# Patient Record
Sex: Female | Born: 1965 | Race: Black or African American | Hispanic: No | Marital: Single | Smoking: Never smoker
Health system: Southern US, Community
[De-identification: ages and names within clinical notes are randomized; demographics above are authoritative.]

## PROBLEM LIST (undated history)

## (undated) DIAGNOSIS — R200 Anesthesia of skin: Secondary | ICD-10-CM

## (undated) DIAGNOSIS — R51 Headache: Secondary | ICD-10-CM

## (undated) DIAGNOSIS — K222 Esophageal obstruction: Secondary | ICD-10-CM

## (undated) DIAGNOSIS — J40 Bronchitis, not specified as acute or chronic: Secondary | ICD-10-CM

## (undated) DIAGNOSIS — K449 Diaphragmatic hernia without obstruction or gangrene: Secondary | ICD-10-CM

## (undated) DIAGNOSIS — K644 Residual hemorrhoidal skin tags: Secondary | ICD-10-CM

## (undated) DIAGNOSIS — I10 Essential (primary) hypertension: Secondary | ICD-10-CM

## (undated) DIAGNOSIS — M1990 Unspecified osteoarthritis, unspecified site: Secondary | ICD-10-CM

## (undated) DIAGNOSIS — R202 Paresthesia of skin: Secondary | ICD-10-CM

## (undated) DIAGNOSIS — N189 Chronic kidney disease, unspecified: Secondary | ICD-10-CM

## (undated) DIAGNOSIS — E119 Type 2 diabetes mellitus without complications: Secondary | ICD-10-CM

## (undated) DIAGNOSIS — R519 Headache, unspecified: Secondary | ICD-10-CM

## (undated) DIAGNOSIS — K219 Gastro-esophageal reflux disease without esophagitis: Secondary | ICD-10-CM

## (undated) DIAGNOSIS — D509 Iron deficiency anemia, unspecified: Secondary | ICD-10-CM

## (undated) HISTORY — DX: Iron deficiency anemia, unspecified: D50.9

## (undated) HISTORY — DX: Esophageal obstruction: K22.2

## (undated) HISTORY — PX: CHOLECYSTECTOMY: SHX55

## (undated) HISTORY — DX: Headache, unspecified: R51.9

## (undated) HISTORY — DX: Chronic kidney disease, unspecified: N18.9

## (undated) HISTORY — DX: Diaphragmatic hernia without obstruction or gangrene: K44.9

## (undated) HISTORY — DX: Anesthesia of skin: R20.2

## (undated) HISTORY — PX: TUBAL LIGATION: SHX77

## (undated) HISTORY — DX: Headache: R51

## (undated) HISTORY — DX: Residual hemorrhoidal skin tags: K64.4

## (undated) HISTORY — DX: Anesthesia of skin: R20.0

## (undated) HISTORY — PX: ROTATOR CUFF REPAIR: SHX139

---
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
## 1998-04-28 ENCOUNTER — Emergency Department (HOSPITAL_COMMUNITY): Admission: EM | Admit: 1998-04-28 | Discharge: 1998-04-28 | Payer: Self-pay | Admitting: Emergency Medicine

## 1998-04-28 ENCOUNTER — Encounter: Payer: Self-pay | Admitting: Emergency Medicine

## 1999-07-20 ENCOUNTER — Encounter: Payer: Self-pay | Admitting: Emergency Medicine

## 1999-07-20 ENCOUNTER — Emergency Department (HOSPITAL_COMMUNITY): Admission: EM | Admit: 1999-07-20 | Discharge: 1999-07-20 | Payer: Self-pay | Admitting: Emergency Medicine

## 2000-07-25 ENCOUNTER — Encounter (INDEPENDENT_AMBULATORY_CARE_PROVIDER_SITE_OTHER): Payer: Self-pay | Admitting: Specialist

## 2000-07-25 ENCOUNTER — Encounter: Admission: RE | Admit: 2000-07-25 | Discharge: 2000-07-25 | Payer: Self-pay | Admitting: Obstetrics and Gynecology

## 2000-09-08 ENCOUNTER — Inpatient Hospital Stay (HOSPITAL_COMMUNITY): Admission: AD | Admit: 2000-09-08 | Discharge: 2000-09-08 | Payer: Self-pay | Admitting: Obstetrics and Gynecology

## 2000-09-17 ENCOUNTER — Inpatient Hospital Stay (HOSPITAL_COMMUNITY): Admission: AD | Admit: 2000-09-17 | Discharge: 2000-09-17 | Payer: Self-pay | Admitting: Obstetrics and Gynecology

## 2000-09-22 ENCOUNTER — Inpatient Hospital Stay (HOSPITAL_COMMUNITY): Admission: AD | Admit: 2000-09-22 | Discharge: 2000-09-26 | Payer: Self-pay | Admitting: Obstetrics and Gynecology

## 2000-11-12 ENCOUNTER — Other Ambulatory Visit: Admission: RE | Admit: 2000-11-12 | Discharge: 2000-11-12 | Payer: Self-pay | Admitting: Obstetrics and Gynecology

## 2002-11-07 ENCOUNTER — Emergency Department (HOSPITAL_COMMUNITY): Admission: EM | Admit: 2002-11-07 | Discharge: 2002-11-07 | Payer: Self-pay | Admitting: Emergency Medicine

## 2003-08-25 ENCOUNTER — Ambulatory Visit (HOSPITAL_COMMUNITY): Admission: RE | Admit: 2003-08-25 | Discharge: 2003-08-25 | Payer: Self-pay | Admitting: Family Medicine

## 2006-06-03 ENCOUNTER — Ambulatory Visit (HOSPITAL_COMMUNITY): Admission: RE | Admit: 2006-06-03 | Discharge: 2006-06-03 | Payer: Self-pay | Admitting: Family Medicine

## 2006-06-12 ENCOUNTER — Encounter: Admission: RE | Admit: 2006-06-12 | Discharge: 2006-06-12 | Payer: Self-pay | Admitting: Gastroenterology

## 2010-01-07 ENCOUNTER — Emergency Department (HOSPITAL_COMMUNITY): Admission: EM | Admit: 2010-01-07 | Discharge: 2010-01-08 | Payer: Self-pay | Admitting: Emergency Medicine

## 2010-05-01 LAB — COMPREHENSIVE METABOLIC PANEL
ALT: 18 U/L (ref 0–35)
AST: 28 U/L (ref 0–37)
Albumin: 3.8 g/dL (ref 3.5–5.2)
Alkaline Phosphatase: 69 U/L (ref 39–117)
BUN: 8 mg/dL (ref 6–23)
CO2: 26 mEq/L (ref 19–32)
Calcium: 9 mg/dL (ref 8.4–10.5)
Chloride: 106 mEq/L (ref 96–112)
Creatinine, Ser: 1.26 mg/dL — ABNORMAL HIGH (ref 0.4–1.2)
GFR calc Af Amer: 56 mL/min — ABNORMAL LOW (ref >60)
GFR calc non Af Amer: 46 mL/min — ABNORMAL LOW (ref >60)
Glucose, Bld: 117 mg/dL — ABNORMAL HIGH (ref 70–99)
Potassium: 4.1 mEq/L (ref 3.5–5.1)
Sodium: 139 mEq/L (ref 135–145)
Total Bilirubin: 0.5 mg/dL (ref 0.3–1.2)
Total Protein: 7.4 g/dL (ref 6.0–8.3)

## 2010-05-01 LAB — URINALYSIS, ROUTINE W REFLEX MICROSCOPIC
Glucose, UA: NEGATIVE mg/dL
Hgb urine dipstick: NEGATIVE
Ketones, ur: 15 mg/dL — AB
Nitrite: NEGATIVE
Protein, ur: NEGATIVE mg/dL
Specific Gravity, Urine: 1.027 (ref 1.005–1.030)
Urobilinogen, UA: 1 mg/dL (ref 0.0–1.0)
pH: 6 (ref 5.0–8.0)

## 2010-05-01 LAB — DIFFERENTIAL
Basophils Absolute: 0 10*3/uL (ref 0.0–0.1)
Basophils Relative: 0 % (ref 0–1)
Eosinophils Absolute: 0.1 10*3/uL (ref 0.0–0.7)
Eosinophils Relative: 1 % (ref 0–5)
Lymphocytes Relative: 25 % (ref 12–46)
Lymphs Abs: 2.2 10*3/uL (ref 0.7–4.0)
Monocytes Absolute: 0.6 10*3/uL (ref 0.1–1.0)
Monocytes Relative: 7 % (ref 3–12)
Neutro Abs: 5.9 10*3/uL (ref 1.7–7.7)
Neutrophils Relative %: 67 % (ref 43–77)

## 2010-05-01 LAB — CBC
HCT: 39.9 % (ref 36.0–46.0)
Hemoglobin: 12.7 g/dL (ref 12.0–15.0)
MCH: 25.3 pg — ABNORMAL LOW (ref 26.0–34.0)
MCHC: 31.8 g/dL (ref 30.0–36.0)
MCV: 79.6 fL (ref 78.0–100.0)
Platelets: 270 10*3/uL (ref 150–400)
RBC: 5.01 MIL/uL (ref 3.87–5.11)
RDW: 14.5 % (ref 11.5–15.5)
WBC: 8.9 10*3/uL (ref 4.0–10.5)

## 2010-05-01 LAB — POCT PREGNANCY, URINE: Preg Test, Ur: NEGATIVE

## 2010-05-01 LAB — LIPASE, BLOOD: Lipase: 20 U/L (ref 11–59)

## 2010-07-06 NOTE — Discharge Summary (Signed)
[facility]  Patient:    Gabriela Lowe, Gabriela Lowe                    MRN: [id number] Adm. Date:  [id number] Disc. Date: [id number] Attending:  Rhina Brackett Dictator:   Danie Chandler, R.N.                           Discharge Summary  ADMITTING DIAGNOSES:          1. A 37-5/[redacted] week gestation.                               2. Previous cesarean section.                               3. Declines vaginal birth after cesarean.                               4. Labor.                               5. Requests tubal ligation.  DISCHARGE DIAGNOSES:          1. A 37-5/[redacted] week gestation.                               2. Previous cesarean section.                               3. Declines vaginal birth after cesarean.                               4. Labor.                               5. Requests tubal ligation.  PROCEDURE ON AUGUST 5,2002:   1. Repeat low transverse cesarean section.                               2. Modified Pomeroy tubal ligation.  REASON FOR ADMISSION:         Please see history and physical.  HOSPITAL COURSE:              The patient was taken to the operating room and underwent the above-named procedure without complication. This was productive of a viable female infant with Apgars of 9 at one minute and 9 at five minutes.  Postoperatively on day #1 the patient had good control of pain. Her hemoglobin was 9.9, hematocrit 28.6, and white blood cell count 9.7, and platelets were 147,000.  On postoperative day #2 the patient had a good return of bowel function, was tolerating a regular diet. Her CBGs were basically within normal limits. The baby was in NICU on antibiotics and stable.  On postoperative day #3 the patient had better pain control. Her CBGs remained basically within normal limits and she was discharged home on postoperative day #4.  CONDITION ON DISCHARGE:  Good.  DISCHARGE INSTRUCTIONS:       Diet: Regular as  tolerated. Activity: No heavy lifting, no driving, no vaginal entry.  DISCHARGE FOLLOWUP:           She is to follow up in the office in one to two weeks for incision check and she is to call for temperature greater than 100 degrees, persistent nausea or vomiting, heavy vaginal bleeding, and/or redness or drainage from the incision site.  DISCHARGE MEDICATIONS:        1. Prenatal vitamin one p.o. q.d.                               2. Pain medications as directed by M.D. DD:  09/26/00 TD:  09/26/00 Job: [id number]

## 2010-07-06 NOTE — Op Note (Signed)
[facility]  Patient:    Gabriela Lowe, Gabriela Lowe                    MRN: [id number] Proc. Date: 09/22/00 Attending:  Frederich Balding                           Operative Report  PREOPERATIVE DIAGNOSES:       1. A 37-5/[redacted] weeks gestation.                               2. Previous cesarean section.                               3. Declines vaginal birth after cesarean.                               4. Labor.                               5. Requests tubal ligation.  POSTOPERATIVE DIAGNOSES:      1. A 37-5/[redacted] weeks gestation.                               2. Previous cesarean section.                               3. Declines vaginal birth after cesarean.                               4. Labor.                               5. Requests tubal ligation.  PROCEDURE:                    1. Repeat low transverse cesarean section.                               2. Modified Pomeroy tubal ligation.  SURGEON:                      Duke Salvia. Marcelle Overlie, M.D.  ANESTHESIA:                   Spinal.  COMPLICATIONS:                None.  DRAINS:                       Foley catheter.  ESTIMATED BLOOD LOSS:         800 cc.  PROCEDURE AND FINDINGS:       The patient was taken to the operating room. After an adequate level of spinal anesthetic was obtained, with the patient supine in left tilt position, the abdomen was prepped and draped in the usual manner for sterile abdominal procedures. A transverse incision was made through the old scar which was well healed. A Foley  catheter had been positioned previously. This was carried down to the fascia which was incised then extended transversely. Rectus muscles were divided in the midline, peritoneum entered superiorly without incident, and extended in a vertical manner. The vesicouterine serosa was then incised and the bladder was bluntly and sharply dissected off of the lower uterine segment. The bladder blade  was positioned. A transverse incision was made in the lower uterine segment, extended with blunt dissection, clear fluid noted, and the patient then delivered of a 9-pound 2-ounce female, Apgars 9 and 9. The infant was suctioned, the cord clamped, and the infant was passed to the pediatric team for further care. The placenta delivered manually, intact. The uterus was exteriorized and the cavity wiped clean with laparotomy pack. Closure obtained with a first layer of 0 chromic in a locked fashion followed by an imbricating layer of 0 chromic. This was hemostatic. Tubes and ovaries appeared to be normal. Starting on the right, the right tube was grasped in mid segment with a Babcock clamp and o plain sutures were then tied around each end of a mid segment knuckle of tube which was then excised. Cut ends were cauterized with a Bovie. Sutures of 2-0 silk were placed around the proximal segment. This was hemostatic. The exact same was repeated on the opposite side. Prior to closure, sponge, needle, and instrument counts were reported as correct x 2. The rectus muscles were reapproximated with 2-0 Dexon interrupted sutures. The fascia was closed from laterally to midline on either side with a 0 PDS suture. Subcutaneous fat was hemostatic. Clips and Steri-Strips were applied and pressure dressing applied. She did received Pitocin IV and Ancef 1 g IV after the cord was clamped. Clear urine was noted at the end of the case. DD:  09/22/00 TD:  09/23/00 Job: [id number]

## 2010-07-06 NOTE — H&P (Signed)
[facility]  Patient:    Gabriela Lowe, Gabriela Lowe                        MRN: [id number] Adm. Date:  09/22/00 Attending:  Duke Salvia. Marcelle Overlie, M.D.                         History and Physical  CHIEF COMPLAINT:              Labor.  HISTORY OF PRESENT ILLNESS:   A 45 year old, G3, P1-0-1-1, scheduled for repeat cesarean section presents in early labor today. She is extremely uncomfortable. She declines VBAC. She is admitted now for repeat cesarean section and tubal ligation. This procedure, including risks relative to bleeding, infection, adjacent organ injury, the possible need for transfusion, reviewed. The permanence of the tubal procedure and failure rate of 2 to 3 per 1000 discussed with her. This pregnancy has been complicated by increased Trisomy 21 risk based on an AFP. She declined amniocentesis. The level II ultrasound at [facility] did not show any increased Trisomy 21 markers. She does have a history of chronic hypertension and ______ blood pressure was 140/80. She has been on Aldomet 250 mg p.o. b.i.d. Her one-hour GTT was abnormal. Group B strep screen was negative. Three-hour GTT showed a normal fasting of 102, one hour was 250, two hours was 195, and three hour was 86 consistent with gestational diabetes. She has been a low carbohydrate diet and her blood sugar control has been reasonably normal without insulin.  PAST MEDICAL HISTORY:  ALLERGIES:                    None.  OPERATIONS:                   Cesarean section 1992, 9-pound 1-ounce female.  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  PHYSICAL EXAMINATION:  VITAL SIGNS:                  Temperature 98.2, blood pressure 140/72.  HEENT:                        Unremarkable.  NECK:                         Supple without masses.  LUNGS:                        Clear.  CARDIOVASCULAR:               Regular rate and  rhythm without murmurs, rubs, gallops.  BREASTS:                      Not examined.  ABDOMEN:                      Term fundal height. Fetal heart rate 140.  PELVIC:                       Cervix was less than 1 cm.  Membranes intact.  EXTREMITIES:                  Unremarkable.  NEUROLOGIC:  Unremarkable.  IMPRESSION:                   1. A 37-5/[redacted] week gestation.                               2. Early active labor.                               3. Previous cesarean section, declines vaginal                                  birth after cesarean section.  PLAN:                         Repeat cesarean section and tubal ligation. Procedure and risks discussed as above. DD:  09/22/00 TD:  09/22/00 Job: [id number]

## 2011-11-01 ENCOUNTER — Emergency Department (HOSPITAL_COMMUNITY)
Admission: EM | Admit: 2011-11-01 | Discharge: 2011-11-01 | Disposition: A | Discharge status: Home / Self Care | Payer: Medicaid Other | Attending: Emergency Medicine | Admitting: Emergency Medicine

## 2011-11-01 ENCOUNTER — Encounter (HOSPITAL_COMMUNITY): Payer: Self-pay | Admitting: Emergency Medicine

## 2011-11-01 DIAGNOSIS — I10 Essential (primary) hypertension: Secondary | ICD-10-CM | POA: Insufficient documentation

## 2011-11-01 DIAGNOSIS — M25579 Pain in unspecified ankle and joints of unspecified foot: Secondary | ICD-10-CM | POA: Insufficient documentation

## 2011-11-01 HISTORY — DX: Essential (primary) hypertension: I10

## 2011-11-01 MED ORDER — IBUPROFEN 600 MG PO TABS: 600.0000 mg | ORAL_TABLET | Freq: Four times a day (QID) | ORAL | Status: AC | PRN

## 2011-11-01 NOTE — ED Notes (Signed)
Pt reports 24 hr hx of ankle swelling and pain

## 2011-11-01 NOTE — ED Provider Notes (Signed)
History     CSN: [id number]  Arrival date & time 11/01/11  0932   First MD Initiated Contact with Patient 11/01/11 [phone]      Chief Complaint  Patient presents with  . Ankle Pain    pt c/o tenderness and swelling in l/ankle. denies trauma    (Consider location/radiation/quality/duration/timing/severity/associated sxs/prior treatment) HPI Comments: Patient presents with complaint of left heel and ankle pain for the past 2 days. 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 There is no injury at the onset. Pain is gradually becoming worse. Pain is made worse with walking and palpation. Pain does not radiate. Patient denies knee pain. She's been taking Tylenol at home and soaking her foot in warm water without relief. Onset was gradual. Course is constant.  Patient is a 46 y.o. female presenting with ankle pain. The history is provided by the patient.  Ankle Pain  The incident occurred 2 days ago. There was no injury mechanism. Pain location: left ankle. The pain is moderate. The pain has been constant since onset. Pertinent negatives include no numbness, no inability to bear weight, no loss of motion, no loss of sensation and no tingling. She reports no foreign bodies present. The symptoms are aggravated by bearing weight. She has tried acetaminophen for the symptoms. The treatment provided no relief.    Past Medical History  Diagnosis Date  . Hypertension     Past Surgical History  Procedure Date  . Cesarean section   . Cholecystectomy     No family history on file.  History  Substance Use Topics  . Smoking status: Not on file  . Smokeless tobacco: Not on file  . Alcohol Use: Not on file    OB History    No data available      Review of Systems  Constitutional: Negative for activity change.  HENT: Negative for neck pain.   Musculoskeletal: Positive for arthralgias and gait problem. Negative for back pain and joint  swelling.  Skin: Negative for wound.  Neurological: Negative for tingling, weakness and numbness.    Allergies  Review of patient's allergies indicates no known allergies.  Home Medications   Current Outpatient Rx  Name Route Sig Dispense Refill  . ACETAMINOPHEN 500 MG PO TABS Oral Take 1,000 mg by mouth every 6 (six) hours as needed. For pain.    Marland Kitchen LISINOPRIL-HYDROCHLOROTHIAZIDE 20-25 MG PO TABS Oral Take 1 tablet by mouth daily.    . ADULT MULTIVITAMIN W/MINERALS CH Oral Take 1 tablet by mouth every other day.      BP 158/82  Pulse 72  Temp 98.1 F (36.7 C) (Oral)  Resp 18  SpO2 100%  LMP 10/17/2011  Physical Exam  Nursing note and vitals reviewed. Constitutional: She appears well-developed and well-nourished.  HENT:  Head: Normocephalic and atraumatic.  Eyes: Pupils are equal, round, and reactive to light.  Neck: Normal range of motion. Neck supple.  Cardiovascular: Exam reveals no decreased pulses.   Pulses:      Dorsalis pedis pulses are 2+ on the right side, and 2+ on the left side.       Posterior tibial pulses are 2+ on the right side, and 2+ on the left side.  Musculoskeletal: She exhibits tenderness. She exhibits no edema.       Left ankle: She exhibits normal range of motion and no swelling. tenderness (heel, posterior distal ankle). Achilles tendon exhibits pain. Achilles  tendon exhibits no defect and normal Thompson's test results.       Patient with tenderness to palpation along the Achilles tendon as well as soft tissues to either side of the tendon.  Neurological: She is alert. No sensory deficit.       Motor, sensation, and vascular distal to the injury is fully intact.   Skin: Skin is warm and dry.  Psychiatric: She has a normal mood and affect.    ED Course  Procedures (including critical care time)  Labs Reviewed - No data to display No results found.   1. Ankle pain     10:19 AM Patient seen and examined.   Vital signs reviewed and are as  follows: Filed Vitals:   11/01/11 1050  BP: 158/82  Pulse:   Temp:   Resp:   BP 158/82  Pulse 72  Temp 98.1 F (36.7 C) (Oral)  Resp 18  SpO2 100%  LMP 10/17/2011   Patient was counseled on RICE protocol and told to rest injury, use ice for no longer than 15 minutes every hour, compress the area, and elevate above the level of their heart as much as possible to reduce swelling.  Questions answered.  Patient verbalized understanding.    Crutches and Ace wrap by ortho tech.  MDM  Patient with ankle pain, no injury. No x-rays ordered because of no significant mechanism of injury. Achilles tendon appears intact. Pain extends medially and laterally from achilles. Suspect inflammation secondary to the patient being up on her feet all day. She is also overweight. RICE protocol indicated for treatment. Will give crutches and ACE wrap per patient request.        Renne Crigler, PA 11/01/11 1343

## 2011-11-01 NOTE — ED Notes (Signed)
Crutch walking reviewed with pt Proper use of ace bandage reviewed

## 2011-11-02 NOTE — ED Provider Notes (Signed)
Medical screening examination/treatment/procedure(s) were performed by non-physician practitioner and as supervising physician I was immediately available for consultation/collaboration.   Kynsley Whitehouse Y. Charnita Trudel, MD 11/02/11 0004 

## 2012-01-24 ENCOUNTER — Other Ambulatory Visit (HOSPITAL_COMMUNITY): Payer: Self-pay | Admitting: Physician Assistant

## 2012-01-24 DIAGNOSIS — Z1231 Encounter for screening mammogram for malignant neoplasm of breast: Secondary | ICD-10-CM

## 2012-02-14 ENCOUNTER — Ambulatory Visit (HOSPITAL_COMMUNITY): Payer: Medicaid Other

## 2012-03-04 ENCOUNTER — Ambulatory Visit (HOSPITAL_COMMUNITY)
Admission: RE | Admit: 2012-03-04 | Discharge: 2012-03-04 | Disposition: A | Discharge status: Home / Self Care | Payer: Medicaid Other | Source: Ambulatory Visit | Attending: Physician Assistant | Admitting: Physician Assistant

## 2012-03-04 DIAGNOSIS — Z1231 Encounter for screening mammogram for malignant neoplasm of breast: Secondary | ICD-10-CM

## 2012-03-06 ENCOUNTER — Other Ambulatory Visit: Payer: Self-pay | Admitting: Physician Assistant

## 2012-03-06 DIAGNOSIS — R928 Other abnormal and inconclusive findings on diagnostic imaging of breast: Secondary | ICD-10-CM

## 2012-03-20 ENCOUNTER — Ambulatory Visit
Admission: RE | Admit: 2012-03-20 | Discharge: 2012-03-20 | Disposition: A | Discharge status: Home / Self Care | Payer: Medicaid Other | Source: Ambulatory Visit | Attending: Physician Assistant | Admitting: Physician Assistant

## 2012-03-20 DIAGNOSIS — R928 Other abnormal and inconclusive findings on diagnostic imaging of breast: Secondary | ICD-10-CM

## 2012-05-01 ENCOUNTER — Ambulatory Visit: Payer: Medicaid Other | Attending: Sports Medicine | Admitting: Physical Therapy

## 2012-05-01 DIAGNOSIS — M25579 Pain in unspecified ankle and joints of unspecified foot: Secondary | ICD-10-CM | POA: Insufficient documentation

## 2012-05-01 DIAGNOSIS — R262 Difficulty in walking, not elsewhere classified: Secondary | ICD-10-CM | POA: Insufficient documentation

## 2012-05-01 DIAGNOSIS — IMO0001 Reserved for inherently not codable concepts without codable children: Secondary | ICD-10-CM | POA: Insufficient documentation

## 2012-05-11 ENCOUNTER — Ambulatory Visit: Payer: Medicaid Other | Admitting: Rehabilitation

## 2012-05-14 ENCOUNTER — Ambulatory Visit: Payer: Medicaid Other | Admitting: Rehabilitation

## 2012-05-19 ENCOUNTER — Ambulatory Visit: Payer: Medicaid Other | Admitting: Rehabilitation

## 2012-08-20 ENCOUNTER — Emergency Department (HOSPITAL_COMMUNITY)
Admission: EM | Admit: 2012-08-20 | Discharge: 2012-08-20 | Disposition: A | Discharge status: Home / Self Care | Payer: Medicaid Other | Source: Home / Self Care | Attending: Emergency Medicine | Admitting: Emergency Medicine

## 2012-08-20 ENCOUNTER — Encounter (HOSPITAL_COMMUNITY): Payer: Self-pay | Admitting: Emergency Medicine

## 2012-08-20 ENCOUNTER — Emergency Department (INDEPENDENT_AMBULATORY_CARE_PROVIDER_SITE_OTHER): Payer: Medicaid Other

## 2012-08-20 DIAGNOSIS — J40 Bronchitis, not specified as acute or chronic: Secondary | ICD-10-CM

## 2012-08-20 DIAGNOSIS — J309 Allergic rhinitis, unspecified: Secondary | ICD-10-CM

## 2012-08-20 MED ORDER — CHLORPHENIRAMINE-PSE-IBUPROFEN 2-30-200 MG PO TABS: ORAL_TABLET | ORAL | Status: DC

## 2012-08-20 MED ORDER — HYDROCOD POLST-CHLORPHEN POLST 10-8 MG/5ML PO LQCR: 5.0000 mL | Freq: Two times a day (BID) | ORAL | Status: DC | PRN

## 2012-08-20 MED ORDER — ANTIPYRINE-BENZOCAINE 5.4-1.4 % OT SOLN: 3.0000 [drp] | OTIC | Status: DC | PRN

## 2012-08-20 MED ORDER — METHYLPREDNISOLONE 4 MG PO KIT: PACK | ORAL | Status: DC

## 2012-08-20 NOTE — ED Notes (Signed)
Waiting for work note

## 2012-08-20 NOTE — ED Provider Notes (Signed)
Medical screening examination/treatment/procedure(s) were performed by non-physician practitioner and as supervising physician I was immediately available for consultation/collaboration.  Leslee Home, M.D.  Reuben Likes, MD 08/20/12 [phone]

## 2012-08-20 NOTE — ED Notes (Signed)
Cough for 2 weeks, upper back pain, body aches, and left ear ache.  Symptoms for 2 weeks .

## 2012-08-20 NOTE — ED Provider Notes (Signed)
History    CSN: [id number] Arrival date & time 08/20/12  4132  First MD Initiated Contact with Patient 08/20/12 0920     Chief Complaint  Patient presents with  . Cough   (Consider location/radiation/quality/duration/timing/severity/associated sxs/prior Treatment) HPI Comments: 47 year old female presents to the urgent care complaining of cough for 2 weeks, body aches, and bilateral ear ache that is worse in the left ear. This began initially as a sore throat followed by nasal congestion and feeling very rundown. These symptoms have resolved, but about 10 days ago the cough started, followed soon by the body aches and the ear ache. The body aches are described as pain in her upper back and her legs, much worse whenever she coughs. The ear ache is described as a pressure that is worse when she lays down at night. She has not had any fever, chills, rash, or sick contacts. She denies recent travel.  Patient is a 47 y.o. female presenting with cough.  Cough Associated symptoms: ear pain, myalgias, rhinorrhea and sore throat   Associated symptoms: no chest pain, no chills, no diaphoresis, no eye discharge, no fever, no headaches, no rash and no shortness of breath    Past Medical History  Diagnosis Date  . Hypertension    Past Surgical History  Procedure Laterality Date  . Cesarean section    . Cholecystectomy     Family History  Problem Relation Age of Onset  . Hypertension Mother   . Diabetes Mother    History  Substance Use Topics  . Smoking status: Never Smoker   . Smokeless tobacco: Not on file  . Alcohol Use: No   OB History   Grav Para Term Preterm Abortions TAB SAB Ect Mult Living                 Review of Systems  Constitutional: Positive for fatigue. Negative for fever, chills and diaphoresis.  HENT: Positive for ear pain, congestion, sore throat, rhinorrhea, postnasal drip and sinus pressure. Negative for hearing loss, sneezing, trouble swallowing, neck pain, neck  stiffness, tinnitus and ear discharge.   Eyes: Negative for pain, discharge and visual disturbance.  Respiratory: Positive for cough. Negative for chest tightness and shortness of breath.   Cardiovascular: Negative for chest pain, palpitations and leg swelling.  Gastrointestinal: Negative for nausea, vomiting, abdominal pain, diarrhea, constipation and blood in stool.  Endocrine: Negative for polydipsia and polyuria.  Genitourinary: Negative for dysuria, urgency and frequency.  Musculoskeletal: Positive for myalgias. Negative for arthralgias.  Skin: Negative for rash.  Neurological: Negative for dizziness, weakness, light-headedness and headaches.    Allergies  Review of patient's allergies indicates no known allergies.  Home Medications   Current Outpatient Rx  Name  Route  Sig  Dispense  Refill  . acetaminophen (TYLENOL) 500 MG tablet   Oral   Take 1,000 mg by mouth every 6 (six) hours as needed. For pain.         Marland Kitchen antipyrine-benzocaine (AURALGAN) otic solution   Both Ears   Place 3 drops into both ears every 2 (two) hours as needed for pain.   10 mL   1   . chlorpheniramine-HYDROcodone (TUSSIONEX PENNKINETIC ER) 10-8 MG/5ML LQCR   Oral   Take 5 mLs by mouth every 12 (twelve) hours as needed.   115 mL   0   . Chlorpheniramine-PSE-Ibuprofen (ADVIL ALLERGY SINUS) 2-30-200 MG TABS      2 tabs every 6 hours when necessary   84 each  0   . lisinopril-hydrochlorothiazide (PRINZIDE,ZESTORETIC) 20-25 MG per tablet   Oral   Take 1 tablet by mouth daily.         . methylPREDNISolone (MEDROL DOSEPAK) 4 MG tablet      Use taper dose pack as directed   21 tablet   0     Dispense as written.   . Multiple Vitamin (MULTIVITAMIN WITH MINERALS) TABS   Oral   Take 1 tablet by mouth every other day.          BP 149/79  Pulse 62  Temp(Src) 98.3 F (36.8 C) (Oral)  Resp 16  SpO2 100%  LMP 08/10/2012 Physical Exam  Nursing note and vitals  reviewed. Constitutional: She is oriented to person, place, and time. Vital signs are normal. She appears well-developed and well-nourished. No distress.  HENT:  Head: Atraumatic.  Mouth/Throat: Uvula is midline and oropharynx is clear and moist. No oral lesions. No edematous. No oropharyngeal exudate, posterior oropharyngeal edema or posterior oropharyngeal erythema.  Cobblestoning in posterior oropharynx  Eyes: EOM are normal. Pupils are equal, round, and reactive to light.  Cardiovascular: Normal rate and regular rhythm.  Exam reveals no gallop and no friction rub.   Murmur heard.  Decrescendo systolic murmur is present with a grade of 1/6  Very slight 1/6 systolic ejection murmur that terminates well before S2  Pulmonary/Chest: Effort normal and breath sounds normal. No respiratory distress. She has no wheezes. She has no rales.  Abdominal: Soft. There is no tenderness.  Lymphadenopathy:       Head (right side): No submandibular and no tonsillar adenopathy present.       Head (left side): No submandibular and no tonsillar adenopathy present.    She has no cervical adenopathy.  Neurological: She is alert and oriented to person, place, and time. She has normal strength.  Skin: Skin is warm and dry. She is not diaphoretic.  Psychiatric: She has a normal mood and affect. Her behavior is normal. Judgment normal.    ED Course  Procedures (including critical care time) Labs Reviewed - No data to display Dg Chest 2 View  08/20/2012   *RADIOLOGY REPORT*  Clinical Data: Cough  CHEST - 2 VIEW  Comparison: None.  Findings: Lungs clear.  Heart is upper normal in size with normal pulmonary vascularity.  No adenopathy.  No pneumothorax.  No bone lesions.  IMPRESSION: No edema or consolidation.   Original Report Authenticated By: Bretta Bang, M.D.   1. Bronchitis   2. Allergic rhinitis     MDM  There is no radiographic evidence of pulmonary disease. We'll treat this as a simple bronchitis  following an upper respiratory infection. She will followup if she does not improve.   Meds ordered this encounter  Medications  . methylPREDNISolone (MEDROL DOSEPAK) 4 MG tablet    Sig: Use taper dose pack as directed    Dispense:  21 tablet    Refill:  0  . chlorpheniramine-HYDROcodone (TUSSIONEX PENNKINETIC ER) 10-8 MG/5ML LQCR    Sig: Take 5 mLs by mouth every 12 (twelve) hours as needed.    Dispense:  115 mL    Refill:  0  . antipyrine-benzocaine (AURALGAN) otic solution    Sig: Place 3 drops into both ears every 2 (two) hours as needed for pain.    Dispense:  10 mL    Refill:  1  . Chlorpheniramine-PSE-Ibuprofen (ADVIL ALLERGY SINUS) 2-30-200 MG TABS    Sig: 2 tabs every 6 hours when  necessary    Dispense:  84 each    Refill:  0     Graylon Good, PA-C 08/20/12 1037

## 2012-08-25 ENCOUNTER — Emergency Department (HOSPITAL_COMMUNITY)
Admission: EM | Admit: 2012-08-25 | Discharge: 2012-08-25 | Disposition: A | Discharge status: Home / Self Care | Payer: Medicaid Other | Source: Home / Self Care | Attending: Emergency Medicine | Admitting: Emergency Medicine

## 2012-08-25 ENCOUNTER — Encounter (HOSPITAL_COMMUNITY): Payer: Self-pay | Admitting: *Deleted

## 2012-08-25 DIAGNOSIS — R0982 Postnasal drip: Secondary | ICD-10-CM

## 2012-08-25 DIAGNOSIS — R05 Cough: Secondary | ICD-10-CM

## 2012-08-25 DIAGNOSIS — R059 Cough, unspecified: Secondary | ICD-10-CM

## 2012-08-25 DIAGNOSIS — H6120 Impacted cerumen, unspecified ear: Secondary | ICD-10-CM

## 2012-08-25 DIAGNOSIS — H6122 Impacted cerumen, left ear: Secondary | ICD-10-CM

## 2012-08-25 MED ORDER — CETIRIZINE-PSEUDOEPHEDRINE ER 5-120 MG PO TB12: 1.0000 | ORAL_TABLET | Freq: Every day | ORAL | Status: DC

## 2012-08-25 MED ORDER — BENZONATATE 100 MG PO CAPS: 100.0000 mg | ORAL_CAPSULE | Freq: Three times a day (TID) | ORAL | Status: DC | PRN

## 2012-08-25 MED ORDER — CETIRIZINE-PSEUDOEPHEDRINE ER 5-120 MG PO TB12: 1.0000 | ORAL_TABLET | Freq: Two times a day (BID) | ORAL | Status: AC

## 2012-08-25 MED ORDER — NEOMYCIN-POLYMYXIN-HC 3.5-10000-1 OT SOLN: 3.0000 [drp] | Freq: Four times a day (QID) | OTIC | Status: AC

## 2012-08-25 NOTE — ED Notes (Addendum)
C/o sore throat 3 weeks ago, then got pressure in her face and ears, bodyaches, cough and dizziness.  States her back and ribs hurt from coughing.  No fever.  Syncope 7/4 at work.  She got hot and flushed and sweaty.  She stopped taking her steroid and cough medicine.

## 2012-08-25 NOTE — ED Provider Notes (Addendum)
History    CSN: [id number] Arrival date & time 08/25/12  1722  First MD Initiated Contact with Patient 08/25/12 1828     Chief Complaint  Patient presents with  . Ear Fullness  . Dizziness  . Cough   (Consider location/radiation/quality/duration/timing/severity/associated sxs/prior Treatment) HPI Comments: Patient presents urgent care this evening describing that she has had a sore throat and has had some sinus pressure as well as discomfort from both of her ears predominantly the left 1 also some body aches at times have felt dizzy. Patient describes that her back and ribs have been hurting for some is coughing. She said that the day after she was seen here and was prescribed certain medicines for her cough "she  passed out", patient decided to stop both her steroid and her cough medicine that was previously prescribed.   She still has some cough and discomfort but is here tonight because her discomfort and pressure from her left ear has gotten worse. She denies any shortness of breath or chest pain at rest. Still has some cough but has not taking any more of the previously prescribed medicine denies any fevers  Patient is a 47 y.o. female presenting with plugged ear sensation and cough. The history is provided by the patient.  Ear Fullness This is a recurrent problem. The current episode started more than 1 week ago. The problem occurs constantly. The problem has not changed since onset.Pertinent negatives include no abdominal pain, no headaches and no shortness of breath. Nothing aggravates the symptoms. She has tried nothing for the symptoms. The treatment provided no relief.  Cough Associated symptoms: ear pain and rhinorrhea   Associated symptoms: no diaphoresis, no fever, no headaches, no shortness of breath and no sore throat    Past Medical History  Diagnosis Date  . Hypertension    Past Surgical History  Procedure Laterality Date  . Cesarean section    . Cholecystectomy       Family History  Problem Relation Age of Onset  . Hypertension Mother   . Diabetes Mother   . Diabetes Father   . Diabetes Brother    History  Substance Use Topics  . Smoking status: Never Smoker   . Smokeless tobacco: Not on file  . Alcohol Use: No   OB History   Grav Para Term Preterm Abortions TAB SAB Ect Mult Living                 Review of Systems  Constitutional: Negative for fever, diaphoresis, activity change, appetite change and fatigue.  HENT: Positive for ear pain, congestion, rhinorrhea, postnasal drip and sinus pressure. Negative for hearing loss, nosebleeds, sore throat, facial swelling, mouth sores, trouble swallowing, neck pain, neck stiffness, voice change and tinnitus.   Eyes: Negative for photophobia, pain and redness.  Respiratory: Positive for cough. Negative for shortness of breath.   Gastrointestinal: Negative for vomiting and abdominal pain.  Skin: Negative for color change and pallor.  Allergic/Immunologic: Negative for immunocompromised state.  Neurological: Negative for headaches.  Hematological: Negative for adenopathy.    Allergies  Review of patient's allergies indicates no known allergies.  Home Medications   Current Outpatient Rx  Name  Route  Sig  Dispense  Refill  . lisinopril-hydrochlorothiazide (PRINZIDE,ZESTORETIC) 20-25 MG per tablet   Oral   Take 1 tablet by mouth daily.         Marland Kitchen acetaminophen (TYLENOL) 500 MG tablet   Oral   Take 1,000 mg by mouth every  6 (six) hours as needed. For pain.         . benzonatate (TESSALON) 100 MG capsule   Oral   Take 1 capsule (100 mg total) by mouth 3 (three) times daily as needed for cough.   14 capsule   0   . cetirizine-pseudoephedrine (ZYRTEC-D) 5-120 MG per tablet   Oral   Take 1 tablet by mouth 2 (two) times daily.   14 tablet   0   . Multiple Vitamin (MULTIVITAMIN WITH MINERALS) TABS   Oral   Take 1 tablet by mouth every other day.         .  neomycin-polymyxin-hydrocortisone (CORTISPORIN) otic solution   Left Ear   Place 3 drops into the left ear 4 (four) times daily.   10 mL   0    BP 114/42  Pulse 65  Temp(Src) 98.3 F (36.8 C) (Oral)  SpO2 100%  LMP 08/10/2012 Physical Exam  Nursing note and vitals reviewed. Constitutional: She is oriented to person, place, and time. Vital signs are normal.  Non-toxic appearance. She does not have a sickly appearance. She does not appear ill. No distress.  HENT:  Head: Normocephalic.  Right Ear: Hearing, tympanic membrane, external ear and ear canal normal.  Left Ear: External ear and ear canal normal.  Ears:  Mouth/Throat: Uvula is midline, oropharynx is clear and moist and mucous membranes are normal. Mucous membranes are not cyanotic. No oropharyngeal exudate, posterior oropharyngeal edema, posterior oropharyngeal erythema or tonsillar abscesses.  Eyes: Conjunctivae are normal. Right eye exhibits no discharge. Left eye exhibits no discharge.  Neck: Neck supple. No JVD present.  Pulmonary/Chest: Effort normal and breath sounds normal.  Lymphadenopathy:    She has no cervical adenopathy.  Neurological: She is alert and oriented to person, place, and time.  Skin: No rash noted. She is not diaphoretic. No erythema.    ED Course  Procedures (including critical care time) Labs Reviewed - No data to display No results found. 1. Cerumen impaction, left   2. Cough   3. Postnasal drip     MDM    Problem #1 reactive cough possibly secondary to postnasal dripping  Patient instructed to use Zyrtec for the next 2 weeks as well as use Tessalon Perles for cough   Problem #2 left-sided otalgia Cerumen impaction resolved with a localized area of erythema and irritation in her left ear canal had been provided with a prescription of optic antibiotic drops  Patient has also been advised to followup with her primary care Dr. for cough postnasal dripping and upper congestion persists  despite treatment.   New Prescriptions   BENZONATATE (TESSALON) 100 MG CAPSULE    Take 1 capsule (100 mg total) by mouth 3 (three) times daily as needed for cough.   CETIRIZINE-PSEUDOEPHEDRINE (ZYRTEC-D) 5-120 MG PER TABLET    Take 1 tablet by mouth 2 (two) times daily.   NEOMYCIN-POLYMYXIN-HYDROCORTISONE (CORTISPORIN) OTIC SOLUTION    Place 3 drops into the left ear 4 (four) times daily.   Jimmie Molly, MD 08/25/12 2017  Jimmie Molly, MD 08/25/12 2018

## 2012-09-12 ENCOUNTER — Emergency Department (HOSPITAL_COMMUNITY)
Admission: EM | Admit: 2012-09-12 | Discharge: 2012-09-12 | Disposition: A | Discharge status: Home / Self Care | Payer: Medicaid Other | Source: Home / Self Care | Attending: Emergency Medicine | Admitting: Emergency Medicine

## 2012-09-12 ENCOUNTER — Encounter (HOSPITAL_COMMUNITY): Payer: Self-pay | Admitting: Emergency Medicine

## 2012-09-12 ENCOUNTER — Emergency Department (INDEPENDENT_AMBULATORY_CARE_PROVIDER_SITE_OTHER): Payer: Medicaid Other

## 2012-09-12 DIAGNOSIS — H60399 Other infective otitis externa, unspecified ear: Secondary | ICD-10-CM

## 2012-09-12 DIAGNOSIS — H6690 Otitis media, unspecified, unspecified ear: Secondary | ICD-10-CM

## 2012-09-12 DIAGNOSIS — S2329XA Dislocation of other parts of thorax, initial encounter: Secondary | ICD-10-CM

## 2012-09-12 DIAGNOSIS — S2341XA Sprain of ribs, initial encounter: Secondary | ICD-10-CM

## 2012-09-12 DIAGNOSIS — H6092 Unspecified otitis externa, left ear: Secondary | ICD-10-CM

## 2012-09-12 DIAGNOSIS — H6692 Otitis media, unspecified, left ear: Secondary | ICD-10-CM

## 2012-09-12 MED ORDER — OXYCODONE-ACETAMINOPHEN 5-325 MG PO TABS: ORAL_TABLET | ORAL | Status: DC

## 2012-09-12 MED ORDER — AMOXICILLIN-POT CLAVULANATE 875-125 MG PO TABS: 1.0000 | ORAL_TABLET | Freq: Two times a day (BID) | ORAL | Status: DC

## 2012-09-12 MED ORDER — CIPROFLOXACIN-DEXAMETHASONE 0.3-0.1 % OT SUSP: 4.0000 [drp] | Freq: Two times a day (BID) | OTIC | Status: DC

## 2012-09-12 NOTE — ED Notes (Signed)
Located oxycodone script, acquired signature

## 2012-09-12 NOTE — ED Notes (Signed)
Patient has multiple complaints: patient reports left ear pain continues after being evaluated twice prior to today.  Patient also reports left side pain, under ribs.  This has been going on for a month.  Patient denies injury.  Reports this feels better lying flat -stretched out-, pain is constant and worse with sitting and standing.

## 2012-09-12 NOTE — ED Provider Notes (Signed)
Chief Complaint:   Chief Complaint  Patient presents with  . Otalgia  . Chest Pain    History of Present Illness:   Gabriela Lowe is a 47 year old female who has had ear pain and upper respiratory symptoms for a month. She was seen here first on July 3 and was treated with symptomatic medication. She returned on July 5 with the same symptoms plus left ear pain. Her ear was irrigated out. She was given some Cortisporin ear drops which she has been using, but she still feels no better. The patient notes pain in her left ear and diminished hearing. There's been no drainage from the ear. She did have some nasal congestion, rhinorrhea, sore throat but this has now gone away. Also today another major complaint is left, anterolateral rib cage pain for the past 2 weeks. This was not going on when she was here last. The pain is worse if she sits up or she coughs and better if she lies down. She has had a cough productive of clear sputum. She denies any wheezing. She's felt somewhat dizzy. She denies any GI symptoms.  Review of Systems:  Other than noted above, the patient denies any of the following symptoms: Systemic:  No fevers, chills, sweats, weight loss or gain, fatigue, or tiredness. Eye:  No redness or discharge. ENT:  No ear pain, drainage, headache, nasal congestion, drainage, sinus pressure, difficulty swallowing, or sore throat. Neck:  No neck pain or swollen glands. Lungs:  No cough, sputum production, hemoptysis, wheezing, chest tightness, shortness of breath or chest pain. GI:  No abdominal pain, nausea, vomiting or diarrhea.  PMFSH:  Past medical history, family history, social history, meds, and allergies were reviewed. She takes lisinopril/HCTZ 20/25 for high blood pressure.  Physical Exam:   Vital signs:  BP 148/87  Pulse 58  Temp(Src) 98.5 F (36.9 C) (Oral)  Resp 17  SpO2 99%  LMP 09/07/2012 General:  Alert and oriented.  In no distress.  Skin warm and dry. Eye:  No  conjunctival injection or drainage. Lids were normal. ENT:  The left TM is dull, opaque, and retracted and. The malleus handle was injected, the canal is slightly erythematous and swollen. There was a small amount of wax present which was removed with a plastic ear curette. The right TM and canal normal.  Nasal mucosa was clear and uncongested, without drainage.  Mucous membranes were moist.  Pharynx was clear with no exudate or drainage.  There were no oral ulcerations or lesions. Neck:  Supple, no adenopathy, tenderness or mass. Lungs:  No respiratory distress.  Lungs were clear to auscultation, without wheezes, rales or rhonchi.  Breath sounds were clear and equal bilaterally.  Heart:  Regular rhythm, without gallops, murmers or rubs. Skin:  Clear, warm, and dry, without rash or lesions.  Radiology:  Dg Chest 2 View  08/20/2012   *RADIOLOGY REPORT*  Clinical Data: Cough  CHEST - 2 VIEW  Comparison: None.  Findings: Lungs clear.  Heart is upper normal in size with normal pulmonary vascularity.  No adenopathy.  No pneumothorax.  No bone lesions.  IMPRESSION: No edema or consolidation.   Original Report Authenticated By: Bretta Bang, M.D.   Dg Ribs Unilateral W/chest Left  09/12/2012   *RADIOLOGY REPORT*  Clinical Data: 56-month history of cough.  Left antral lateral rib pain.  LEFT RIBS AND CHEST - 3+ VIEW  Comparison: No prior rib imaging.  Two-view chest x-ray 08/20/2012.  Findings: Site of maximal pain  and tenderness was marked with a metallic BB.  No left rib fracture identified.  No intrinsic osseous abnormality involving the left ribs.  Cardiomediastinal silhouette unremarkable, unchanged.  Lungs clear. Bronchovascular markings normal.  Pulmonary vascularity normal.  No pneumothorax.  No pleural effusions.  IMPRESSION:  1.  No left rib fractures identified. 2.  No acute cardiopulmonary disease.   Original Report Authenticated By: Hulan Saas, M.D.   Assessment:  The primary encounter  diagnosis was Otitis media, left. Diagnoses of Otitis externa, left and Costochondral separation, initial encounter were also pertinent to this visit.  The chest pain is probably due to a costochondral separation or a hairline rib fracture. UA the treatment for the 2 is the same. She appears to have both an otitis media and otitis externa. She's never received a round of antibiotics, so we'll go ahead and do this and also given eardrops. I suggested she followup with ENT in 10 days.  Plan:   1.  The following meds were prescribed:   Discharge Medication List as of 09/12/2012 11:29 AM    START taking these medications   Details  amoxicillin-clavulanate (AUGMENTIN) 875-125 MG per tablet Take 1 tablet by mouth 2 (two) times daily., Starting 09/12/2012, Until Discontinued, Normal    ciprofloxacin-dexamethasone (CIPRODEX) otic suspension Place 4 drops into the left ear 2 (two) times daily., Starting 09/12/2012, Until Discontinued, Normal    oxyCODONE-acetaminophen (PERCOCET) 5-325 MG per tablet 1 to 2 tablets every 6 hours as needed for pain., Print       2.  The patient was instructed in symptomatic care and handouts were given. 3.  The patient was told to return if becoming worse in any way, if no better in 3 or 4 days, and given some red flag symptoms such as worsening pain or fever that would indicate earlier return. 4.  Follow up with Dr. Suszanne Conners in 10 days.      Reuben Likes, MD 09/12/12 [phone]

## 2012-10-20 ENCOUNTER — Other Ambulatory Visit: Payer: Self-pay | Admitting: Physician Assistant

## 2012-10-20 DIAGNOSIS — N63 Unspecified lump in unspecified breast: Secondary | ICD-10-CM

## 2012-11-06 ENCOUNTER — Other Ambulatory Visit: Payer: Medicaid Other

## 2012-11-13 ENCOUNTER — Ambulatory Visit
Admission: RE | Admit: 2012-11-13 | Discharge: 2012-11-13 | Disposition: A | Discharge status: Home / Self Care | Payer: Medicaid Other | Source: Ambulatory Visit | Attending: Physician Assistant | Admitting: Physician Assistant

## 2012-11-13 DIAGNOSIS — N63 Unspecified lump in unspecified breast: Secondary | ICD-10-CM

## 2012-11-20 ENCOUNTER — Encounter (HOSPITAL_COMMUNITY): Payer: Self-pay | Admitting: Emergency Medicine

## 2012-11-20 ENCOUNTER — Emergency Department (HOSPITAL_COMMUNITY): Payer: Medicaid Other

## 2012-11-20 ENCOUNTER — Emergency Department (HOSPITAL_COMMUNITY)
Admission: EM | Admit: 2012-11-20 | Discharge: 2012-11-20 | Disposition: A | Discharge status: Home / Self Care | Payer: Medicaid Other | Attending: Emergency Medicine | Admitting: Emergency Medicine

## 2012-11-20 DIAGNOSIS — J069 Acute upper respiratory infection, unspecified: Secondary | ICD-10-CM

## 2012-11-20 DIAGNOSIS — Z79899 Other long term (current) drug therapy: Secondary | ICD-10-CM | POA: Insufficient documentation

## 2012-11-20 DIAGNOSIS — I10 Essential (primary) hypertension: Secondary | ICD-10-CM | POA: Insufficient documentation

## 2012-11-20 DIAGNOSIS — J029 Acute pharyngitis, unspecified: Secondary | ICD-10-CM | POA: Insufficient documentation

## 2012-11-20 DIAGNOSIS — J039 Acute tonsillitis, unspecified: Secondary | ICD-10-CM

## 2012-11-20 DIAGNOSIS — R1310 Dysphagia, unspecified: Secondary | ICD-10-CM | POA: Insufficient documentation

## 2012-11-20 DIAGNOSIS — R062 Wheezing: Secondary | ICD-10-CM | POA: Insufficient documentation

## 2012-11-20 LAB — RAPID STREP SCREEN (MED CTR MEBANE ONLY): Streptococcus, Group A Screen (Direct): NEGATIVE

## 2012-11-20 MED ORDER — ALBUTEROL SULFATE HFA 108 (90 BASE) MCG/ACT IN AERS
2.0000 | INHALATION_SPRAY | Freq: Once | RESPIRATORY_TRACT | Status: AC
  Administered 2012-11-20: 2 via RESPIRATORY_TRACT
  Filled 2012-11-20: qty 6.7

## 2012-11-20 MED ORDER — PREDNISONE 20 MG PO TABS: ORAL_TABLET | ORAL | Status: DC

## 2012-11-20 MED ORDER — ALBUTEROL SULFATE (5 MG/ML) 0.5% IN NEBU
5.0000 mg | INHALATION_SOLUTION | Freq: Once | RESPIRATORY_TRACT | Status: AC
  Administered 2012-11-20: 5 mg via RESPIRATORY_TRACT
  Filled 2012-11-20: qty 1

## 2012-11-20 MED ORDER — ALBUTEROL SULFATE (5 MG/ML) 0.5% IN NEBU: 5.0000 mg | INHALATION_SOLUTION | Freq: Once | RESPIRATORY_TRACT | Status: DC

## 2012-11-20 MED ORDER — DEXAMETHASONE SODIUM PHOSPHATE 10 MG/ML IJ SOLN
10.0000 mg | Freq: Once | INTRAMUSCULAR | Status: AC
  Administered 2012-11-20: 10 mg via INTRAMUSCULAR
  Filled 2012-11-20: qty 1

## 2012-11-20 NOTE — ED Provider Notes (Signed)
CSN: [id number]     Arrival date & time 11/20/12  0108 History   First MD Initiated Contact with Patient 11/20/12 0124     Chief Complaint  Patient presents with  . Cough   (Consider location/radiation/quality/duration/timing/severity/associated sxs/prior Treatment) HPI Comments: 47 year old female with a past medical history of hypertension presents to the emergency department with her partner complaining of a cough x3 days. Cough is productive with clear sputum, she has tried taking Mucinex without relief. A few hours ago she was laying down and began having a sensation that her throat was closing off. States it is difficult to swallow. Denies sore throat. Denies fever, chills, nausea, vomiting, chest pain, shortness of breath or wheezing. No sick contacts. States she was treated for bronchitis back in July and this feels similar.  Patient is a 47 y.o. female presenting with cough. The history is provided by the patient and a significant other.  Cough Associated symptoms: no chest pain, no chills, no fever, no shortness of breath, no sore throat and no wheezing     Past Medical History  Diagnosis Date  . Hypertension    Past Surgical History  Procedure Laterality Date  . Cesarean section    . Cholecystectomy     Family History  Problem Relation Age of Onset  . Hypertension Mother   . Diabetes Mother   . Diabetes Father   . Diabetes Brother    History  Substance Use Topics  . Smoking status: Never Smoker   . Smokeless tobacco: Not on file  . Alcohol Use: No   OB History   Grav Para Term Preterm Abortions TAB SAB Ect Mult Living                 Review of Systems  Constitutional: Negative for fever and chills.  HENT: Positive for trouble swallowing. Negative for sore throat and voice change.   Respiratory: Positive for cough. Negative for shortness of breath and wheezing.   Cardiovascular: Negative for chest pain.  Gastrointestinal: Negative for nausea and vomiting.   Neurological: Negative for dizziness and light-headedness.  All other systems reviewed and are negative.    Allergies  Review of patient's allergies indicates no known allergies.  Home Medications   Current Outpatient Rx  Name  Route  Sig  Dispense  Refill  . acetaminophen (TYLENOL) 500 MG tablet   Oral   Take 1,000 mg by mouth every 6 (six) hours as needed. For pain.         Marland Kitchen dextromethorphan-guaiFENesin (MUCINEX DM) 30-600 MG per 12 hr tablet   Oral   Take 1 tablet by mouth every 12 (twelve) hours.         Marland Kitchen lisinopril-hydrochlorothiazide (PRINZIDE,ZESTORETIC) 20-25 MG per tablet   Oral   Take 1 tablet by mouth daily.          BP 148/78  Pulse 77  Temp(Src) 98.7 F (37.1 C) (Oral)  Resp 22  Ht 5\' 5"  (1.651 m)  Wt 217 lb (98.431 kg)  BMI 36.11 kg/m2  SpO2 100%  LMP 11/15/2012 Physical Exam  Nursing note and vitals reviewed. Constitutional: She is oriented to person, place, and time. She appears well-developed and well-nourished. No distress.  HENT:  Head: Normocephalic and atraumatic.  Mouth/Throat: Uvula is midline and mucous membranes are normal. Posterior oropharyngeal edema and posterior oropharyngeal erythema present. No oropharyngeal exudate or tonsillar abscesses.  Tonsils enlarged and inflamed bilateral +3 without exudate. No tonsillar abscess. Swallowing secretions well. Post nasal drip  present.  Eyes: Conjunctivae are normal.  Neck: Normal range of motion. Neck supple. No tracheal deviation present.  Cardiovascular: Normal rate, regular rhythm and normal heart sounds.   Pulmonary/Chest: Effort normal. No respiratory distress. She has no decreased breath sounds. She has wheezes (minimal scattered expiratory). She has no rhonchi. She has no rales.  Musculoskeletal: Normal range of motion. She exhibits no edema.  Neurological: She is alert and oriented to person, place, and time.  Skin: Skin is warm and dry. She is not diaphoretic.  Psychiatric: She  has a normal mood and affect. Her behavior is normal.    ED Course  Procedures (including critical care time) Labs Review Labs Reviewed  RAPID STREP SCREEN   Imaging Review Dg Chest 2 View  11/20/2012   CLINICAL DATA:  Cough, hypertension.  EXAM: CHEST  2 VIEW  COMPARISON:  None  FINDINGS: The heart size and mediastinal contours are within normal limits. Both lungs are clear. The visualized skeletal structures are unremarkable.  IMPRESSION: No active cardiopulmonary disease.   Electronically Signed   By: Charlett Nose M.D.   On: 11/20/2012 02:08    MDM   1. Tonsillitis   2. URI (upper respiratory infection)     Patient with cough and sensation of her throat closing off. Tonsils enlarged and inflamed bilateral without exudate. She is swallowing secretions well. Rapid strep pending. 10 mg IM Decadron given. Will obtain chest x-ray and give breathing treatment. 2:49 AM Patient reports her symptoms are beginning to improve with above treatment. Rapid strep negative. Lungs with clinical improvement after nebulizer treatment. She is well appearing and in no apparent distress. Will discharge with a short course of prednisone, albuterol inhaler. Close return precautions discussed. Patient states understanding of plan and is agreeable.  Trevor Mace, PA-C 11/20/12 [phone]

## 2012-11-20 NOTE — ED Notes (Signed)
Pt states she has had a cough for the past 3 to 4 days and she has been using mucinex for the cough  Pt states tonight when she laid down she feels as if her throat is closing off on her  Pt states it is difficult for her to swallow

## 2012-11-20 NOTE — ED Provider Notes (Signed)
Medical screening examination/treatment/procedure(s) were performed by non-physician practitioner and as supervising physician I was immediately available for consultation/collaboration.   Gwyneth Sprout, MD 11/20/12 [phone]

## 2012-11-22 LAB — CULTURE, GROUP A STREP

## 2013-01-10 ENCOUNTER — Emergency Department (HOSPITAL_COMMUNITY)
Admission: EM | Admit: 2013-01-10 | Discharge: 2013-01-10 | Disposition: A | Discharge status: Home / Self Care | Payer: Medicaid Other | Source: Home / Self Care | Attending: Emergency Medicine | Admitting: Emergency Medicine

## 2013-01-10 ENCOUNTER — Encounter (HOSPITAL_COMMUNITY): Payer: Self-pay | Admitting: Emergency Medicine

## 2013-01-10 DIAGNOSIS — J069 Acute upper respiratory infection, unspecified: Secondary | ICD-10-CM

## 2013-01-10 DIAGNOSIS — J019 Acute sinusitis, unspecified: Secondary | ICD-10-CM

## 2013-01-10 DIAGNOSIS — J209 Acute bronchitis, unspecified: Secondary | ICD-10-CM

## 2013-01-10 DIAGNOSIS — J039 Acute tonsillitis, unspecified: Secondary | ICD-10-CM

## 2013-01-10 HISTORY — DX: Bronchitis, not specified as acute or chronic: J40

## 2013-01-10 LAB — POCT RAPID STREP A: Streptococcus, Group A Screen (Direct): NEGATIVE

## 2013-01-10 MED ORDER — FLUTICASONE PROPIONATE 50 MCG/ACT NA SUSP: 2.0000 | Freq: Every day | NASAL | Status: DC

## 2013-01-10 MED ORDER — TRAMADOL HCL 50 MG PO TABS: 100.0000 mg | ORAL_TABLET | Freq: Three times a day (TID) | ORAL | Status: DC | PRN

## 2013-01-10 MED ORDER — AMOXICILLIN-POT CLAVULANATE 875-125 MG PO TABS: 1.0000 | ORAL_TABLET | Freq: Two times a day (BID) | ORAL | Status: DC

## 2013-01-10 NOTE — ED Notes (Signed)
Started with nasal congestion approx 1 wk ago, then couple days later bilat ears started to feel plugged.  Now has productive cough - family member states pt has never completely gotten over cough since bronchitis dx in July.  Denies fevers.  Has been taking OTC cough meds.

## 2013-01-10 NOTE — ED Provider Notes (Signed)
Chief Complaint:   Chief Complaint  Patient presents with  . Cough  . Sore Throat  . Nasal Congestion    History of Present Illness:   Gabriela Lowe is a 47 year old female who's had a one-week history of cough productive yellow sputum, aching or ears, nasal congestion, sore throat, facial pain, nasal congestion with green drainage, and sweats. She denies any fever, chills, myalgias, wheezing, chest tightness, chest pain, or GI symptoms. She has no known sick exposures.  Review of Systems:  Other than noted above, the patient denies any of the following symptoms: Systemic:  No fevers, chills, sweats, weight loss or gain, fatigue, or tiredness. Eye:  No redness or discharge. ENT:  No ear pain, drainage, headache, nasal congestion, drainage, sinus pressure, difficulty swallowing, or sore throat. Neck:  No neck pain or swollen glands. Lungs:  No cough, sputum production, hemoptysis, wheezing, chest tightness, shortness of breath or chest pain. GI:  No abdominal pain, nausea, vomiting or diarrhea.  PMFSH:  Past medical history, family history, social history, meds, and allergies were reviewed. She takes lisinopril for high blood pressure.  Physical Exam:   Vital signs:  BP 179/94  Pulse 61  Temp(Src) 98.8 F (37.1 C) (Oral)  Resp 18  SpO2 100%  LMP 01/04/2013 General:  Alert and oriented.  In no distress.  Skin warm and dry. Eye:  No conjunctival injection or drainage. Lids were normal. ENT:  TMs and canals were normal, without erythema or inflammation.  Nasal mucosa was clear and uncongested, without drainage.  Mucous membranes were moist.  Tonsils were enlarged and red without any exudate or drainage.  There were no oral ulcerations or lesions. Neck:  Supple, no adenopathy, tenderness or mass. Lungs:  No respiratory distress.  Lungs were clear to auscultation, without wheezes, rales or rhonchi.  Breath sounds were clear and equal bilaterally.  Heart:  Regular rhythm, without  gallops, murmers or rubs. Skin:  Clear, warm, and dry, without rash or lesions.  Labs:   Results for orders placed during the hospital encounter of 01/10/13  POCT RAPID STREP A ([facility] ONLY)      Result Value Range   Streptococcus, Group A Screen (Direct) NEGATIVE  NEGATIVE    Assessment:  The primary encounter diagnosis was Viral upper respiratory infection. Diagnoses of Acute sinusitis, Acute bronchitis, and Tonsillitis were also pertinent to this visit.  Plan:   1.  Meds:  The following meds were prescribed:   Discharge Medication List as of 01/10/2013 12:54 PM    START taking these medications   Details  amoxicillin-clavulanate (AUGMENTIN) 875-125 MG per tablet Take 1 tablet by mouth 2 (two) times daily., Starting 01/10/2013, Until Discontinued, Normal    fluticasone (FLONASE) 50 MCG/ACT nasal spray Place 2 sprays into both nostrils daily., Starting 01/10/2013, Until Discontinued, Normal    traMADol (ULTRAM) 50 MG tablet Take 2 tablets (100 mg total) by mouth every 8 (eight) hours as needed., Starting 01/10/2013, Until Discontinued, Normal        2.  Patient Education/Counseling:  The patient was given appropriate handouts, self care instructions, and instructed in symptomatic relief.   3.  Follow up:  The patient was told to follow up if no better in 3 to 4 days, if becoming worse in any way, and given some red flag symptoms such as fever or difficulty breathing which would prompt immediate return.  Follow up here as necessary.      Reuben Likes, MD 01/10/13 [phone]

## 2013-01-12 LAB — CULTURE, GROUP A STREP

## 2013-02-08 ENCOUNTER — Encounter (HOSPITAL_COMMUNITY): Payer: Self-pay | Admitting: Emergency Medicine

## 2013-02-08 ENCOUNTER — Emergency Department (HOSPITAL_COMMUNITY)
Admission: EM | Admit: 2013-02-08 | Discharge: 2013-02-08 | Disposition: A | Discharge status: Home / Self Care | Payer: Medicaid Other | Attending: Emergency Medicine | Admitting: Emergency Medicine

## 2013-02-08 ENCOUNTER — Emergency Department (HOSPITAL_COMMUNITY): Payer: Medicaid Other

## 2013-02-08 DIAGNOSIS — R1013 Epigastric pain: Secondary | ICD-10-CM | POA: Insufficient documentation

## 2013-02-08 DIAGNOSIS — Z79899 Other long term (current) drug therapy: Secondary | ICD-10-CM | POA: Insufficient documentation

## 2013-02-08 DIAGNOSIS — R109 Unspecified abdominal pain: Secondary | ICD-10-CM

## 2013-02-08 DIAGNOSIS — R110 Nausea: Secondary | ICD-10-CM | POA: Insufficient documentation

## 2013-02-08 DIAGNOSIS — Z8709 Personal history of other diseases of the respiratory system: Secondary | ICD-10-CM | POA: Insufficient documentation

## 2013-02-08 DIAGNOSIS — IMO0002 Reserved for concepts with insufficient information to code with codable children: Secondary | ICD-10-CM | POA: Insufficient documentation

## 2013-02-08 DIAGNOSIS — I10 Essential (primary) hypertension: Secondary | ICD-10-CM | POA: Insufficient documentation

## 2013-02-08 LAB — CBC WITH DIFFERENTIAL/PLATELET
Basophils Absolute: 0 10*3/uL (ref 0.0–0.1)
Basophils Relative: 0 % (ref 0–1)
Eosinophils Absolute: 0.1 10*3/uL (ref 0.0–0.7)
Eosinophils Relative: 1 % (ref 0–5)
HCT: 39.3 % (ref 36.0–46.0)
Hemoglobin: 11.9 g/dL — ABNORMAL LOW (ref 12.0–15.0)
Lymphocytes Relative: 23 % (ref 12–46)
Lymphs Abs: 1.8 10*3/uL (ref 0.7–4.0)
MCH: 21.7 pg — ABNORMAL LOW (ref 26.0–34.0)
MCHC: 30.3 g/dL (ref 30.0–36.0)
MCV: 71.7 fL — ABNORMAL LOW (ref 78.0–100.0)
Monocytes Absolute: 0.4 10*3/uL (ref 0.1–1.0)
Monocytes Relative: 5 % (ref 3–12)
Neutro Abs: 5.5 10*3/uL (ref 1.7–7.7)
Neutrophils Relative %: 70 % (ref 43–77)
Platelets: 292 10*3/uL (ref 150–400)
RBC: 5.48 MIL/uL — ABNORMAL HIGH (ref 3.87–5.11)
RDW: 16.9 % — ABNORMAL HIGH (ref 11.5–15.5)
WBC: 7.9 10*3/uL (ref 4.0–10.5)

## 2013-02-08 LAB — COMPREHENSIVE METABOLIC PANEL
ALT: 15 U/L (ref 0–35)
AST: 25 U/L (ref 0–37)
Albumin: 4 g/dL (ref 3.5–5.2)
Alkaline Phosphatase: 82 U/L (ref 39–117)
BUN: 14 mg/dL (ref 6–23)
CO2: 27 mEq/L (ref 19–32)
Calcium: 9.6 mg/dL (ref 8.4–10.5)
Chloride: 101 mEq/L (ref 96–112)
Creatinine, Ser: 1.17 mg/dL — ABNORMAL HIGH (ref 0.50–1.10)
GFR calc Af Amer: 63 mL/min — ABNORMAL LOW (ref >90)
GFR calc non Af Amer: 55 mL/min — ABNORMAL LOW (ref >90)
Glucose, Bld: 108 mg/dL — ABNORMAL HIGH (ref 70–99)
Potassium: 3.8 mEq/L (ref 3.5–5.1)
Sodium: 139 mEq/L (ref 135–145)
Total Bilirubin: 0.4 mg/dL (ref 0.3–1.2)
Total Protein: 8.3 g/dL (ref 6.0–8.3)

## 2013-02-08 LAB — LIPASE, BLOOD: Lipase: 18 U/L (ref 11–59)

## 2013-02-08 MED ORDER — OXYCODONE-ACETAMINOPHEN 5-325 MG PO TABS: 1.0000 | ORAL_TABLET | ORAL | Status: DC | PRN

## 2013-02-08 MED ORDER — ONDANSETRON HCL 4 MG/2ML IJ SOLN
4.0000 mg | Freq: Once | INTRAMUSCULAR | Status: AC
  Administered 2013-02-08: 4 mg via INTRAVENOUS
  Filled 2013-02-08: qty 2

## 2013-02-08 MED ORDER — SODIUM CHLORIDE 0.9 % IV SOLN: 1000.0000 mL | INTRAVENOUS | Status: DC

## 2013-02-08 MED ORDER — METOCLOPRAMIDE HCL 5 MG/ML IJ SOLN
10.0000 mg | Freq: Once | INTRAMUSCULAR | Status: AC
  Administered 2013-02-08: 10 mg via INTRAVENOUS
  Filled 2013-02-08: qty 2

## 2013-02-08 MED ORDER — MORPHINE SULFATE 4 MG/ML IJ SOLN
6.0000 mg | Freq: Once | INTRAMUSCULAR | Status: AC
  Administered 2013-02-08: 6 mg via INTRAVENOUS
  Filled 2013-02-08: qty 2

## 2013-02-08 MED ORDER — IOHEXOL 300 MG/ML  SOLN
100.0000 mL | Freq: Once | INTRAMUSCULAR | Status: AC | PRN
  Administered 2013-02-08: 100 mL via INTRAVENOUS

## 2013-02-08 MED ORDER — IOHEXOL 300 MG/ML  SOLN
50.0000 mL | Freq: Once | INTRAMUSCULAR | Status: AC | PRN
  Administered 2013-02-08: 50 mL via ORAL

## 2013-02-08 MED ORDER — SODIUM CHLORIDE 0.9 % IV SOLN
1000.0000 mL | Freq: Once | INTRAVENOUS | Status: DC
  Administered 2013-02-08: 1000 mL via INTRAVENOUS

## 2013-02-08 MED ORDER — PROMETHAZINE HCL 25 MG PO TABS: 25.0000 mg | ORAL_TABLET | Freq: Four times a day (QID) | ORAL | Status: DC | PRN

## 2013-02-08 NOTE — ED Notes (Signed)
MD at bedside. EDP Campos updating pt and family

## 2013-02-08 NOTE — ED Provider Notes (Signed)
CSN: [id number]     Arrival date & time 02/08/13  1916 History   First MD Initiated Contact with Patient 02/08/13 1934     Chief Complaint  Patient presents with  . Abdominal Pain   HPI Patient reports epigastric pain intermittently over the past 2 weeks it seems to be worsening over the past 24 hours.  She's had some nausea without vomiting.  She denies diarrhea.  No fevers or chills.  No chest pain shortness of breath.  No cough or congestion.  She's had one episode of pain like this before several years ago that seemed to resolve after several days.  She's never followed up with her gastroenterologist.  She denies diarrhea or constipation.  No melena or hematochezia.  No recent sick contacts.  Her discomfort is mild in severity at this time.  Nothing worsens or improves her pain   Past Medical History  Diagnosis Date  . Hypertension   . Bronchitis    Past Surgical History  Procedure Laterality Date  . Cesarean section    . Cholecystectomy     Family History  Problem Relation Age of Onset  . Hypertension Mother   . Diabetes Mother   . Diabetes Father   . Diabetes Brother    History  Substance Use Topics  . Smoking status: Never Smoker   . Smokeless tobacco: Not on file  . Alcohol Use: No   OB History   Grav Para Term Preterm Abortions TAB SAB Ect Mult Living                 Review of Systems  All other systems reviewed and are negative.    Allergies  Review of patient's allergies indicates no known allergies.  Home Medications   Current Outpatient Rx  Name  Route  Sig  Dispense  Refill  . acetaminophen (TYLENOL) 500 MG tablet   Oral   Take 1,000 mg by mouth every 6 (six) hours as needed. For pain.         . fluticasone (FLONASE) 50 MCG/ACT nasal spray   Each Nare   Place 2 sprays into both nostrils daily.   16 g   0   . lisinopril-hydrochlorothiazide (PRINZIDE,ZESTORETIC) 20-25 MG per tablet   Oral   Take 1 tablet by mouth daily.         .  Multiple Vitamin (MULTIVITAMIN WITH MINERALS) TABS tablet   Oral   Take 1 tablet by mouth daily.         Marland Kitchen oxyCODONE-acetaminophen (PERCOCET/ROXICET) 5-325 MG per tablet   Oral   Take 1 tablet by mouth every 4 (four) hours as needed for severe pain.   20 tablet   0   . promethazine (PHENERGAN) 25 MG tablet   Oral   Take 1 tablet (25 mg total) by mouth every 6 (six) hours as needed for nausea or vomiting.   15 tablet   0    BP 134/74  Pulse 85  Temp(Src) 98.2 F (36.8 C) (Oral)  Resp 18  SpO2 99%  LMP 02/03/2013 Physical Exam  Nursing note and vitals reviewed. Constitutional: She is oriented to person, place, and time. She appears well-developed and well-nourished. No distress.  HENT:  Head: Normocephalic and atraumatic.  Eyes: EOM are normal.  Neck: Normal range of motion.  Cardiovascular: Normal rate, regular rhythm and normal heart sounds.   Pulmonary/Chest: Effort normal and breath sounds normal.  Abdominal: Soft. She exhibits no distension.  Mild epigastric discomfort without  guarding or rebound  Musculoskeletal: Normal range of motion.  Neurological: She is alert and oriented to person, place, and time.  Skin: Skin is warm and dry.  Psychiatric: She has a normal mood and affect. Judgment normal.    ED Course  Procedures (including critical care time) Labs Review Labs Reviewed  CBC WITH DIFFERENTIAL - Abnormal; Notable for the following:    RBC 5.48 (*)    Hemoglobin 11.9 (*)    MCV 71.7 (*)    MCH 21.7 (*)    RDW 16.9 (*)    All other components within normal limits  COMPREHENSIVE METABOLIC PANEL - Abnormal; Notable for the following:    Glucose, Bld 108 (*)    Creatinine, Ser 1.17 (*)    GFR calc non Af Amer 55 (*)    GFR calc Af Amer 63 (*)    All other components within normal limits  LIPASE, BLOOD   Imaging Review Ct Abdomen Pelvis W Contrast  02/08/2013   CLINICAL DATA:  Abdominal pain.  Epigastric pain for 2 weeks.  EXAM: CT ABDOMEN AND  PELVIS WITH CONTRAST  TECHNIQUE: Multidetector CT imaging of the abdomen and pelvis was performed using the standard protocol following bolus administration of intravenous contrast.  CONTRAST:  50mL OMNIPAQUE IOHEXOL 300 MG/ML SOLN, OMNIPAQUE IOHEXOL 300 MG/ML SOLN  COMPARISON:  01/07/2010.  FINDINGS: Lung Bases: Clear.  Liver: Minimal postcholecystectomy intrahepatic biliary ductal dilation. Small amount of perihepatic ascites.  Spleen:  Normal.  Gallbladder:  Surgically absent.  Common bile duct:  Normal.  Pancreas: Mild prominence of pancreatic duct. No obstructing mass lesion identified. This may relate to sphincter dysfunction.  Adrenal glands:  Normal.  Kidneys: Normal enhancement. Normal delayed excretion of contrast bilaterally. The ureters appear within normal limits.  Stomach:  Normal.  Small bowel: Duodenum appears normal. Laboratory changes of small bowel are present in the central abdomen and extending into the anatomic pelvis. There is mural thickening with mesenteric fat stranding and ascites. The visceral vasculature appears adequately patent.  Colon: Rectum appears normal. The colon is predominantly decompressed. Fluid levels are present within the proximal colon. Normal appendix.  Pelvic Genitourinary: Uterus and adnexa appear within normal limits.  Bones: No aggressive osseous lesions. Calcific tendinitis of the common hamstring origins. Bilateral SI joint degenerative disease. Lumbar spondylosis is mild.  Vasculature: Normal.  Body Wall: Tiny fat containing periumbilical hernia.  IMPRESSION: 1. Segmental small bowel all inflammatory changes. This is favored to represent inflammatory bowel disease. Ischemia or infection are considered less likely. 2. Small amount of reactive ascites. 3. Cholecystectomy.   Electronically Signed   By: Andreas Newport M.D.   On: 02/08/2013 23:14  I personally reviewed the imaging tests through PACS system I reviewed available ER/hospitalization records  through the EMR   EKG Interpretation   None       MDM   1. Abdominal pain    Concerning for possible inflammatory bowel disease.  Patient feels much better at time of discharge.  Outpatient GI followup.  Home with symptomatic control.  She understands to return to the ER for new or worsening symptoms    Lyanne Co, MD 02/08/13 2348

## 2013-02-08 NOTE — ED Notes (Signed)
Pt complains of epigastric pain for about 2 weeks intermittently. No vomiting or diarrhea

## 2013-02-16 ENCOUNTER — Encounter: Payer: Self-pay | Admitting: Internal Medicine

## 2013-02-23 ENCOUNTER — Other Ambulatory Visit: Payer: Self-pay | Admitting: Physician Assistant

## 2013-02-23 DIAGNOSIS — N63 Unspecified lump in unspecified breast: Secondary | ICD-10-CM

## 2013-03-02 ENCOUNTER — Other Ambulatory Visit: Payer: Medicaid Other

## 2013-03-11 ENCOUNTER — Encounter: Payer: Self-pay | Admitting: Internal Medicine

## 2013-03-15 ENCOUNTER — Ambulatory Visit (INDEPENDENT_AMBULATORY_CARE_PROVIDER_SITE_OTHER): Payer: Medicaid Other | Admitting: Internal Medicine

## 2013-03-15 ENCOUNTER — Other Ambulatory Visit (INDEPENDENT_AMBULATORY_CARE_PROVIDER_SITE_OTHER): Payer: Medicaid Other

## 2013-03-15 ENCOUNTER — Encounter: Payer: Self-pay | Admitting: Internal Medicine

## 2013-03-15 VITALS — BP 130/78 | HR 64 | Ht 65.0 in | Wt 214.0 lb

## 2013-03-15 DIAGNOSIS — R112 Nausea with vomiting, unspecified: Secondary | ICD-10-CM

## 2013-03-15 DIAGNOSIS — R933 Abnormal findings on diagnostic imaging of other parts of digestive tract: Secondary | ICD-10-CM

## 2013-03-15 DIAGNOSIS — R110 Nausea: Secondary | ICD-10-CM

## 2013-03-15 DIAGNOSIS — R109 Unspecified abdominal pain: Secondary | ICD-10-CM

## 2013-03-15 DIAGNOSIS — D509 Iron deficiency anemia, unspecified: Secondary | ICD-10-CM | POA: Insufficient documentation

## 2013-03-15 DIAGNOSIS — I10 Essential (primary) hypertension: Secondary | ICD-10-CM | POA: Insufficient documentation

## 2013-03-15 LAB — IBC PANEL
Iron: 17 ug/dL — ABNORMAL LOW (ref 42–145)
Saturation Ratios: 3.6 % — ABNORMAL LOW (ref 20.0–50.0)
Transferrin: 340 mg/dL (ref 212.0–360.0)

## 2013-03-15 LAB — CBC
HCT: 30.6 % — ABNORMAL LOW (ref 36.0–46.0)
Hemoglobin: 9.3 g/dL — ABNORMAL LOW (ref 12.0–15.0)
MCHC: 30.3 g/dL (ref 30.0–36.0)
MCV: 71 fl — ABNORMAL LOW (ref 78.0–100.0)
Platelets: 281 10*3/uL (ref 150.0–400.0)
RBC: 4.32 Mil/uL (ref 3.87–5.11)
RDW: 18.7 % — ABNORMAL HIGH (ref 11.5–14.6)
WBC: 5.4 10*3/uL (ref 4.5–10.5)

## 2013-03-15 LAB — FERRITIN: Ferritin: 4 ng/mL — ABNORMAL LOW (ref 10.0–291.0)

## 2013-03-15 LAB — HIGH SENSITIVITY CRP: CRP, High Sensitivity: 5.48 mg/L — ABNORMAL HIGH (ref 0.000–5.000)

## 2013-03-15 LAB — SEDIMENTATION RATE: Sed Rate: 26 mm/hr — ABNORMAL HIGH (ref 0–22)

## 2013-03-15 MED ORDER — HYOSCYAMINE SULFATE 0.125 MG SL SUBL: 0.1250 mg | SUBLINGUAL_TABLET | SUBLINGUAL | Status: DC | PRN

## 2013-03-15 MED ORDER — MOVIPREP 100 G PO SOLR: ORAL | Status: DC

## 2013-03-15 NOTE — Patient Instructions (Signed)
You have been scheduled for a colonoscopy/Endoscopy with propofol. Please follow written instructions given to you at your visit today.  Please pick up your prep kit at the pharmacy within the next 1-3 days. If you use inhalers (even only as needed), please bring them with you on the day of your procedure. Your physician has requested that you go to www.startemmi.com and enter the access code given to you at your visit today. This web site gives a general overview about your procedure. However, you should still follow specific instructions given to you by our office regarding your preparation for the procedure.  We have sent the following prescriptions to your mail in pharmacy: moviprep, Levsin  You may use Zantac, phenergen as needed   Your physician has requested that you go to the basement for  lab work before leaving today  Low-Fiber Diet Fiber is found in fruits, vegetables, and grains. A low-fiber diet restricts fibrous foods that are not digested in the small intestine. A diet containing about 10 grams of fiber is considered low fiber.  PURPOSE  To prevent blockage of a partially obstructed or narrowed gastrointestinal tract.  To reduce fecal weight and volume.  To slow the movement of feces. WHEN IS THIS DIET USED?  It may be used during the acute phase of Crohn disease, ulcerative colitis, regional enteritis, or diverticulitis.  It may be used if your intestinal or esophageal tubes are narrowing (stenosis).  It may be used as a transitional diet following surgery, injury (trauma), or illness. CHOOSING FOODS Check labels, especially on foods from the starch list. Often times, dietary fiber content is listed on the nutrition facts panel. Please ask your Registered Dietitian if you have questions about specific foods that are related to your condition, especially if the food is not listed on this handout. Breads and Starches  Allowed: White, [place], and pita breads, plain rolls,  buns, or sweet rolls, doughnuts, waffles, pancakes, bagels. Plain muffins, biscuits, matzoth. Soda, saltine, graham crackers. Pretzels, rusks, melba toast, zwieback. Cooked cereals: cornmeal, farina, or cream cereals. Dry cereals: refined corn, wheat, rice, and oat cereals (check label). Potatoes prepared any way without skins, refined macaroni, spaghetti, noodles, refined rice.  Avoid: Whole-wheat bread, rolls, and crackers. Multigrains, rye, bran seeds, nuts, or coconut. Cereals containing whole grains, multigrains, bran, coconut, nuts, raisins. Cooked or dry oatmeal. Coarse wheat cereals, granola. Cereals advertised as "high fiber." Potato skins. Whole-grain pasta, wild or brown rice. Popcorn. Vegetables  Allowed: Strained tomato and vegetable juices. Fresh lettuce, cucumber, spinach. Well-cooked or canned: asparagus, bean sprouts, broccoli, cut green beans, cauliflower, pumpkin, beets, mushrooms, yellow squash, tomato, tomato sauce, zucchini, turnips.Keep servings limited to  cup.  Avoid: Fresh, cooked, or canned: artichokes, baked beans, beet greens, Brussels sprouts, corn, kale, legumes, peas, sweet potatoes. Avoid large servings of any vegetables. Fruit  Allowed: All fruit juices except prune juice. Cooked or canned fruits without skin and seeds: apricots, applesauce, cantaloupe, cherries, grapefruit, grapes, kiwi, mandarin oranges, peaches, pears, fruit cocktail, pineapple, plums, watermelon. Fresh without skin: banana, grapes, cantaloupe, avocado, cherries, pineapple, kiwi, nectarines, peaches, blueberries. Keep servings limited to  cup or 1 piece.  Avoid: Fresh: apples with or without skin, apricots, mangoes, pears, raspberries, strawberries. Prune juice and juices with pulp, stewed or dried prunes. Dried fruits, raisins, dates. Avoid large servings of all fresh fruits. Meat and Protein Substitutes  Allowed: Ground or well-cooked tender beef, ham, veal, lamb, pork, poultry. Eggs, plain  cheese. Fish, oysters, shrimp, lobster, other seafood.  Liver, organ meats. Smooth nut butters.  Avoid: Tough, fibrous meats with gristle. Chunky nut butter.Cheese with seeds, nuts, or other foods not allowed. Nuts, seeds, legumes, dried peas, beans, lentils. Dairy  Allowed: All milk products except those not allowed.  Avoid: Yogurt or cheese that contains nuts, seeds, or added fruit. Soups and Combination Foods  Allowed: Bouillon, broth, or cream soups made from allowed foods. Any strained soup. Casseroles or mixed dishes made with allowed foods.  Avoid: Soups made from vegetables that are not allowed or that contain other foods not allowed. Desserts and Sweets  Allowed:Plain cakes and cookies, pie made with allowed fruit, pudding, custard, cream pie. Gelatin, fruit, ice, sherbet, frozen ice pops. Ice cream, ice milk without nuts. Plain hard candy, honey, jelly, molasses, syrup, sugar, chocolate syrup, gumdrops, marshmallows.  Avoid: Desserts, cookies, or candies that contain nuts, peanut butter, dried fruits. Jams, preserves with seeds, marmalade. Fats and Oils  Allowed:Margarine, butter, cream, mayonnaise, salad oils, plain salad dressings made from allowed foods.  Avoid: Seeds, nuts, olives. Beverages  Allowed: All, except those listed to avoid.  Avoid: Fruit juices with high pulp, prune juice. Condiments  Allowed:Ketchup, mustard, horseradish, vinegar, cream sauce, cheese sauce, cocoa powder. Spices in moderation: allspice, basil, bay leaves, celery powder or leaves, cinnamon, cumin powder, curry powder, ginger, mace, marjoram, onion or garlic powder, oregano, paprika, parsley flakes, ground pepper, rosemary, sage, savory, tarragon, thyme, turmeric.  Avoid: Coconut, pickles. SAMPLE MENU Breakfast   cup orange juice.  1 boiled egg.  1 slice white toast.  Margarine.   cup cornflakes.  1 cup milk.  Beverage. Lunch   cup chicken noodle soup.  2 to 3 oz  sliced roast beef.  2 slices white bread.  Mayonnaise.   cup tomato juice.  1 small banana.  Beverage. Dinner  3 oz baked chicken.   cup scalloped potatoes.   cup cooked beets.  White dinner roll.  Margarine.   cup canned peaches.  Beverage. Document Released: 07/27/2001 Document Revised: 10/07/2012 Document Reviewed: 02/21/2011 [facility] Patient Information 2014 [place].

## 2013-03-15 NOTE — Progress Notes (Signed)
Patient ID: Gabriela Lowe, female   DOB: Aug 03, 1965, 48 y.o.   MRN: [id number] HPI: Gabriela Lowe is a 48 yo female with PMH of hypertension and also an abdominal pain for the last several years who is seen in consultation at the request of Dr. Kennon Rounds to evaluate abdominal pain. She is here today with a friend. She reports on and off mid and left lower quadrant abdominal pain over the last 3-4 years. She remembers this started around 2011. It does seen to be episodic but when it is present can be so for days to weeks. She thinks it is definitely worsened by eating. Foods such as peanuts, corns and raisins makes the pain worse. The pain can be sharp and cramping in nature. On her most severe day she develops diarrhea which can be 4-6 times daily. She reports there is mucus in her stools but no blood or melena. She has nausea which is not necessarily related to her pain and occasional vomiting. She does report heartburn which is treated with over-the-counter ranitidine. Ranitidine works very well for her. No dysphagia or odynophagia. She has lost about 10-15 pounds in the last 6-12 months. 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 Last menstrual period 03/08/2013 which was normal. No fevers or chills. No rashes, eye complaints.  She does report joint pains in her shoulders and hands, which she thought was "aging".  No previous history of EGD or colonoscopy.  No family history of IBD or colon cancer  Past Medical History  Diagnosis Date  . Hypertension   . Bronchitis     Past Surgical History  Procedure Laterality Date  . Cesarean section      x 2  . Cholecystectomy      Current Outpatient Prescriptions  Medication Sig Dispense Refill  . acetaminophen (TYLENOL) 500 MG tablet Take 1,000 mg by mouth every 6 (six) hours as needed. For pain.      . fluticasone (FLONASE) 50 MCG/ACT nasal spray Place 2 sprays into both nostrils daily.  16 g  0  .  lisinopril-hydrochlorothiazide (PRINZIDE,ZESTORETIC) 20-25 MG per tablet Take 1 tablet by mouth daily.      . Multiple Vitamin (MULTIVITAMIN WITH MINERALS) TABS tablet Take 1 tablet by mouth daily.      Marland Kitchen oxyCODONE-acetaminophen (PERCOCET/ROXICET) 5-325 MG per tablet Take 1 tablet by mouth every 4 (four) hours as needed for severe pain.  20 tablet  0  . promethazine (PHENERGAN) 25 MG tablet Take 1 tablet (25 mg total) by mouth every 6 (six) hours as needed for nausea or vomiting.  15 tablet  0  . hyoscyamine (LEVSIN SL) 0.125 MG SL tablet Place 1 tablet (0.125 mg total) under the tongue every 4 (four) hours as needed.  30 tablet  0  . MOVIPREP 100 G SOLR Use per prep instruction  1 kit  0   No current facility-administered medications for this visit.    No Known Allergies  Family History  Problem Relation Age of Onset  . Hypertension Mother   . Diabetes Mother   . Diabetes Father   . Diabetes Brother     History  Substance Use Topics  . Smoking status: Never Smoker   . Smokeless tobacco: Never Used  . Alcohol Use: No    ROS: As per history of present illness, otherwise negative  BP 130/78  Pulse 64  Ht _0  (1.651 m)  Wt 214 lb (97.07 kg)  BMI 35.61 kg/m2  LMP 03/08/2013 Constitutional: Well-developed and well-nourished. No distress. HEENT: Normocephalic and atraumatic. Oropharynx is clear and moist. No oropharyngeal exudate. Conjunctivae are normal.  No scleral icterus. Neck: Neck supple. Trachea midline. Cardiovascular: Normal rate, regular rhythm and intact distal pulses. No M/R/G Pulmonary/chest: Effort normal and breath sounds normal. No wheezing, rales or rhonchi. Abdominal: Soft, diffuse abdominal tenderness with deep palpation without rebound or guarding, nondistended. Bowel sounds active throughout. There are no masses palpable. No hepatosplenomegaly. Extremities: no clubbing, cyanosis, or edema Lymphadenopathy: No cervical adenopathy noted. Neurological: Alert  and oriented to person place and time. Skin: Skin is warm and dry. No rashes noted. Psychiatric: Normal mood and affect. Behavior is normal.  RELEVANT LABS AND IMAGING: CBC    Component Value Date/Time   WBC 7.9 02/08/2013 2100   RBC 5.48* 02/08/2013 2100   HGB 11.9* 02/08/2013 2100   HCT 39.3 02/08/2013 2100   PLT 292 02/08/2013 2100   MCV 71.7* 02/08/2013 2100   MCH 21.7* 02/08/2013 2100   MCHC 30.3 02/08/2013 2100   RDW 16.9* 02/08/2013 2100   LYMPHSABS 1.8 02/08/2013 2100   MONOABS 0.4 02/08/2013 2100   EOSABS 0.1 02/08/2013 2100   BASOSABS 0.0 02/08/2013 2100    CMP     Component Value Date/Time   NA 139 02/08/2013 2100   K 3.8 02/08/2013 2100   CL 101 02/08/2013 2100   CO2 27 02/08/2013 2100   GLUCOSE 108* 02/08/2013 2100   BUN 14 02/08/2013 2100   CREATININE 1.17* 02/08/2013 2100   CALCIUM 9.6 02/08/2013 2100   PROT 8.3 02/08/2013 2100   ALBUMIN 4.0 02/08/2013 2100   AST 25 02/08/2013 2100   ALT 15 02/08/2013 2100   ALKPHOS 82 02/08/2013 2100   BILITOT 0.4 02/08/2013 2100   GFRNONAA 55* 02/08/2013 2100   GFRAA 63* 02/08/2013 2100   CT ABDOMEN AND PELVIS WITH CONTRAST -- 2011    Technique:  Multidetector CT imaging of the abdomen and pelvis was performed following the standard protocol during bolus administration of intravenous contrast.    Contrast: 100 mL of Omnipaque 300 IV contrast    Comparison: CT of the abdomen and pelvis performed 06/03/2006    Findings: The visualized lung bases are clear.    The liver and spleen are unremarkable in appearance.  The patient is status post cholecystectomy, with clips noted at the gallbladder fossa.  The pancreas and adrenal glands are unremarkable.  The kidneys are within normal limits bilaterally; no hydronephrosis or perinephric stranding is seen.  Minimal perinephric fluid is noted posteriorly on the right side.    There is segmental wall thickening involving a segment of mid ileum along the left  mid-abdomen and left lower quadrant, raising question for mild ileitis.  Fluid is noted tracking along the adjacent mesentery.  The stomach is within normal limits.  No acute vascular abnormalities are seen.    The appendix is normal in caliber, and contains air, without evidence for appendicitis.  The transverse colon is redundant.  The colon is otherwise unremarkable in appearance.  Trace fluid along the left paracolic gutter may reflect the adjacent inflammatory process involving the small bowel.    A small amount of free fluid is noted within the pelvis.  The fluid appears relatively symmetric, without definite evidence of a ruptured cyst.  The  bladder is mildly distended and is unremarkable in appearance.  The uterus is grossly unremarkable in appearance.  The ovaries are grossly unremarkable.  No inguinal lymphadenopathy is seen.  No acute osseous abnormalities are identified.    IMPRESSION: Segmental wall thickening involving a segment of the mid ileum, along the left mid abdomen and left lower quadrant, with fluid tracking along the adjacent mesentery and left paracolic gutter, and a small amount of free fluid in the pelvis.  Findings raise concern for mild ileitis, either infectious or inflammatory in [employer] [facility] [employer] [facility].  ____________________________________________________________________________ CT ABDOMEN AND PELVIS WITH CONTRAST -- DEC 2014   TECHNIQUE: Multidetector CT imaging of the abdomen and pelvis was performed using the standard protocol following bolus administration of intravenous contrast.   CONTRAST:  48m OMNIPAQUE IOHEXOL 300 MG/ML SOLN, 1058mOMNIPAQUE IOHEXOL 300 MG/ML SOLN   COMPARISON:  01/07/2010.   FINDINGS: Lung Bases: Clear.   Liver: Minimal postcholecystectomy intrahepatic biliary ductal dilation. Small amount of perihepatic ascites.   Spleen:  Normal.   Gallbladder:  Surgically absent.   Common bile duct:  Normal.   Pancreas: Mild  prominence of pancreatic duct. No obstructing mass lesion identified. This may relate to sphincter dysfunction.   Adrenal glands:  Normal.   Kidneys: Normal enhancement. Normal delayed excretion of contrast bilaterally. The ureters appear within normal limits.   Stomach:  Normal.   Small bowel: Duodenum appears normal. Laboratory changes of small bowel are present in the central abdomen and extending into the anatomic pelvis. There is mural thickening with mesenteric fat stranding and ascites. The visceral vasculature appears adequately patent.   Colon: Rectum appears normal. The colon is predominantly decompressed. Fluid levels are present within the proximal colon. Normal appendix.   Pelvic Genitourinary: Uterus and adnexa appear within normal limits.   Bones: No aggressive osseous lesions. Calcific tendinitis of the common hamstring origins. Bilateral SI joint degenerative disease. Lumbar spondylosis is mild.   Vasculature: Normal.   Body Wall: Tiny fat containing periumbilical hernia.   IMPRESSION: 1. Segmental small bowel all inflammatory changes. This is favored to represent inflammatory bowel disease. Ischemia or infection are considered less likely. 2. Small amount of reactive ascites. 3. Cholecystectomy.     ASSESSMENT/PLAN: [age] female with PMH of hypertension and also an abdominal pain for the last several years who is seen in consultation at the request of Dr. PrKennon Roundso evaluate abdominal pain.    1.  Abd pain/nausea/enteritis/abnormal GI imaging -- her symptoms have been present for years and she's had cross-sectional imaging performed in 2011 and recently which showed segmental small bowel inflammatory changes. This is highly suspicious for Crohn's disease as the cause of her symptoms. I have recommended upper endoscopy and colonoscopy to evaluate her symptoms and try to obtain tissue diagnosis. I recommended a low fiber/low residue diet. She can continue when  necessary Phenergan for nausea and Percocet for pain. She has previously been prescribed Percocet. She understands that this is simply covering the pain. I will prescribe Levsin to be used as needed and as directed for cramping which may help more specifically with bowel cramping. If endoscopy is normal, capsule endoscopy would be the next consideration. I am checking labs today to include CRP, ESR and extended IBD panel  2.  Microcytic anemia -- this is often seen in persistent inflammatory bowel disease. Recent labs revealed a microcytic anemia. I am checking iron studies today.  Further recommendations after labs and procedures

## 2013-03-17 ENCOUNTER — Telehealth: Payer: Self-pay | Admitting: *Deleted

## 2013-03-17 MED ORDER — INTEGRA F 125-1 MG PO CAPS: 1.0000 | ORAL_CAPSULE | Freq: Every day | ORAL | Status: DC

## 2013-03-17 NOTE — Telephone Encounter (Signed)
Message copied by Lance Morin on Wed Mar 17, 2013  4:49 PM ------      Message from: Jerene Bears      Created: Mon Mar 15, 2013  5:21 PM       Anemia with iron deficiency      She has upper endoscopy and colonoscopy pending      Inflammatory markers are elevated likely related to small bowel inflammation which we are working up      She needs to start iron replacement but can wait until after endoscopies      Integra 1 capsule daily after upper and lower endoscopy ------

## 2013-03-17 NOTE — Telephone Encounter (Signed)
Informed pt of results and reminded her of upcoming procedures. Ordered Integra and informed her she is not to start until after her procedures; she stated understanding.

## 2013-03-18 LAB — IBD EXPANDED PANEL
ACCA: 36 units (ref 0–90)
ALCA: 9 units (ref 0–60)
AMCA: 21 units (ref 0–100)
Atypical pANCA: NEGATIVE
gASCA: 31 units (ref 0–50)

## 2013-03-19 ENCOUNTER — Encounter: Payer: Self-pay | Admitting: Internal Medicine

## 2013-03-19 ENCOUNTER — Ambulatory Visit (AMBULATORY_SURGERY_CENTER): Payer: Medicaid Other | Admitting: Internal Medicine

## 2013-03-19 VITALS — BP 154/81 | HR 67 | Temp 99.0°F | Resp 16 | Ht 65.0 in | Wt 214.0 lb

## 2013-03-19 DIAGNOSIS — R110 Nausea: Secondary | ICD-10-CM

## 2013-03-19 DIAGNOSIS — R933 Abnormal findings on diagnostic imaging of other parts of digestive tract: Secondary | ICD-10-CM

## 2013-03-19 DIAGNOSIS — D509 Iron deficiency anemia, unspecified: Secondary | ICD-10-CM

## 2013-03-19 MED ORDER — SODIUM CHLORIDE 0.9 % IV SOLN: 500.0000 mL | INTRAVENOUS | Status: DC

## 2013-03-19 NOTE — Patient Instructions (Addendum)
YOU HAD AN ENDOSCOPIC PROCEDURE TODAY AT THE [facility]: Refer to the procedure report that was given to you for any specific questions about what was found during the examination.  If the procedure report does not answer your questions, please call your gastroenterologist to clarify.  If you requested that your care partner not be given the details of your procedure findings, then the procedure report has been included in a sealed envelope for you to review at your convenience later.  YOU SHOULD EXPECT: Some feelings of bloating in the abdomen. Passage of more gas than usual.  Walking can help get rid of the air that was put into your GI tract during the procedure and reduce the bloating. If you had a lower endoscopy (such as a colonoscopy or flexible sigmoidoscopy) you may notice spotting of blood in your stool or on the toilet paper. If you underwent a bowel prep for your procedure, then you may not have a normal bowel movement for a few days.  DIET: Your first meal following the procedure should be a light meal and then it is ok to progress to your normal diet.  A half-sandwich or bowl of soup is an example of a good first meal.  Heavy or fried foods are harder to digest and may make you feel nauseous or bloated.  Likewise meals heavy in dairy and vegetables can cause extra gas to form and this can also increase the bloating.  Drink plenty of fluids but you should avoid alcoholic beverages for 24 hours.  ACTIVITY: Your care partner should take you home directly after the procedure.  You should plan to take it easy, moving slowly for the rest of the day.  You can resume normal activity the day after the procedure however you should NOT DRIVE or use heavy machinery for 24 hours (because of the sedation medicines used during the test).    SYMPTOMS TO REPORT IMMEDIATELY: A gastroenterologist can be reached at any hour.  During normal business hours, 8:30 AM to 5:00 PM Monday through Friday,  call [phone].  After hours and on weekends, please call the GI answering service at [phone] who will take a message and have the physician on call contact you.   Following lower endoscopy (colonoscopy or flexible sigmoidoscopy):  Excessive amounts of blood in the stool  Significant tenderness or worsening of abdominal pains  Swelling of the abdomen that is new, acute  Fever of 100F or higher  Following upper endoscopy (EGD)  Vomiting of blood or coffee ground material  New chest pain or pain under the shoulder blades  Painful or persistently difficult swallowing  New shortness of breath  Fever of 100F or higher  Black, tarry-looking stools  FOLLOW UP: If any biopsies were taken you will be contacted by phone or by letter within the next 1-3 weeks.  Call your gastroenterologist if you have not heard about the biopsies in 3 weeks.  Our staff will call the home number listed on your records the next business day following your procedure to check on you and address any questions or concerns that you may have at that time regarding the information given to you following your procedure. This is a courtesy call and so if there is no answer at the home number and we have not heard from you through the emergency physician on call, we will assume that you have returned to your regular daily activities without incident.  SIGNATURES/CONFIDENTIALITY: You and/or your care   partner have signed paperwork which will be entered into your electronic medical record.  These signatures attest to the fact that that the information above on your After Visit Summary has been reviewed and is understood.  Full responsibility of the confidentiality of this discharge information lies with you and/or your care-partner.  

## 2013-03-19 NOTE — Op Note (Signed)
[facility]  [address]. [place], [address]   ENDOSCOPY PROCEDURE REPORT  PATIENT: Gabriela Lowe, Gabriela Lowe  MR#: [id number] BIRTHDATE: 1965-08-08 , 89  yrs. old GENDER: Female ENDOSCOPIST: Jerene Bears, MD REFERRED BY:  Jeannetta Nap, M.D. PROCEDURE DATE:  03/19/2013 PROCEDURE:  EGD, diagnostic ASA CLASS:     Class II INDICATIONS:  abdominal pain.   Iron deficiency anemia.   abnormal CT of the GI tract.   Nausea. MEDICATIONS: MAC sedation, administered by CRNA and propofol (Diprivan) 200mg  IV TOPICAL ANESTHETIC: Cetacaine Spray  DESCRIPTION OF PROCEDURE: After the risks benefits and alternatives of the procedure were thoroughly explained, informed consent was obtained.  The LB [id number] endoscope was introduced through the mouth and advanced to the second portion of the duodenum. Without limitations.  The instrument was slowly withdrawn as the mucosa was fully examined.    ESOPHAGUS: A partial Schatzki ring was found at the gastroesophageal junction and was widely open.   A 2 cm hiatal hernia was noted. The esophagus was otherwise normal.  STOMACH: The mucosa of the stomach appeared normal.  DUODENUM: The duodenal mucosa showed no abnormalities in the bulb and second portion of the duodenum and 3rd part of the duodenum. Retroflexed views revealed a small hiatal hernia.     The scope was then withdrawn from the patient and the procedure completed.  COMPLICATIONS: There were no complications.  ENDOSCOPIC IMPRESSION: 1.   Partial and non-obstructing Schatzki's ring was found at the gastroesophageal junction 2.   2 cm hiatal hernia 3.   The esophagus was otherwise normal. 4.   The mucosa of the stomach appeared normal 5.   The duodenal mucosa showed no abnormalities in the bulb and second portion of the duodenum and 3rd part of the duodenum  RECOMMENDATIONS: Colonoscopy today  eSigned:  Jerene Bears, MD 03/19/2013 4:14 PMn CC:The Patient and Jeannetta Nap, MD

## 2013-03-19 NOTE — Op Note (Signed)
[place]  [address]. [place], [address]   COLONOSCOPY PROCEDURE REPORT  PATIENT: Gabriela Lowe, Gabriela Lowe  MR#: [id number] BIRTHDATE: 10-Sep-1965 , 58  yrs. old GENDER: Female ENDOSCOPIST: Jerene Bears, MD REFERRED MV:[facility] Pratt, M.D. PROCEDURE DATE:  03/19/2013 PROCEDURE:   Colonoscopy, diagnostic First Screening Colonoscopy - Avg.  risk and is 50 yrs.  old or older - No.  Prior Negative Screening - Now for repeat screening. N/A  History of Adenoma - Now for follow-up colonoscopy & has been > or = to 3 yrs.  N/A  Polyps Removed Today? No.  Recommend repeat exam, <10 yrs? No. ASA CLASS:   Class II INDICATIONS:an abnormal CT, Abdominal pain, and Iron Deficiency Anemia. MEDICATIONS: MAC sedation, administered by CRNA and propofol (Diprivan) 100mg  IV  DESCRIPTION OF PROCEDURE:   After the risks benefits and alternatives of the procedure were thoroughly explained, informed consent was obtained.  A digital rectal exam revealed external hemorrhoids.   The LB [id number]  endoscope was introduced through the anus and advanced to the terminal ileum which was intubated for a short distance. No adverse events experienced. The quality of the prep was excellent, using MoviPrep  The instrument was then slowly withdrawn as the colon was fully examined.   COLON FINDINGS: The mucosa appeared normal in the terminal ileum. A normal appearing cecum, ileocecal valve, and appendiceal orifice were identified.  The ascending, hepatic flexure, transverse, splenic flexure, descending, sigmoid colon and rectum appeared unremarkable.  No polyps or cancers were seen.  Retroflexed views revealed external hemorrhoids. The time to cecum=3 minutes 28 seconds.  Withdrawal time=10 minutes 34 seconds.  The scope was withdrawn and the procedure completed.  COMPLICATIONS: There were no complications.  ENDOSCOPIC IMPRESSION: 1.   Normal mucosa in the terminal ileum 2.   Normal  colon  RECOMMENDATIONS: 1.  You should continue to follow colorectal cancer screening guidelines for "routine risk" patients with a repeat colonoscopy in 10 years.  There is no need for FOBT (stool) testing for at least 5 years. 2.  Proceed to video capsule endoscopy to evaluate iron deficiency anemia and abnormal CT findings   eSigned:  Jerene Bears, MD 03/19/2013 4:17 PM   cc: The Patient and Jeannetta Nap, MD

## 2013-03-22 ENCOUNTER — Telehealth: Payer: Self-pay | Admitting: *Deleted

## 2013-03-22 NOTE — Telephone Encounter (Signed)
  Follow up Call-  Call back number 03/19/2013  Post procedure Call Back phone  # [phone]  Permission to leave phone message Yes     Patient questions:  Do you have a fever, pain , or abdominal swelling? no Pain Score  0 *  Have you tolerated food without any problems? yes  Have you been able to return to your normal activities? yes  Do you have any questions about your discharge instructions: Diet   no Medications  no Follow up visit  no  Do you have questions or concerns about your Care? no  Actions: * If pain score is 4 or above: No action needed, pain <4.

## 2013-03-31 ENCOUNTER — Telehealth: Payer: Self-pay | Admitting: *Deleted

## 2013-03-31 NOTE — Telephone Encounter (Signed)
lmom for pt to call back to schedule a capsule endo

## 2013-03-31 NOTE — Telephone Encounter (Signed)
Scheduled pt for her capsule teaching on 04/09/13.

## 2013-04-23 ENCOUNTER — Ambulatory Visit (INDEPENDENT_AMBULATORY_CARE_PROVIDER_SITE_OTHER): Payer: Medicaid Other | Admitting: Internal Medicine

## 2013-04-23 DIAGNOSIS — R112 Nausea with vomiting, unspecified: Secondary | ICD-10-CM

## 2013-04-23 DIAGNOSIS — R110 Nausea: Secondary | ICD-10-CM

## 2013-04-23 DIAGNOSIS — R933 Abnormal findings on diagnostic imaging of other parts of digestive tract: Secondary | ICD-10-CM

## 2013-04-23 DIAGNOSIS — J069 Acute upper respiratory infection, unspecified: Secondary | ICD-10-CM

## 2013-04-23 DIAGNOSIS — D509 Iron deficiency anemia, unspecified: Secondary | ICD-10-CM

## 2013-04-23 DIAGNOSIS — D500 Iron deficiency anemia secondary to blood loss (chronic): Secondary | ICD-10-CM

## 2013-04-23 DIAGNOSIS — K5000 Crohn's disease of small intestine without complications: Secondary | ICD-10-CM

## 2013-04-23 DIAGNOSIS — R109 Unspecified abdominal pain: Secondary | ICD-10-CM

## 2013-04-23 NOTE — Progress Notes (Signed)
Patient here for capsule endo.  She tolerated procedure well.  Returned this pm with no complaints Capsule ID [id number] number [id number] exp 04/2014

## 2013-05-05 ENCOUNTER — Other Ambulatory Visit: Payer: Self-pay

## 2013-05-05 MED ORDER — INTEGRA F 125-1 MG PO CAPS: 1.0000 | ORAL_CAPSULE | Freq: Every day | ORAL | Status: DC

## 2013-05-06 ENCOUNTER — Encounter: Payer: Self-pay | Admitting: Internal Medicine

## 2013-06-22 ENCOUNTER — Encounter: Payer: Self-pay | Admitting: Internal Medicine

## 2013-06-28 ENCOUNTER — Encounter: Payer: Self-pay | Admitting: Internal Medicine

## 2013-06-28 ENCOUNTER — Other Ambulatory Visit (INDEPENDENT_AMBULATORY_CARE_PROVIDER_SITE_OTHER): Payer: Medicaid Other

## 2013-06-28 ENCOUNTER — Ambulatory Visit (INDEPENDENT_AMBULATORY_CARE_PROVIDER_SITE_OTHER): Payer: Medicaid Other | Admitting: Internal Medicine

## 2013-06-28 VITALS — BP 130/84 | HR 70 | Ht 65.0 in | Wt 215.8 lb

## 2013-06-28 DIAGNOSIS — D649 Anemia, unspecified: Secondary | ICD-10-CM

## 2013-06-28 DIAGNOSIS — R933 Abnormal findings on diagnostic imaging of other parts of digestive tract: Secondary | ICD-10-CM

## 2013-06-28 DIAGNOSIS — D509 Iron deficiency anemia, unspecified: Secondary | ICD-10-CM

## 2013-06-28 LAB — IBC PANEL
Iron: 20 ug/dL — ABNORMAL LOW (ref 42–145)
Saturation Ratios: 3.9 % — ABNORMAL LOW (ref 20.0–50.0)
Transferrin: 366.9 mg/dL — ABNORMAL HIGH (ref 212.0–360.0)

## 2013-06-28 LAB — CBC
HCT: 32.8 % — ABNORMAL LOW (ref 36.0–46.0)
Hemoglobin: 10.1 g/dL — ABNORMAL LOW (ref 12.0–15.0)
MCHC: 30.7 g/dL (ref 30.0–36.0)
MCV: 72.7 fl — ABNORMAL LOW (ref 78.0–100.0)
Platelets: 278 10*3/uL (ref 150.0–400.0)
RBC: 4.52 Mil/uL (ref 3.87–5.11)
RDW: 18.2 % — ABNORMAL HIGH (ref 11.5–15.5)
WBC: 6 10*3/uL (ref 4.0–10.5)

## 2013-06-28 LAB — FERRITIN: Ferritin: 4.1 ng/mL — ABNORMAL LOW (ref 10.0–291.0)

## 2013-06-28 MED ORDER — HYOSCYAMINE SULFATE 0.125 MG SL SUBL: 0.1250 mg | SUBLINGUAL_TABLET | SUBLINGUAL | Status: DC | PRN

## 2013-06-28 NOTE — Progress Notes (Signed)
Subjective:    Patient ID: Gabriela Lowe, female    DOB: August 01, 1965, 48 y.o.   MRN: [id number]  HPI Gabriela Lowe is a 48 year old female with a past medical history of hypertension and several years of intermittent abdominal pain who is seen in followup. She was initially seen after CT scan revealed into right is concerning for possible Crohn's disease. She had an upper and lower endoscopy on 03/19/2013 which was largely unrevealing. She partial nonobstructing Schatzki's ring with hiatus hernia but no evidence for inflammation. Colonoscopy with examination of the terminal ileum was unremarkable except for external hemorrhoids. Laboratory testing was done including an IBD panel which was not suggestive of Crohn's she then had a video capsule endoscopy which was normal. She is here today for followup.  He reports he is feeling well. She has been using Integra for iron replacement but this upsets her stomach. She has tried skipping it and when she does her stomach does not bother her. She has been taking it despite upset stomach. She occasionally uses Levsin for cramping. She denies abdominal pain. No nausea or vomiting. No rectal bleeding. No melena. No trouble swallowing. No hepatobiliary complaints. She does monitor her diet and tries to avoid foods such as peanuts and corn because they can cause some GI distress. Energy levels been good. No joint pains, rashes,Oral ulcers or eye complaints. Periods are fairly heavy but occur regularly and last 3-5 days.   Review of Systems As per history of present illness, otherwise negative  Current Medications, Allergies, Past Medical History, Past Surgical History, Family History and Social History were reviewed in [employer] record.     Objective:   Physical Exam BP 130/84  Pulse 70  Ht 5\' 5"  (1.651 m)  Wt 215 lb 12.8 oz (97.886 kg)  BMI 35.91 kg/m2  LMP 06/19/2013 Constitutional: Well-developed and well-nourished. No  distress. HEENT: Normocephalic and atraumatic. Oropharynx is clear and moist. No oropharyngeal exudate. Conjunctivae are normal.  No scleral icterus. Cardiovascular: Normal rate, regular rhythm and intact distal pulses. No M/R/G Pulmonary/chest: Effort normal and breath sounds normal. No wheezing, rales or rhonchi. Abdominal: Soft, nontender, nondistended. Bowel sounds active throughout. There are no masses palpable. No hepatosplenomegaly. Extremities: no clubbing, cyanosis, or edema Neurological: Alert and oriented to person place and time. Psychiatric: Normal mood and affect. Behavior is normal.  EGD/colon/VCE reviewed  CBC    Component Value Date/Time   WBC 5.4 03/15/2013 0929   RBC 4.32 03/15/2013 0929   HGB 9.3* 03/15/2013 0929   HCT 30.6* 03/15/2013 0929   PLT 281.0 03/15/2013 0929   MCV 71.0* 03/15/2013 0929   MCH 21.7* 02/08/2013 2100   MCHC 30.3 03/15/2013 0929   RDW 18.7* 03/15/2013 0929   LYMPHSABS 1.8 02/08/2013 2100   MONOABS 0.4 02/08/2013 2100   EOSABS 0.1 02/08/2013 2100   BASOSABS 0.0 02/08/2013 2100    Iron/TIBC/Ferritin    Component Value Date/Time   IRON 17* 03/15/2013 0929   FERRITIN 4.0* 03/15/2013 0929       Assessment & Plan:  48 year old female with a past medical history of hypertension and several years of intermittent abdominal pain who is seen in followup.  1. IDA/enteritis by CT/normal EGD/colon and VCE -- I do not have any objective evidence of IBD which is good news. Her video capsule was normal and her IBD panel was also negative. She is feeling well with no evidence or symptoms of active inflammatory bowel disease. This is very good news.  No further workup planned given lack of GI symptoms. I would like to ensure her anemia has improved. Check CBC and iron studies today. Stopping Integra because of intolerance. If she remains iron deficient, I would recommend IV iron. She can return as needed and should followup with her primary care physician.

## 2013-06-28 NOTE — Patient Instructions (Signed)
Your physician has requested that you go to the basement for the following lab work before leaving today: iron Studies  We have sent the following medications to your pharmacy for you to pick up at your convenience: Levsin  Follow up to be determined upon lab results

## 2013-06-29 ENCOUNTER — Other Ambulatory Visit: Payer: Self-pay

## 2013-06-29 DIAGNOSIS — D509 Iron deficiency anemia, unspecified: Secondary | ICD-10-CM

## 2013-07-19 ENCOUNTER — Other Ambulatory Visit (HOSPITAL_COMMUNITY): Payer: Self-pay | Admitting: Internal Medicine

## 2013-07-19 ENCOUNTER — Encounter (HOSPITAL_COMMUNITY): Payer: Self-pay

## 2013-07-19 ENCOUNTER — Encounter (HOSPITAL_COMMUNITY)
Admission: RE | Admit: 2013-07-19 | Discharge: 2013-07-19 | Disposition: A | Discharge status: Home / Self Care | Payer: Self-pay | Source: Ambulatory Visit | Attending: Internal Medicine | Admitting: Internal Medicine

## 2013-07-19 VITALS — BP 144/79 | HR 54 | Temp 98.9°F | Resp 18 | Ht 65.0 in | Wt 215.0 lb

## 2013-07-19 DIAGNOSIS — D509 Iron deficiency anemia, unspecified: Secondary | ICD-10-CM | POA: Insufficient documentation

## 2013-07-19 MED ORDER — IRON DEXTRAN 50 MG/ML IJ SOLN: 25.0000 mg | Freq: Once | INTRAMUSCULAR | Status: DC

## 2013-07-19 MED ORDER — SODIUM CHLORIDE 0.9 % IV SOLN: 25.0000 mg | Freq: Once | INTRAVENOUS | Status: DC

## 2013-07-19 MED ORDER — SODIUM CHLORIDE 0.9 % IV SOLN
1000.0000 mg | Freq: Once | INTRAVENOUS | Status: AC
  Administered 2013-07-19: 1000 mg via INTRAVENOUS
  Filled 2013-07-19: qty 20

## 2013-07-19 MED ORDER — SODIUM CHLORIDE 0.9 % IV SOLN
Freq: Once | INTRAVENOUS | Status: AC
  Administered 2013-07-19: 08:00:00 via INTRAVENOUS

## 2013-07-19 MED ORDER — SODIUM CHLORIDE 0.9 % IV SOLN
25.0000 mg | Freq: Once | INTRAVENOUS | Status: AC
  Administered 2013-07-19: 25 mg via INTRAVENOUS
  Filled 2013-07-19: qty 0.5

## 2013-07-19 NOTE — Progress Notes (Signed)
Iron dextran infusion complete.  Pt tolerated well.  Vss, afebrile.  Pt d/c home ambulatory and pt states I feel fine.

## 2013-07-19 NOTE — Discharge Instructions (Signed)

## 2013-11-08 ENCOUNTER — Encounter (HOSPITAL_COMMUNITY): Payer: Self-pay | Admitting: Emergency Medicine

## 2013-11-08 ENCOUNTER — Emergency Department (INDEPENDENT_AMBULATORY_CARE_PROVIDER_SITE_OTHER)
Admission: EM | Admit: 2013-11-08 | Discharge: 2013-11-08 | Disposition: A | Discharge status: Home / Self Care | Payer: Self-pay | Source: Home / Self Care | Attending: Family Medicine | Admitting: Family Medicine

## 2013-11-08 DIAGNOSIS — M5431 Sciatica, right side: Secondary | ICD-10-CM

## 2013-11-08 DIAGNOSIS — M5430 Sciatica, unspecified side: Secondary | ICD-10-CM

## 2013-11-08 MED ORDER — METHYLPREDNISOLONE 4 MG PO KIT: PACK | ORAL | Status: DC

## 2013-11-08 MED ORDER — CYCLOBENZAPRINE HCL 5 MG PO TABS: 5.0000 mg | ORAL_TABLET | Freq: Three times a day (TID) | ORAL | Status: DC | PRN

## 2013-11-08 MED ORDER — KETOROLAC TROMETHAMINE 30 MG/ML IJ SOLN
30.0000 mg | Freq: Once | INTRAMUSCULAR | Status: AC
  Administered 2013-11-08: 30 mg via INTRAMUSCULAR

## 2013-11-08 MED ORDER — KETOROLAC TROMETHAMINE 30 MG/ML IJ SOLN
INTRAMUSCULAR | Status: AC
  Filled 2013-11-08: qty 1

## 2013-11-08 NOTE — ED Provider Notes (Signed)
CSN: [id number]     Arrival date & time 11/08/13  1723 History   First MD Initiated Contact with Patient 11/08/13 1818     No chief complaint on file.  (Consider location/radiation/quality/duration/timing/severity/associated sxs/prior Treatment) Patient is a 48 y.o. female presenting with back pain. The history is provided by the patient.  Back Pain Location:  Sacro-iliac joint Quality:  Stabbing Radiates to:  R foot, R knee and R thigh Pain severity:  Moderate Duration:  3 days Chronicity:  New Context: not lifting heavy objects   Relieved by:  None tried Worsened by:  Nothing tried Associated symptoms: numbness   Associated symptoms: no abdominal pain, no abdominal swelling, no bladder incontinence, no bowel incontinence, no paresthesias, no pelvic pain, no tingling and no weakness   Risk factors: obesity   Risk factors: no steroid use     Past Medical History  Diagnosis Date  . Hypertension   . Bronchitis    Past Surgical History  Procedure Laterality Date  . Cesarean section      x 2  . Cholecystectomy     Family History  Problem Relation Age of Onset  . Hypertension Mother   . Diabetes Mother   . Diabetes Father   . Diabetes Brother    History  Substance Use Topics  . Smoking status: Never Smoker   . Smokeless tobacco: Never Used  . Alcohol Use: No   OB History   Grav Para Term Preterm Abortions TAB SAB Ect Mult Living                 Review of Systems  Constitutional: Negative.   Gastrointestinal: Negative.  Negative for abdominal pain and bowel incontinence.  Genitourinary: Negative for bladder incontinence, flank pain and pelvic pain.  Musculoskeletal: Positive for back pain. Negative for gait problem.  Skin: Negative.   Neurological: Positive for numbness. Negative for tingling, weakness and paresthesias.    Allergies  Review of patient's allergies indicates no known allergies.  Home Medications   Prior to Admission medications   Medication  Sig Start Date End Date Taking? Authorizing Provider  cyclobenzaprine (FLEXERIL) 5 MG tablet Take 1 tablet (5 mg total) by mouth 3 (three) times daily as needed for muscle spasms. 11/08/13   Billy Fischer, MD  fluticasone (FLONASE) 50 MCG/ACT nasal spray Place 2 sprays into both nostrils daily. 01/10/13   Harden Mo, MD  hyoscyamine (LEVSIN SL) 0.125 MG SL tablet Place 1 tablet (0.125 mg total) under the tongue every 4 (four) hours as needed. 06/28/13   Jerene Bears, MD  lisinopril-hydrochlorothiazide (PRINZIDE,ZESTORETIC) 20-25 MG per tablet Take 1 tablet by mouth daily.    Historical Provider, MD  methylPREDNISolone (MEDROL DOSEPAK) 4 MG tablet follow package directions, start on tues, take until finished 11/08/13   Billy Fischer, MD  Multiple Vitamin (MULTIVITAMIN WITH MINERALS) TABS tablet Take 1 tablet by mouth daily.    Historical Provider, MD  promethazine (PHENERGAN) 25 MG tablet Take 1 tablet (25 mg total) by mouth every 6 (six) hours as needed for nausea or vomiting. 02/08/13   Hoy Morn, MD   BP 150/84  Pulse 66  Temp(Src) 98.6 F (37 C) (Oral)  Resp 16  SpO2 98% Physical Exam  Nursing note and vitals reviewed. Constitutional: She is oriented to person, place, and time. She appears well-developed and well-nourished.  Musculoskeletal: She exhibits tenderness.       Lumbar back: She exhibits bony tenderness. She exhibits normal range of  motion, no deformity, no pain, no spasm and normal pulse.       Back:  Neurological: She is alert and oriented to person, place, and time.  Skin: Skin is warm.    ED Course  Procedures (including critical care time) Labs Review Labs Reviewed - No data to display  Imaging Review No results found.   MDM   1. Sciatica neuralgia, right        Billy Fischer, MD 11/08/13 [phone]

## 2013-11-08 NOTE — Discharge Instructions (Signed)
Use medicine as prescribed, see orthopedist if further problems.

## 2013-11-08 NOTE — ED Notes (Signed)
Reports back and leg pain, right side.  Onset Friday (9/18)

## 2014-01-07 ENCOUNTER — Telehealth: Payer: Self-pay | Admitting: *Deleted

## 2014-01-07 ENCOUNTER — Other Ambulatory Visit: Payer: Self-pay | Admitting: Internal Medicine

## 2014-01-07 NOTE — Telephone Encounter (Signed)
I have gotten a prior authorization request from patient's pharmacy for hyoscyamine. I have contacted medicaid (whom pharmacy is trying to run claim through). Per medicaid, patient only has "family planning" with them at this time. Therefore, no medications from [place] would be covered through them. I have advised Hydrologist of this. He verbalizes understanding.

## 2014-03-18 ENCOUNTER — Emergency Department (HOSPITAL_COMMUNITY)
Admission: EM | Admit: 2014-03-18 | Discharge: 2014-03-18 | Disposition: A | Discharge status: Home / Self Care | Payer: Self-pay | Attending: Emergency Medicine | Admitting: Emergency Medicine

## 2014-03-18 ENCOUNTER — Encounter (HOSPITAL_COMMUNITY): Payer: Self-pay | Admitting: *Deleted

## 2014-03-18 DIAGNOSIS — Z791 Long term (current) use of non-steroidal anti-inflammatories (NSAID): Secondary | ICD-10-CM | POA: Insufficient documentation

## 2014-03-18 DIAGNOSIS — I10 Essential (primary) hypertension: Secondary | ICD-10-CM | POA: Insufficient documentation

## 2014-03-18 DIAGNOSIS — M79672 Pain in left foot: Secondary | ICD-10-CM

## 2014-03-18 DIAGNOSIS — Z7951 Long term (current) use of inhaled steroids: Secondary | ICD-10-CM | POA: Insufficient documentation

## 2014-03-18 DIAGNOSIS — Z8709 Personal history of other diseases of the respiratory system: Secondary | ICD-10-CM | POA: Insufficient documentation

## 2014-03-18 DIAGNOSIS — M79671 Pain in right foot: Secondary | ICD-10-CM | POA: Insufficient documentation

## 2014-03-18 DIAGNOSIS — Z79899 Other long term (current) drug therapy: Secondary | ICD-10-CM | POA: Insufficient documentation

## 2014-03-18 MED ORDER — TRAMADOL HCL 50 MG PO TABS: 50.0000 mg | ORAL_TABLET | Freq: Four times a day (QID) | ORAL | Status: DC | PRN

## 2014-03-18 NOTE — ED Notes (Signed)
Bed: [facility] Expected date:  Expected time:  Means of arrival:  Comments: 

## 2014-03-18 NOTE — ED Notes (Signed)
Pt reports "knots" have appeared on both feet. Hard nodule noted to lateral aspect of each foot. Pt sts R more painful than left. Has appeared and worsened over past month.

## 2014-03-18 NOTE — ED Provider Notes (Signed)
CSN: [id number]     Arrival date & time 03/18/14  0902 History   First MD Initiated Contact with Patient 03/18/14 1012     Chief Complaint  Patient presents with  . Foot Pain     (Consider location/radiation/quality/duration/timing/severity/associated sxs/prior Treatment) Patient is a 49 y.o. female presenting with lower extremity pain. The history is provided by the patient.  Foot Pain This is a new problem. The current episode started more than 1 week ago. The problem occurs constantly. The problem has not changed since onset.Pertinent negatives include no abdominal pain and no shortness of breath. Nothing aggravates the symptoms. Nothing relieves the symptoms. She has tried nothing for the symptoms.    Past Medical History  Diagnosis Date  . Hypertension   . Bronchitis    Past Surgical History  Procedure Laterality Date  . Cesarean section      x 2  . Cholecystectomy     Family History  Problem Relation Age of Onset  . Hypertension Mother   . Diabetes Mother   . Diabetes Father   . Diabetes Brother    History  Substance Use Topics  . Smoking status: Never Smoker   . Smokeless tobacco: Never Used  . Alcohol Use: No   OB History    No data available     Review of Systems  Constitutional: Negative for fever.  Respiratory: Negative for shortness of breath.   Gastrointestinal: Negative for vomiting and abdominal pain.  All other systems reviewed and are negative.     Allergies  Review of patient's allergies indicates no known allergies.  Home Medications   Prior to Admission medications   Medication Sig Start Date End Date Taking? Authorizing Provider  acetaminophen (TYLENOL) 500 MG tablet Take 1,000 mg by mouth every 6 (six) hours as needed (for pain.).   Yes Historical Provider, MD  diclofenac sodium (VOLTAREN) 1 % GEL Apply 2 g topically 4 (four) times daily as needed (For pain.).   Yes Historical Provider, MD  fluticasone (FLONASE) 50 MCG/ACT nasal spray  Place 2 sprays into both nostrils daily. 01/10/13  Yes Harden Mo, MD  hyoscyamine (LEVSIN SL) 0.125 MG SL tablet Place 0.125 mg under the tongue every 4 (four) hours as needed for cramping.   Yes Historical Provider, MD  lidocaine (LIDODERM) 5 % Place 1 patch onto the skin daily as needed (Applies to feet for pain.). Remove & Discard patch within 12 hours or as directed by MD   Yes Historical Provider, MD  lisinopril-hydrochlorothiazide (PRINZIDE,ZESTORETIC) 20-25 MG per tablet Take 1 tablet by mouth daily.   Yes Historical Provider, MD  Multiple Vitamin (MULTIVITAMIN WITH MINERALS) TABS tablet Take 1 tablet by mouth daily.   Yes Historical Provider, MD  cyclobenzaprine (FLEXERIL) 5 MG tablet Take 1 tablet (5 mg total) by mouth 3 (three) times daily as needed for muscle spasms. Patient not taking: Reported on 03/18/2014 11/08/13   Billy Fischer, MD  hyoscyamine (LEVSIN SL) 0.125 MG SL tablet PLACE 1 TABLET UNDER THE TONGUE EVERY 4 HOURS AS NEEDED 01/07/14   Jerene Bears, MD  methylPREDNISolone (MEDROL DOSEPAK) 4 MG tablet follow package directions, start on tues, take until finished Patient not taking: Reported on 03/18/2014 11/08/13   Billy Fischer, MD  promethazine (PHENERGAN) 25 MG tablet Take 1 tablet (25 mg total) by mouth every 6 (six) hours as needed for nausea or vomiting. Patient not taking: Reported on 03/18/2014 02/08/13   Hoy Morn, MD  traMADol (  ULTRAM) 50 MG tablet Take 1 tablet (50 mg total) by mouth every 6 (six) hours as needed. 03/18/14   Evelina Bucy, MD   BP 164/73 mmHg  Pulse 70  Temp(Src) 98.1 F (36.7 C) (Oral)  Resp 16  SpO2 100%  LMP 03/14/2014 Physical Exam  Constitutional: She is oriented to person, place, and time. She appears well-developed and well-nourished. No distress.  HENT:  Head: Normocephalic and atraumatic.  Mouth/Throat: Oropharynx is clear and moist.  Eyes: EOM are normal. Pupils are equal, round, and reactive to light.  Neck: Normal range of  motion. Neck supple.  Cardiovascular: Normal rate and regular rhythm.  Exam reveals no friction rub.   No murmur heard. Pulmonary/Chest: Effort normal and breath sounds normal. No respiratory distress. She has no wheezes. She has no rales.  Abdominal: Soft. She exhibits no distension. There is no tenderness. There is no rebound.  Musculoskeletal: Normal range of motion. She exhibits no edema.       Feet:  Neurological: She is alert and oriented to person, place, and time.  Skin: She is not diaphoretic.  Nursing note and vitals reviewed.   ED Course  Procedures (including critical care time) Labs Review Labs Reviewed - No data to display  Imaging Review No results found.   EKG Interpretation None      MDM   Final diagnoses:  Foot pain, bilateral    49 year old female here with bilateral foot pain. History of nodules on her foot that she has seen sports medicine for before. She received cortisone injections. Pain has been back for a few months. No trauma. On exam she has mild nodules on the lateral midfoot on both feet. No signs of infection, no cellulitis, no redness. No prone deformities. She is walking a do not think she is an x-ray. Patient agrees. Given tramadol. Stable for discharge.    Evelina Bucy, MD 03/18/14 1024

## 2014-03-18 NOTE — Discharge Instructions (Signed)

## 2014-08-27 ENCOUNTER — Emergency Department (HOSPITAL_COMMUNITY)
Admission: EM | Admit: 2014-08-27 | Discharge: 2014-08-27 | Disposition: A | Discharge status: Home / Self Care | Payer: No Typology Code available for payment source | Attending: Emergency Medicine | Admitting: Emergency Medicine

## 2014-08-27 DIAGNOSIS — Z9049 Acquired absence of other specified parts of digestive tract: Secondary | ICD-10-CM | POA: Insufficient documentation

## 2014-08-27 DIAGNOSIS — I10 Essential (primary) hypertension: Secondary | ICD-10-CM | POA: Insufficient documentation

## 2014-08-27 DIAGNOSIS — Z8709 Personal history of other diseases of the respiratory system: Secondary | ICD-10-CM | POA: Insufficient documentation

## 2014-08-27 DIAGNOSIS — R109 Unspecified abdominal pain: Secondary | ICD-10-CM | POA: Diagnosis not present

## 2014-08-27 DIAGNOSIS — Z7951 Long term (current) use of inhaled steroids: Secondary | ICD-10-CM | POA: Diagnosis not present

## 2014-08-27 DIAGNOSIS — Z79899 Other long term (current) drug therapy: Secondary | ICD-10-CM | POA: Insufficient documentation

## 2014-08-27 DIAGNOSIS — Z3202 Encounter for pregnancy test, result negative: Secondary | ICD-10-CM | POA: Insufficient documentation

## 2014-08-27 DIAGNOSIS — N898 Other specified noninflammatory disorders of vagina: Secondary | ICD-10-CM | POA: Insufficient documentation

## 2014-08-27 LAB — CBC WITH DIFFERENTIAL/PLATELET
Basophils Absolute: 0 10*3/uL (ref 0.0–0.1)
Basophils Relative: 1 % (ref 0–1)
Eosinophils Absolute: 0.2 10*3/uL (ref 0.0–0.7)
Eosinophils Relative: 3 % (ref 0–5)
HCT: 36.5 % (ref 36.0–46.0)
Hemoglobin: 10.8 g/dL — ABNORMAL LOW (ref 12.0–15.0)
Lymphocytes Relative: 32 % (ref 12–46)
Lymphs Abs: 2.1 10*3/uL (ref 0.7–4.0)
MCH: 23.2 pg — ABNORMAL LOW (ref 26.0–34.0)
MCHC: 29.6 g/dL — ABNORMAL LOW (ref 30.0–36.0)
MCV: 78.5 fL (ref 78.0–100.0)
Monocytes Absolute: 0.3 10*3/uL (ref 0.1–1.0)
Monocytes Relative: 5 % (ref 3–12)
Neutro Abs: 3.9 10*3/uL (ref 1.7–7.7)
Neutrophils Relative %: 59 % (ref 43–77)
Platelets: 277 10*3/uL (ref 150–400)
RBC: 4.65 MIL/uL (ref 3.87–5.11)
RDW: 15.9 % — ABNORMAL HIGH (ref 11.5–15.5)
WBC: 6.5 10*3/uL (ref 4.0–10.5)

## 2014-08-27 LAB — WET PREP, GENITAL
Clue Cells Wet Prep HPF POC: NONE SEEN
Trich, Wet Prep: NONE SEEN
WBC, Wet Prep HPF POC: NONE SEEN
Yeast Wet Prep HPF POC: NONE SEEN

## 2014-08-27 LAB — URINALYSIS, ROUTINE W REFLEX MICROSCOPIC
Bilirubin Urine: NEGATIVE
Glucose, UA: NEGATIVE mg/dL
Hgb urine dipstick: NEGATIVE
Ketones, ur: NEGATIVE mg/dL
Leukocytes, UA: NEGATIVE
Nitrite: NEGATIVE
Protein, ur: NEGATIVE mg/dL
Specific Gravity, Urine: 1.021 (ref 1.005–1.030)
Urobilinogen, UA: 0.2 mg/dL (ref 0.0–1.0)
pH: 5.5 (ref 5.0–8.0)

## 2014-08-27 LAB — COMPREHENSIVE METABOLIC PANEL
ALT: 12 U/L — ABNORMAL LOW (ref 14–54)
AST: 23 U/L (ref 15–41)
Albumin: 3.8 g/dL (ref 3.5–5.0)
Alkaline Phosphatase: 71 U/L (ref 38–126)
Anion gap: 8 (ref 5–15)
BUN: 11 mg/dL (ref 6–20)
CO2: 26 mmol/L (ref 22–32)
Calcium: 9 mg/dL (ref 8.9–10.3)
Chloride: 106 mmol/L (ref 101–111)
Creatinine, Ser: 1.2 mg/dL — ABNORMAL HIGH (ref 0.44–1.00)
GFR calc Af Amer: >60 mL/min (ref >60)
GFR calc non Af Amer: 53 mL/min — ABNORMAL LOW (ref >60)
Glucose, Bld: 136 mg/dL — ABNORMAL HIGH (ref 65–99)
Potassium: 3.8 mmol/L (ref 3.5–5.1)
Sodium: 140 mmol/L (ref 135–145)
Total Bilirubin: 0.9 mg/dL (ref 0.3–1.2)
Total Protein: 7.5 g/dL (ref 6.5–8.1)

## 2014-08-27 LAB — LIPASE, BLOOD: Lipase: <10 U/L — ABNORMAL LOW (ref 22–51)

## 2014-08-27 LAB — POC URINE PREG, ED: Preg Test, Ur: NEGATIVE

## 2014-08-27 MED ORDER — TRAMADOL HCL 50 MG PO TABS
50.0000 mg | ORAL_TABLET | Freq: Four times a day (QID) | ORAL | Status: DC | PRN
Start: 2014-08-27 — End: 2014-12-18

## 2014-08-27 MED ORDER — NAPROXEN 500 MG PO TABS: 500.0000 mg | ORAL_TABLET | Freq: Two times a day (BID) | ORAL | Status: DC

## 2014-08-27 NOTE — ED Notes (Signed)
Pt reports BIL flank pain that radiates to abdomen and into groin. Reports vaginal discharge, denies dysuria but reports pressure. Pain 8/10. Reports nausea, denies vomiting/diarrhea.

## 2014-08-27 NOTE — Discharge Instructions (Signed)
You were evaluated in the ED today for your abdominal discomfort and discharge. There does not appear to be an emergent cause your symptoms at this time. Ear exam, labs were all reassuring today. Please take her pain medicines as directed for your discomfort. You may also alternate between the naproxen and Advil and Tylenol for your discomfort. Please follow-up with her primary care for further management and management of your symptoms. Return to ED for worsening symptoms.  Abdominal Pain, Women Abdominal (stomach, pelvic, or belly) pain can be caused by many things. It is important to tell your doctor:  The location of the pain.  Does it come and go or is it present all the time?  Are there things that start the pain (eating certain foods, exercise)?  Are there other symptoms associated with the pain (fever, nausea, vomiting, diarrhea)? All of this is helpful to know when trying to find the cause of the pain. CAUSES   Stomach: virus or bacteria infection, or ulcer.  Intestine: appendicitis (inflamed appendix), regional ileitis (Crohn's disease), ulcerative colitis (inflamed colon), irritable bowel syndrome, diverticulitis (inflamed diverticulum of the colon), or cancer of the stomach or intestine.  Gallbladder disease or stones in the gallbladder.  Kidney disease, kidney stones, or infection.  Pancreas infection or cancer.  Fibromyalgia (pain disorder).  Diseases of the female organs:  Uterus: fibroid (non-cancerous) tumors or infection.  Fallopian tubes: infection or tubal pregnancy.  Ovary: cysts or tumors.  Pelvic adhesions (scar tissue).  Endometriosis (uterus lining tissue growing in the pelvis and on the pelvic organs).  Pelvic congestion syndrome (female organs filling up with blood just before the menstrual period).  Pain with the menstrual period.  Pain with ovulation (producing an egg).  Pain with an IUD (intrauterine device, birth control) in the  uterus.  Cancer of the female organs.  Functional pain (pain not caused by a disease, may improve without treatment).  Psychological pain.  Depression. DIAGNOSIS  Your doctor will decide the seriousness of your pain by doing an examination.  Blood tests.  X-rays.  Ultrasound.  CT scan (computed tomography, special type of X-ray).  MRI (magnetic resonance imaging).  Cultures, for infection.  Barium enema (dye inserted in the large intestine, to better view it with X-rays).  Colonoscopy (looking in intestine with a lighted tube).  Laparoscopy (minor surgery, looking in abdomen with a lighted tube).  Major abdominal exploratory surgery (looking in abdomen with a large incision). TREATMENT  The treatment will depend on the cause of the pain.   Many cases can be observed and treated at home.  Over-the-counter medicines recommended by your caregiver.  Prescription medicine.  Antibiotics, for infection.  Birth control pills, for painful periods or for ovulation pain.  Hormone treatment, for endometriosis.  Nerve blocking injections.  Physical therapy.  Antidepressants.  Counseling with a psychologist or psychiatrist.  Minor or major surgery. HOME CARE INSTRUCTIONS   Do not take laxatives, unless directed by your caregiver.  Take over-the-counter pain medicine only if ordered by your caregiver. Do not take aspirin because it can cause an upset stomach or bleeding.  Try a clear liquid diet (broth or water) as ordered by your caregiver. Slowly move to a bland diet, as tolerated, if the pain is related to the stomach or intestine.  Have a thermometer and take your temperature several times a day, and record it.  Bed rest and sleep, if it helps the pain.  Avoid sexual intercourse, if it causes pain.  Avoid stressful  situations.  Keep your follow-up appointments and tests, as your caregiver orders.  If the pain does not go away with medicine or surgery, you  may try:  Acupuncture.  Relaxation exercises (yoga, meditation).  Group therapy.  Counseling. SEEK MEDICAL CARE IF:   You notice certain foods cause stomach pain.  Your home care treatment is not helping your pain.  You need stronger pain medicine.  You want your IUD removed.  You feel faint or lightheaded.  You develop nausea and vomiting.  You develop a rash.  You are having side effects or an allergy to your medicine. SEEK IMMEDIATE MEDICAL CARE IF:   Your pain does not go away or gets worse.  You have a fever.  Your pain is felt only in portions of the abdomen. The right side could possibly be appendicitis. The left lower portion of the abdomen could be colitis or diverticulitis.  You are passing blood in your stools (bright red or black tarry stools, with or without vomiting).  You have blood in your urine.  You develop chills, with or without a fever.  You pass out. MAKE SURE YOU:   Understand these instructions.  Will watch your condition.  Will get help right away if you are not doing well or get worse. Document Released: 12/02/2006 Document Revised: 06/21/2013 Document Reviewed: 12/22/2008 [facility] Patient Information 2015 [place], [place]. This information is not intended to replace advice given to you by your health care provider. Make sure you discuss any questions you have with your health care provider.

## 2014-08-27 NOTE — ED Provider Notes (Signed)
CSN: [id number]     Arrival date & time 08/27/14  1314 History   First MD Initiated Contact with Patient 08/27/14 1347     Chief Complaint  Patient presents with  . Flank Pain  . Vaginal Discharge     (Consider location/radiation/quality/duration/timing/severity/associated sxs/prior Treatment) HPI Gabriela Lowe is a 49 y.o. female with history of hypertension comes in for bilateral flank pain and vaginal discharge. Patient states she has had flank pain since Tuesday, but it worsened on Thursday. She reports a sharp stabbing, waxing and waning discomfort that starts in her back and radiates to her groin. Rated as 8/10. She denies any urinary symptoms, fevers, vomiting, abdominal pain. She does endorse nausea. She also reports associated vaginal discharge that is unusual for her. She denies any vaginal bleeding. Last menstrual period ended on June 28 and was normal for her.  Past Medical History  Diagnosis Date  . Hypertension   . Bronchitis    Past Surgical History  Procedure Laterality Date  . Cesarean section      x 2  . Cholecystectomy     Family History  Problem Relation Age of Onset  . Hypertension Mother   . Diabetes Mother   . Diabetes Father   . Diabetes Brother    History  Substance Use Topics  . Smoking status: Never Smoker   . Smokeless tobacco: Never Used  . Alcohol Use: No   OB History    No data available     Review of Systems A 10 point review of systems was completed and was negative except for pertinent positives and negatives as mentioned in the history of present illness    Allergies  Review of patient's allergies indicates no known allergies.  Home Medications   Prior to Admission medications   Medication Sig Start Date End Date Taking? Authorizing Provider  acetaminophen (TYLENOL) 500 MG tablet Take 1,000 mg by mouth every 6 (six) hours as needed (for pain.).   Yes Historical Provider, MD  diclofenac sodium (VOLTAREN) 1 % GEL Apply 2 g  topically 4 (four) times daily as needed (For pain.).   Yes Historical Provider, MD  fluticasone (FLONASE) 50 MCG/ACT nasal spray Place 2 sprays into both nostrils daily. Patient taking differently: Place 2 sprays into both nostrils daily as needed for allergies.  01/10/13  Yes Harden Mo, MD  hyoscyamine (LEVSIN SL) 0.125 MG SL tablet PLACE 1 TABLET UNDER THE TONGUE EVERY 4 HOURS AS NEEDED Patient taking differently: PLACE 1 TABLET UNDER THE TONGUE EVERY 4 HOURS AS NEEDED FOR CRAMPING 01/07/14  Yes Jerene Bears, MD  lidocaine (LIDODERM) 5 % Place 1 patch onto the skin daily as needed (Applies to feet for pain.). Remove & Discard patch within 12 hours or as directed by MD   Yes Historical Provider, MD  lisinopril-hydrochlorothiazide (PRINZIDE,ZESTORETIC) 20-25 MG per tablet Take 1 tablet by mouth daily.   Yes Historical Provider, MD  Multiple Vitamin (MULTIVITAMIN WITH MINERALS) TABS tablet Take 1 tablet by mouth daily.   Yes Historical Provider, MD  cyclobenzaprine (FLEXERIL) 5 MG tablet Take 1 tablet (5 mg total) by mouth 3 (three) times daily as needed for muscle spasms. Patient not taking: Reported on 03/18/2014 11/08/13   Billy Fischer, MD  methylPREDNISolone (MEDROL DOSEPAK) 4 MG tablet follow package directions, start on tues, take until finished Patient not taking: Reported on 03/18/2014 11/08/13   Billy Fischer, MD  naproxen (NAPROSYN) 500 MG tablet Take 1 tablet (500 mg  total) by mouth 2 (two) times daily. 08/27/14   Comer Locket, PA-C  promethazine (PHENERGAN) 25 MG tablet Take 1 tablet (25 mg total) by mouth every 6 (six) hours as needed for nausea or vomiting. Patient not taking: Reported on 03/18/2014 02/08/13   Jola Schmidt, MD  traMADol (ULTRAM) 50 MG tablet Take 1 tablet (50 mg total) by mouth every 6 (six) hours as needed. 08/27/14   Nafis Farnan, PA-C   BP 194/85 mmHg  Pulse 70  Temp(Src) 98.7 F (37.1 C) (Oral)  Resp 18  Ht 5\' 6"  (1.676 m)  Wt 214 lb (97.07 kg)  BMI  34.56 kg/m2  SpO2 100%  LMP 08/16/2014 (Exact Date) Physical Exam  Constitutional: She is oriented to person, place, and time. She appears well-developed and well-nourished.  HENT:  Head: Normocephalic and atraumatic.  Mouth/Throat: Oropharynx is clear and moist.  Eyes: Conjunctivae are normal. Pupils are equal, round, and reactive to light. Right eye exhibits no discharge. Left eye exhibits no discharge. No scleral icterus.  Neck: Neck supple.  Cardiovascular: Normal rate, regular rhythm and normal heart sounds.   Pulmonary/Chest: Effort normal and breath sounds normal. No respiratory distress. She has no wheezes. She has no rales.  Abdominal: Soft.  Abdomen soft, nondistended. No tenderness. No CVA tenderness. No rebound or guarding. No lesions or deformities.  Genitourinary:  Chaperone was present for the entire genital exam. No lesions or rashes appreciated on vulva. Cervix visualized on speculum exam and appropriate cultures sampled. Scant blood in vaginal vault. Discharge: Copious white discharge. Upon bi manual exam- No TTP of the adnexa, no cervical motion tenderness. No fullness or masses appreciated. No abnormalities appreciated in structural anatomy.   Musculoskeletal: She exhibits no tenderness.  Neurological: She is alert and oriented to person, place, and time.  Cranial Nerves II-XII grossly intact  Skin: Skin is warm and dry. No rash noted.  Psychiatric: She has a normal mood and affect.  Nursing note and vitals reviewed.   ED Course  Procedures (including critical care time) Labs Review Labs Reviewed  CBC WITH DIFFERENTIAL/PLATELET - Abnormal; Notable for the following:    Hemoglobin 10.8 (*)    MCH 23.2 (*)    MCHC 29.6 (*)    RDW 15.9 (*)    All other components within normal limits  COMPREHENSIVE METABOLIC PANEL - Abnormal; Notable for the following:    Glucose, Bld 136 (*)    Creatinine, Ser 1.20 (*)    ALT 12 (*)    GFR calc non Af Amer 53 (*)    All  other components within normal limits  LIPASE, BLOOD - Abnormal; Notable for the following:    Lipase <10 (*)    All other components within normal limits  WET PREP, GENITAL  URINALYSIS, ROUTINE W REFLEX MICROSCOPIC (NOT AT [facility])  HIV ANTIBODY (ROUTINE TESTING)  POC URINE PREG, ED  GC/CHLAMYDIA PROBE AMP ([place]) NOT AT [facility]    Imaging Review No results found.   EKG Interpretation None     Meds given in ED:  Medications - No data to display  New Prescriptions   NAPROXEN (NAPROSYN) 500 MG TABLET    Take 1 tablet (500 mg total) by mouth 2 (two) times daily.   TRAMADOL (ULTRAM) 50 MG TABLET    Take 1 tablet (50 mg total) by mouth every 6 (six) hours as needed.   Filed Vitals:   08/27/14 1326  BP: 194/85  Pulse: 70  Temp: 98.7 F (37.1 C)  TempSrc: Oral  Resp: 18  Height: 5\' 6"  (1.676 m)  Weight: 214 lb (97.07 kg)  SpO2: 100%    MDM  Vitals stable - WNL -afebrile Pt resting comfortably in ED. states she feels fine. Denies any nausea in ED. PE--abdominal exam not concerning. Physical exam is otherwise grossly benign. Labwork--labs Baseline and noncontributory.  No evidence of acute intra-abdominal pathology or other emergency. No evidence of active stone, UTI, PID. Will DC with short course pain medicine, alternate anti-inflammatory's and Tylenol. I discussed all relevant lab findings and imaging results with pt and they verbalized understanding. Discussed f/u with PCP within 48 hrs and return precautions, pt very amenable to plan.  Final diagnoses:  Bilateral flank pain    Comer Locket, PA-C 08/27/14 [place], MD 08/29/14 [phone]

## 2014-08-28 LAB — HIV ANTIBODY (ROUTINE TESTING W REFLEX): HIV Screen 4th Generation wRfx: NONREACTIVE

## 2014-08-29 LAB — GC/CHLAMYDIA PROBE AMP (~~LOC~~) NOT AT ARMC
Chlamydia: NEGATIVE
Neisseria Gonorrhea: NEGATIVE

## 2014-12-18 ENCOUNTER — Emergency Department (HOSPITAL_COMMUNITY)
Admission: EM | Admit: 2014-12-18 | Discharge: 2014-12-18 | Disposition: A | Discharge status: Home / Self Care | Payer: BLUE CROSS/BLUE SHIELD | Attending: Emergency Medicine | Admitting: Emergency Medicine

## 2014-12-18 ENCOUNTER — Encounter (HOSPITAL_COMMUNITY): Payer: Self-pay

## 2014-12-18 DIAGNOSIS — Z7951 Long term (current) use of inhaled steroids: Secondary | ICD-10-CM | POA: Diagnosis not present

## 2014-12-18 DIAGNOSIS — Z79899 Other long term (current) drug therapy: Secondary | ICD-10-CM | POA: Diagnosis not present

## 2014-12-18 DIAGNOSIS — R51 Headache: Secondary | ICD-10-CM | POA: Insufficient documentation

## 2014-12-18 DIAGNOSIS — I10 Essential (primary) hypertension: Secondary | ICD-10-CM | POA: Insufficient documentation

## 2014-12-18 DIAGNOSIS — R519 Headache, unspecified: Secondary | ICD-10-CM

## 2014-12-18 DIAGNOSIS — Z8709 Personal history of other diseases of the respiratory system: Secondary | ICD-10-CM | POA: Insufficient documentation

## 2014-12-18 DIAGNOSIS — Z791 Long term (current) use of non-steroidal anti-inflammatories (NSAID): Secondary | ICD-10-CM | POA: Insufficient documentation

## 2014-12-18 DIAGNOSIS — R42 Dizziness and giddiness: Secondary | ICD-10-CM | POA: Diagnosis present

## 2014-12-18 LAB — I-STAT CHEM 8, ED
BUN: 16 mg/dL (ref 6–20)
Calcium, Ion: 1.17 mmol/L (ref 1.12–1.23)
Chloride: 108 mmol/L (ref 101–111)
Creatinine, Ser: 1.2 mg/dL — ABNORMAL HIGH (ref 0.44–1.00)
Glucose, Bld: 78 mg/dL (ref 65–99)
HCT: 39 % (ref 36.0–46.0)
Hemoglobin: 13.3 g/dL (ref 12.0–15.0)
Potassium: 4 mmol/L (ref 3.5–5.1)
Sodium: 143 mmol/L (ref 135–145)
TCO2: 25 mmol/L (ref 0–100)

## 2014-12-18 MED ORDER — LISINOPRIL-HYDROCHLOROTHIAZIDE 10-12.5 MG PO TABS: 1.0000 | ORAL_TABLET | Freq: Every day | ORAL | Status: DC

## 2014-12-18 MED ORDER — METOCLOPRAMIDE HCL 5 MG/ML IJ SOLN
10.0000 mg | Freq: Once | INTRAMUSCULAR | Status: AC
  Administered 2014-12-18: 10 mg via INTRAVENOUS
  Filled 2014-12-18: qty 2

## 2014-12-18 MED ORDER — DIPHENHYDRAMINE HCL 50 MG/ML IJ SOLN: 25.0000 mg | Freq: Once | INTRAMUSCULAR | Status: DC

## 2014-12-18 MED ORDER — SODIUM CHLORIDE 0.9 % IV BOLUS (SEPSIS)
1000.0000 mL | Freq: Once | INTRAVENOUS | Status: AC
  Administered 2014-12-18: 1000 mL via INTRAVENOUS

## 2014-12-18 NOTE — ED Notes (Signed)
Patient states she has been unable to get her BP meds filled x 1 week because person she was refrred to is no longer practicing. Patient is now exeriencing headache and dizziness.

## 2014-12-18 NOTE — ED Notes (Signed)
Pt reported elevated BP 183/74 at home and having dizziness/lightheadedness and headaches. Denies LOC or visual disturbances. NIH scale 0. Family at bedside.

## 2014-12-18 NOTE — ED Provider Notes (Signed)
CSN: [id number]     Arrival date & time 12/18/14  1454 History   First MD Initiated Contact with Patient 12/18/14 1729     Chief Complaint  Patient presents with  . Dizziness  . Headache      The history is provided by the patient. No language interpreter was used.   Ms. Yearwood is a 49 year old woman with history of hypertension here for violation of medication refill, headache. She ran out of her lisinopril/HCTZ combo one week ago. She notes 3 days of generalized headache, left ear pain. Headache is waxing and waning but gradually worsening for the last 2 days. She denies any numbness, weakness. She does have associated nausea, no vomiting. She denies any chest pain or shortness of breath. She checked her blood pressure at home and it was elevated with a systolic pressure of 073. She has follow-up scheduled for November 17. Symptoms are moderate, waxing and waning, worsening.  Past Medical History  Diagnosis Date  . Hypertension   . Bronchitis    Past Surgical History  Procedure Laterality Date  . Cesarean section      x 2  . Cholecystectomy     Family History  Problem Relation Age of Onset  . Hypertension Mother   . Diabetes Mother   . Diabetes Father   . Diabetes Brother    Social History  Substance Use Topics  . Smoking status: Never Smoker   . Smokeless tobacco: Never Used  . Alcohol Use: No   OB History    No data available     Review of Systems  All other systems reviewed and are negative.     Allergies  Review of patient's allergies indicates no known allergies.  Home Medications   Prior to Admission medications   Medication Sig Start Date End Date Taking? Authorizing Provider  acetaminophen (TYLENOL) 500 MG tablet Take 1,000 mg by mouth every 6 (six) hours as needed (for pain.).    Historical Provider, MD  cyclobenzaprine (FLEXERIL) 5 MG tablet Take 1 tablet (5 mg total) by mouth 3 (three) times daily as needed for muscle spasms. Patient not taking:  Reported on 03/18/2014 11/08/13   Billy Fischer, MD  diclofenac sodium (VOLTAREN) 1 % GEL Apply 2 g topically 4 (four) times daily as needed (For pain.).    Historical Provider, MD  fluticasone (FLONASE) 50 MCG/ACT nasal spray Place 2 sprays into both nostrils daily. Patient taking differently: Place 2 sprays into both nostrils daily as needed for allergies.  01/10/13   Harden Mo, MD  hyoscyamine (LEVSIN SL) 0.125 MG SL tablet PLACE 1 TABLET UNDER THE TONGUE EVERY 4 HOURS AS NEEDED Patient taking differently: PLACE 1 TABLET UNDER THE TONGUE EVERY 4 HOURS AS NEEDED FOR CRAMPING 01/07/14   Jerene Bears, MD  lidocaine (LIDODERM) 5 % Place 1 patch onto the skin daily as needed (Applies to feet for pain.). Remove & Discard patch within 12 hours or as directed by MD    Historical Provider, MD  lisinopril-hydrochlorothiazide (PRINZIDE,ZESTORETIC) 20-25 MG per tablet Take 1 tablet by mouth daily.    Historical Provider, MD  methylPREDNISolone (MEDROL DOSEPAK) 4 MG tablet follow package directions, start on tues, take until finished Patient not taking: Reported on 03/18/2014 11/08/13   Billy Fischer, MD  Multiple Vitamin (MULTIVITAMIN WITH MINERALS) TABS tablet Take 1 tablet by mouth daily.    Historical Provider, MD  naproxen (NAPROSYN) 500 MG tablet Take 1 tablet (500 mg total) by mouth  2 (two) times daily. 08/27/14   Comer Locket, PA-C  promethazine (PHENERGAN) 25 MG tablet Take 1 tablet (25 mg total) by mouth every 6 (six) hours as needed for nausea or vomiting. Patient not taking: Reported on 03/18/2014 02/08/13   Jola Schmidt, MD  traMADol (ULTRAM) 50 MG tablet Take 1 tablet (50 mg total) by mouth every 6 (six) hours as needed. 08/27/14   Benjamin Cartner, PA-C   BP 160/74 mmHg  Pulse 72  Temp(Src) 98.2 F (36.8 C) (Oral)  Resp 16  SpO2 100%  LMP 11/30/2014 Physical Exam  Constitutional: She is oriented to person, place, and time. She appears well-developed and well-nourished.  HENT:  Head:  Normocephalic and atraumatic.  Right Ear: External ear normal.  Left TM obscured by cerumen  Eyes: EOM are normal. Pupils are equal, round, and reactive to light.  Neck: Neck supple.  Cardiovascular: Normal rate and regular rhythm.   No murmur heard. Pulmonary/Chest: Effort normal and breath sounds normal. No respiratory distress.  Abdominal: Soft. There is no tenderness. There is no rebound and no guarding.  Musculoskeletal: She exhibits no edema or tenderness.  Neurological: She is alert and oriented to person, place, and time. No cranial nerve deficit.  5 out of 5 strength in all 4 extremities  Skin: Skin is warm and dry.  Psychiatric: She has a normal mood and affect. Her behavior is normal.  Nursing note and vitals reviewed.   ED Course  Procedures (including critical care time) Labs Review Labs Reviewed  I-STAT CHEM 8, ED - Abnormal; Notable for the following:    Creatinine, Ser 1.20 (*)    All other components within normal limits    Imaging Review No results found. I have personally reviewed and evaluated these images and lab results as part of my medical decision-making.   EKG Interpretation None      MDM   Final diagnoses:  Bad headache    Patient here for evaluation of headache, out of her blood pressure medications. She is nontoxic appearing on examination with no focal neurologic deficits. Presentation is not consistent with hypertensive urgency. Creatinine was stable renal insufficiency. Will restart her lisinopril/HCTZ at half her prior dose because her blood pressure is not that elevated in the emergency department. Discussed home care and return precautions.    Quintella Reichert, MD 12/19/14 [phone]

## 2014-12-18 NOTE — Discharge Instructions (Signed)
General Headache Without Cause °A headache is pain or discomfort felt around the head or neck area. The specific cause of a headache may not be found. There are many causes and types of headaches. A few common ones are: °· Tension headaches. °· Migraine headaches. °· Cluster headaches. °· Chronic daily headaches. °HOME CARE INSTRUCTIONS  °Watch your condition for any changes. Take these steps to help with your condition: °Managing Pain °· Take over-the-counter and prescription medicines only as told by your health care provider. °· Lie down in a dark, quiet room when you have a headache. °· If directed, apply ice to the head and neck area: °· Put ice in a plastic bag. °· Place a towel between your skin and the bag. °· Leave the ice on for 20 minutes, 2-3 times per day. °· Use a heating pad or hot shower to apply heat to the head and neck area as told by your health care provider. °· Keep lights dim if bright lights bother you or make your headaches worse. °Eating and Drinking °· Eat meals on a regular schedule. °· Limit alcohol use. °· Decrease the amount of caffeine you drink, or stop drinking caffeine. °General Instructions °· Keep all follow-up visits as told by your health care provider. This is important. °· Keep a headache journal to help find out what may trigger your headaches. For example, write down: °· What you eat and drink. °· How much sleep you get. °· Any change to your diet or medicines. °· Try massage or other relaxation techniques. °· Limit stress. °· Sit up straight, and do not tense your muscles. °· Do not use tobacco products, including cigarettes, chewing tobacco, or e-cigarettes. If you need help quitting, ask your health care provider. °· Exercise regularly as told by your health care provider. °· Sleep on a regular schedule. Get 7-9 hours of sleep, or the amount recommended by your health care provider. °SEEK MEDICAL CARE IF:  °· Your symptoms are not helped by medicine. °· You have a  headache that is different from the usual headache. °· You have nausea or you vomit. °· You have a fever. °SEEK IMMEDIATE MEDICAL CARE IF:  °· Your headache becomes severe. °· You have repeated vomiting. °· You have a stiff neck. °· You have a loss of vision. °· You have problems with speech. °· You have pain in the eye or ear. °· You have muscular weakness or loss of muscle control. °· You lose your balance or have trouble walking. °· You feel faint or pass out. °· You have confusion. °  °This information is not intended to replace advice given to you by your health care provider. Make sure you discuss any questions you have with your health care provider. °  °Document Released: 02/04/2005 Document Revised: 10/26/2014 Document Reviewed: 05/30/2014 °Elsevier Interactive Patient Education ©2016 [employer] ° °Hypertension °Hypertension, commonly called high blood pressure, is when the force of blood pumping through your arteries is too strong. Your arteries are the blood vessels that carry blood from your heart throughout your body. A blood pressure reading consists of a higher number over a lower number, such as 110/72. The higher number (systolic) is the pressure inside your arteries when your heart pumps. The lower number (diastolic) is the pressure inside your arteries when your heart relaxes. Ideally you want your blood pressure below 120/80. °Hypertension forces your heart to work harder to pump blood. Your arteries may become narrow or stiff. Having untreated or   uncontrolled hypertension can cause heart attack, stroke, kidney disease, and other problems. °RISK FACTORS °Some risk factors for high blood pressure are controllable. Others are not.  °Risk factors you cannot control include:  °· Race. You may be at higher risk if you are African American. °· Age. Risk increases with age. °· Gender. Men are at higher risk than women before age 45 years. After age 65, women are at higher risk than men. °Risk factors  you can control include: °· Not getting enough exercise or physical activity. °· Being overweight. °· Getting too much fat, sugar, calories, or salt in your diet. °· Drinking too much alcohol. °SIGNS AND SYMPTOMS °Hypertension does not usually cause signs or symptoms. Extremely high blood pressure (hypertensive crisis) may cause headache, anxiety, shortness of breath, and nosebleed. °DIAGNOSIS °To check if you have hypertension, your health care provider will measure your blood pressure while you are seated, with your arm held at the level of your heart. It should be measured at least twice using the same arm. Certain conditions can cause a difference in blood pressure between your right and left arms. A blood pressure reading that is higher than normal on one occasion does not mean that you need treatment. If it is not clear whether you have high blood pressure, you may be asked to return on a different day to have your blood pressure checked again. Or, you may be asked to monitor your blood pressure at home for 1 or more weeks. °TREATMENT °Treating high blood pressure includes making lifestyle changes and possibly taking medicine. Living a healthy lifestyle can help lower high blood pressure. You may need to change some of your habits. °Lifestyle changes may include: °· Following the DASH diet. This diet is high in fruits, vegetables, and whole grains. It is low in salt, red meat, and added sugars. °· Keep your sodium intake below 2,300 mg per day. °· Getting at least 30-45 minutes of aerobic exercise at least 4 times per week. °· Losing weight if necessary. °· Not smoking. °· Limiting alcoholic beverages. °· Learning ways to reduce stress. °Your health care provider may prescribe medicine if lifestyle changes are not enough to get your blood pressure under control, and if one of the following is true: °· You are 18-59 years of age and your systolic blood pressure is above 140. °· You are 60 years of age or older,  and your systolic blood pressure is above 150. °· Your diastolic blood pressure is above 90. °· You have diabetes, and your systolic blood pressure is over 140 or your diastolic blood pressure is over 90. °· You have kidney disease and your blood pressure is above 140/90. °· You have heart disease and your blood pressure is above 140/90. °Your personal target blood pressure may vary depending on your medical conditions, your age, and other factors. °HOME CARE INSTRUCTIONS °· Have your blood pressure rechecked as directed by your health care provider.   °· Take medicines only as directed by your health care provider. Follow the directions carefully. Blood pressure medicines must be taken as prescribed. The medicine does not work as well when you skip doses. Skipping doses also puts you at risk for problems. °· Do not smoke.   °· Monitor your blood pressure at home as directed by your health care provider.  °SEEK MEDICAL CARE IF:  °· You think you are having a reaction to medicines taken. °· You have recurrent headaches or feel dizzy. °· You have swelling in your   ankles. °· You have trouble with your vision. °SEEK IMMEDIATE MEDICAL CARE IF: °· You develop a severe headache or confusion. °· You have unusual weakness, numbness, or feel faint. °· You have severe chest or abdominal pain. °· You vomit repeatedly. °· You have trouble breathing. °MAKE SURE YOU:  °· Understand these instructions. °· Will watch your condition. °· Will get help right away if you are not doing well or get worse. °  °This information is not intended to replace advice given to you by your health care provider. Make sure you discuss any questions you have with your health care provider. °  °Document Released: 02/04/2005 Document Revised: 06/21/2014 Document Reviewed: 11/27/2012 °Elsevier Interactive Patient Education ©2016 [employer] ° °

## 2015-01-10 ENCOUNTER — Other Ambulatory Visit: Payer: Self-pay | Admitting: Internal Medicine

## 2015-02-19 HISTORY — PX: BREAST EXCISIONAL BIOPSY: SUR124

## 2015-03-01 ENCOUNTER — Encounter: Payer: Self-pay | Admitting: *Deleted

## 2015-03-28 ENCOUNTER — Encounter: Payer: Self-pay | Admitting: Internal Medicine

## 2015-03-28 ENCOUNTER — Other Ambulatory Visit (INDEPENDENT_AMBULATORY_CARE_PROVIDER_SITE_OTHER): Payer: BLUE CROSS/BLUE SHIELD

## 2015-03-28 ENCOUNTER — Ambulatory Visit (INDEPENDENT_AMBULATORY_CARE_PROVIDER_SITE_OTHER): Payer: BLUE CROSS/BLUE SHIELD | Admitting: Internal Medicine

## 2015-03-28 VITALS — BP 136/70 | HR 80 | Ht 65.0 in | Wt 223.2 lb

## 2015-03-28 DIAGNOSIS — R12 Heartburn: Secondary | ICD-10-CM | POA: Diagnosis not present

## 2015-03-28 DIAGNOSIS — R112 Nausea with vomiting, unspecified: Secondary | ICD-10-CM

## 2015-03-28 DIAGNOSIS — R109 Unspecified abdominal pain: Secondary | ICD-10-CM | POA: Diagnosis not present

## 2015-03-28 DIAGNOSIS — R1013 Epigastric pain: Secondary | ICD-10-CM | POA: Diagnosis not present

## 2015-03-28 LAB — COMPREHENSIVE METABOLIC PANEL
ALT: 11 U/L (ref 0–35)
AST: 17 U/L (ref 0–37)
Albumin: 4 g/dL (ref 3.5–5.2)
Alkaline Phosphatase: 78 U/L (ref 39–117)
BUN: 13 mg/dL (ref 6–23)
CO2: 33 mEq/L — ABNORMAL HIGH (ref 19–32)
Calcium: 9.4 mg/dL (ref 8.4–10.5)
Chloride: 106 mEq/L (ref 96–112)
Creatinine, Ser: 1.21 mg/dL — ABNORMAL HIGH (ref 0.40–1.20)
GFR: 60.74 mL/min (ref >60.00)
Glucose, Bld: 102 mg/dL — ABNORMAL HIGH (ref 70–99)
Potassium: 4.4 mEq/L (ref 3.5–5.1)
Sodium: 142 mEq/L (ref 135–145)
Total Bilirubin: 0.4 mg/dL (ref 0.2–1.2)
Total Protein: 7.7 g/dL (ref 6.0–8.3)

## 2015-03-28 LAB — CBC WITH DIFFERENTIAL/PLATELET
Basophils Absolute: 0.1 10*3/uL (ref 0.0–0.1)
Basophils Relative: 0.7 % (ref 0.0–3.0)
Eosinophils Absolute: 0.2 10*3/uL (ref 0.0–0.7)
Eosinophils Relative: 3.4 % (ref 0.0–5.0)
HCT: 36.2 % (ref 36.0–46.0)
Hemoglobin: 11.1 g/dL — ABNORMAL LOW (ref 12.0–15.0)
Lymphocytes Relative: 37.3 % (ref 12.0–46.0)
Lymphs Abs: 2.7 10*3/uL (ref 0.7–4.0)
MCHC: 30.7 g/dL (ref 30.0–36.0)
MCV: 78.4 fl (ref 78.0–100.0)
Monocytes Absolute: 0.4 10*3/uL (ref 0.1–1.0)
Monocytes Relative: 5.4 % (ref 3.0–12.0)
Neutro Abs: 3.8 10*3/uL (ref 1.4–7.7)
Neutrophils Relative %: 53.2 % (ref 43.0–77.0)
Platelets: 273 10*3/uL (ref 150.0–400.0)
RBC: 4.62 Mil/uL (ref 3.87–5.11)
RDW: 15.8 % — ABNORMAL HIGH (ref 11.5–15.5)
WBC: 7.1 10*3/uL (ref 4.0–10.5)

## 2015-03-28 LAB — LIPASE: Lipase: 33 U/L (ref 11.0–59.0)

## 2015-03-28 LAB — HIGH SENSITIVITY CRP: CRP, High Sensitivity: 8.31 mg/L — ABNORMAL HIGH (ref 0.000–5.000)

## 2015-03-28 MED ORDER — ONDANSETRON 4 MG PO TBDP: ORAL_TABLET | ORAL | Status: DC

## 2015-03-28 MED ORDER — PANTOPRAZOLE SODIUM 40 MG PO TBEC: 40.0000 mg | DELAYED_RELEASE_TABLET | Freq: Two times a day (BID) | ORAL | Status: DC

## 2015-03-28 MED ORDER — HYOSCYAMINE SULFATE 0.125 MG SL SUBL: SUBLINGUAL_TABLET | SUBLINGUAL | Status: DC

## 2015-03-28 NOTE — Progress Notes (Signed)
Subjective:    Patient ID: Gabriela Lowe, female    DOB: 1965-09-02, 50 y.o.   MRN: [id number]  HPI Gabriela Lowe is a 50 year old female with a past medical history of low iron anemia, hypertension and history of intermittent abdominal pain who is here for follow-up. She was initially seen in 2015 to evaluate upper abdominal pain and CT scan which suggested enteritis and evaluation for possible Crohn's disease was pursued. She had an upper endoscopy and colonoscopy performed in January 2015 which was largely unrevealing. She did have a nonobstructing Schatzki's ring and a 2 cm hiatal hernia. The colon was normal to the terminal ileum. Follow-up capsule endoscopy was also normal. By the time she followed up last in May 2015 symptoms had resolved and she was feeling well. Over the last 4-6 weeks she's again developed epigastric abdominal pain. This is cramping in nature. Is associated with nausea and vomiting. She's been having vomiting is much as 2-3 times per day. Is been difficult to eat. She has frequent heartburn and regurgitation. She tried over-the-counter Nexium 20 mg which helps some. She's tried to avoid high-fiber and heavy foods. Bowel moves a been unaffected without diarrhea or constipation. No blood in her stool or melena. She did have a prescription for Levsin but it has run out. No hematemesis. Symptoms do feel similar to prior attacks of upper abdominal pain. She has lost about 7 pounds.  Review of Systems As per history of present illness, otherwise negative  Current Medications, Allergies, Past Medical History, Past Surgical History, Family History and Social History were reviewed in [employer] record.     Objective:   Physical Exam BP 136/70 mmHg  Pulse 80  Ht 5\' 5"  (1.651 m)  Wt 223 lb 4 oz (101.266 kg)  BMI 37.15 kg/m2  LMP 02/26/2015 Constitutional: Well-developed and well-nourished. No distress. HEENT: Normocephalic and atraumatic. Oropharynx  is clear and moist. No oropharyngeal exudate. Conjunctivae are normal.  No scleral icterus. Neck: Neck supple. Trachea midline. Cardiovascular: Normal rate, regular rhythm and intact distal pulses. No M/R/G Pulmonary/chest: Effort normal and breath sounds normal. No wheezing, rales or rhonchi. Abdominal: Soft, epigastric and upper abdominal pain without rebound or guarding. nondistended. Bowel sounds active throughout. There are no masses palpable. No hepatosplenomegaly. Extremities: no clubbing, cyanosis, or edema Lymphadenopathy: No cervical adenopathy noted. Neurological: Alert and oriented to person place and time. Skin: Skin is warm and dry. No rashes noted. Psychiatric: Normal mood and affect. Behavior is normal.  Prior CT abd reviewed EGD, colon, VCE reviewed    Assessment & Plan:  50 year old female with a past medical history of low iron anemia, hypertension and history of intermittent abdominal pain who is here for follow-up.  1. Epigastric pain/nausea/vomiting/heartburn -- some of her symptoms seem acid peptic with heartburn and dyspepsia. I cannot exclude recurrent enteritis as suggested by prior CT scan though upper endoscopy, video capsule endoscopy and colonoscopy were unrevealing for IBD in early 2015. After discussion and going to try high-dose acid suppression therapy to see if this improves symptoms prior to repeat cross-sectional imaging. Pantoprazole 40 mg twice a day before meals. Levsin 0.125 mg 1-2 tablets every 4-6 hours as needed. Zofran 4 mg every 6-8 hours as needed for nausea. Check CBC, CMP, lipase, H. pylori stool antigen and CRP today. Follow-up in 2-3 months, sooner if not improving. If not improving recommend cross-sectional imaging.  25 minutes spent with the patient today. Greater than 50% was spent in counseling and  coordination of care with the patient

## 2015-03-28 NOTE — Patient Instructions (Signed)
We have sent the following medications to your pharmacy for you to pick up at your convenience: Pantoprazole 40 mg twice daily before meals Levsin 1-2 tablets every 4-6 hours as needed Zofran 4 mg, 1 tablet every 6-8 hours as needed  Your physician has requested that you go to the basement for the following lab work before leaving today: CBC, CMP, Lipase, CRP, H Pylori stool  Please follow up with Dr Hilarie Fredrickson in 3 months.  Call our office if no improvement in your symptoms over the next 2-4 weeks. If symptoms are worsening, please notify us immediately.

## 2015-03-29 ENCOUNTER — Other Ambulatory Visit: Payer: BLUE CROSS/BLUE SHIELD

## 2015-03-29 DIAGNOSIS — R112 Nausea with vomiting, unspecified: Secondary | ICD-10-CM

## 2015-03-29 DIAGNOSIS — R1013 Epigastric pain: Secondary | ICD-10-CM

## 2015-03-30 LAB — HELICOBACTER PYLORI  SPECIAL ANTIGEN: H. PYLORI Antigen: NOT DETECTED

## 2015-04-10 ENCOUNTER — Telehealth: Payer: Self-pay | Admitting: Internal Medicine

## 2015-04-11 NOTE — Telephone Encounter (Signed)
The office note mentions cross sectional imaging. Do you want to go forward with this?

## 2015-04-11 NOTE — Telephone Encounter (Signed)
Patient is calling again regarding phone note from 04/10/2015   patient states that protonix is not helping her. she states that she is still having problems with GERD. ok to leave a detailed message.   By Irven Baltimore

## 2015-04-13 ENCOUNTER — Other Ambulatory Visit: Payer: Self-pay

## 2015-04-13 DIAGNOSIS — R1013 Epigastric pain: Secondary | ICD-10-CM

## 2015-04-13 NOTE — Telephone Encounter (Signed)
Patient returned phone call. °

## 2015-04-13 NOTE — Telephone Encounter (Signed)
Pt scheduled for CT of A/P at [facility] CT 04/17/15@3pm . Pt to be NPO after 11am. Pt to drink bottle 1 of contrast at 1pm, bottle 2 of contrast at 2pm. Pt aware of appt and will come and pick up contrast.

## 2015-04-13 NOTE — Telephone Encounter (Signed)
I have left message for the patient to call back 

## 2015-04-13 NOTE — Telephone Encounter (Signed)
CT abd pelvis with contrast Hx of enteritis by previous CT Now epigastric pain/n/v

## 2015-04-17 ENCOUNTER — Ambulatory Visit (INDEPENDENT_AMBULATORY_CARE_PROVIDER_SITE_OTHER)
Admission: RE | Admit: 2015-04-17 | Discharge: 2015-04-17 | Disposition: A | Discharge status: Home / Self Care | Payer: BLUE CROSS/BLUE SHIELD | Source: Ambulatory Visit | Attending: Internal Medicine | Admitting: Internal Medicine

## 2015-04-17 DIAGNOSIS — R1013 Epigastric pain: Secondary | ICD-10-CM

## 2015-04-17 MED ORDER — IOHEXOL 300 MG/ML  SOLN
100.0000 mL | Freq: Once | INTRAMUSCULAR | Status: AC | PRN
  Administered 2015-04-17: 100 mL via INTRAVENOUS

## 2015-05-26 DIAGNOSIS — Z79899 Other long term (current) drug therapy: Secondary | ICD-10-CM | POA: Diagnosis not present

## 2015-05-26 DIAGNOSIS — R252 Cramp and spasm: Secondary | ICD-10-CM | POA: Diagnosis not present

## 2015-06-07 ENCOUNTER — Encounter: Payer: Self-pay | Admitting: *Deleted

## 2015-06-14 DIAGNOSIS — M25572 Pain in left ankle and joints of left foot: Secondary | ICD-10-CM | POA: Diagnosis not present

## 2015-06-14 DIAGNOSIS — M5136 Other intervertebral disc degeneration, lumbar region: Secondary | ICD-10-CM | POA: Diagnosis not present

## 2015-06-14 DIAGNOSIS — M67472 Ganglion, left ankle and foot: Secondary | ICD-10-CM | POA: Diagnosis not present

## 2015-06-20 ENCOUNTER — Ambulatory Visit: Payer: BLUE CROSS/BLUE SHIELD | Admitting: Physician Assistant

## 2015-06-22 ENCOUNTER — Ambulatory Visit: Payer: BLUE CROSS/BLUE SHIELD | Admitting: Physician Assistant

## 2015-07-04 ENCOUNTER — Other Ambulatory Visit: Payer: Self-pay | Admitting: Internal Medicine

## 2015-07-05 DIAGNOSIS — R252 Cramp and spasm: Secondary | ICD-10-CM | POA: Diagnosis not present

## 2015-07-05 DIAGNOSIS — R200 Anesthesia of skin: Secondary | ICD-10-CM | POA: Diagnosis not present

## 2015-07-05 DIAGNOSIS — R634 Abnormal weight loss: Secondary | ICD-10-CM | POA: Diagnosis not present

## 2015-07-05 DIAGNOSIS — R232 Flushing: Secondary | ICD-10-CM | POA: Diagnosis not present

## 2015-07-13 DIAGNOSIS — D649 Anemia, unspecified: Secondary | ICD-10-CM | POA: Diagnosis not present

## 2015-07-13 DIAGNOSIS — R944 Abnormal results of kidney function studies: Secondary | ICD-10-CM | POA: Diagnosis not present

## 2015-07-14 ENCOUNTER — Emergency Department (HOSPITAL_COMMUNITY)
Admission: EM | Admit: 2015-07-14 | Discharge: 2015-07-14 | Disposition: A | Discharge status: Home / Self Care | Payer: BLUE CROSS/BLUE SHIELD | Attending: Emergency Medicine | Admitting: Emergency Medicine

## 2015-07-14 ENCOUNTER — Emergency Department (HOSPITAL_COMMUNITY): Payer: BLUE CROSS/BLUE SHIELD

## 2015-07-14 ENCOUNTER — Encounter (HOSPITAL_COMMUNITY): Payer: Self-pay | Admitting: Emergency Medicine

## 2015-07-14 DIAGNOSIS — D649 Anemia, unspecified: Secondary | ICD-10-CM | POA: Diagnosis not present

## 2015-07-14 DIAGNOSIS — M79602 Pain in left arm: Secondary | ICD-10-CM | POA: Insufficient documentation

## 2015-07-14 DIAGNOSIS — R0602 Shortness of breath: Secondary | ICD-10-CM | POA: Diagnosis not present

## 2015-07-14 DIAGNOSIS — R0789 Other chest pain: Secondary | ICD-10-CM | POA: Diagnosis not present

## 2015-07-14 DIAGNOSIS — I10 Essential (primary) hypertension: Secondary | ICD-10-CM | POA: Insufficient documentation

## 2015-07-14 DIAGNOSIS — R079 Chest pain, unspecified: Secondary | ICD-10-CM | POA: Diagnosis not present

## 2015-07-14 DIAGNOSIS — Z79899 Other long term (current) drug therapy: Secondary | ICD-10-CM | POA: Insufficient documentation

## 2015-07-14 LAB — CBC
HCT: 27.5 % — ABNORMAL LOW (ref 36.0–46.0)
Hemoglobin: 7.9 g/dL — ABNORMAL LOW (ref 12.0–15.0)
MCH: 21.4 pg — ABNORMAL LOW (ref 26.0–34.0)
MCHC: 28.7 g/dL — ABNORMAL LOW (ref 30.0–36.0)
MCV: 74.3 fL — ABNORMAL LOW (ref 78.0–100.0)
Platelets: 292 10*3/uL (ref 150–400)
RBC: 3.7 MIL/uL — ABNORMAL LOW (ref 3.87–5.11)
RDW: 16 % — ABNORMAL HIGH (ref 11.5–15.5)
WBC: 5.6 10*3/uL (ref 4.0–10.5)

## 2015-07-14 LAB — BASIC METABOLIC PANEL
Anion gap: 5 (ref 5–15)
BUN: 13 mg/dL (ref 6–20)
CO2: 25 mmol/L (ref 22–32)
Calcium: 8.7 mg/dL — ABNORMAL LOW (ref 8.9–10.3)
Chloride: 111 mmol/L (ref 101–111)
Creatinine, Ser: 1.2 mg/dL — ABNORMAL HIGH (ref 0.44–1.00)
GFR calc Af Amer: >60 mL/min (ref >60)
GFR calc non Af Amer: 52 mL/min — ABNORMAL LOW (ref >60)
Glucose, Bld: 121 mg/dL — ABNORMAL HIGH (ref 65–99)
Potassium: 4.2 mmol/L (ref 3.5–5.1)
Sodium: 141 mmol/L (ref 135–145)

## 2015-07-14 LAB — I-STAT TROPONIN, ED
Troponin i, poc: 0 ng/mL (ref 0.00–0.08)
Troponin i, poc: 0 ng/mL (ref 0.00–0.08)

## 2015-07-14 LAB — TYPE AND SCREEN
ABO/RH(D): O POS
Antibody Screen: NEGATIVE

## 2015-07-14 LAB — ABO/RH: ABO/RH(D): O POS

## 2015-07-14 LAB — BRAIN NATRIURETIC PEPTIDE: B Natriuretic Peptide: 363.6 pg/mL — ABNORMAL HIGH (ref 0.0–100.0)

## 2015-07-14 LAB — D-DIMER, QUANTITATIVE: D-Dimer, Quant: 1.03 ug/mL-FEU — ABNORMAL HIGH (ref 0.00–0.50)

## 2015-07-14 MED ORDER — IOPAMIDOL (ISOVUE-370) INJECTION 76%
INTRAVENOUS | Status: AC
  Administered 2015-07-14: 100 mL
  Filled 2015-07-14: qty 100

## 2015-07-14 MED ORDER — SODIUM CHLORIDE 0.9 % IV SOLN
INTRAVENOUS | Status: DC
  Administered 2015-07-14: 06:00:00 via INTRAVENOUS

## 2015-07-14 MED ORDER — GI COCKTAIL ~~LOC~~: 30.0000 mL | Freq: Once | ORAL | Status: DC

## 2015-07-14 MED ORDER — ONDANSETRON HCL 4 MG/2ML IJ SOLN
4.0000 mg | Freq: Once | INTRAMUSCULAR | Status: DC
  Filled 2015-07-14: qty 2

## 2015-07-14 MED ORDER — ASPIRIN 81 MG PO CHEW
324.0000 mg | CHEWABLE_TABLET | Freq: Once | ORAL | Status: AC
  Administered 2015-07-14: 324 mg via ORAL
  Filled 2015-07-14: qty 4

## 2015-07-14 MED ORDER — MORPHINE SULFATE (PF) 4 MG/ML IV SOLN
4.0000 mg | Freq: Once | INTRAVENOUS | Status: DC
  Filled 2015-07-14: qty 1

## 2015-07-14 NOTE — Discharge Instructions (Signed)
°Nonspecific Chest Pain  °Chest pain can be caused by many different conditions. There is always a chance that your pain could be related to something serious, such as a heart attack or a blood clot in your lungs. Chest pain can also be caused by conditions that are not life-threatening. If you have chest pain, it is very important to follow up with your health care provider. °CAUSES  °Chest pain can be caused by: °· Heartburn. °· Pneumonia or bronchitis. °· Anxiety or stress. °· Inflammation around your heart (pericarditis) or lung (pleuritis or pleurisy). °· A blood clot in your lung. °· A collapsed lung (pneumothorax). It can develop suddenly on its own (spontaneous pneumothorax) or from trauma to the chest. °· Shingles infection (varicella-zoster virus). °· Heart attack. °· Damage to the bones, muscles, and cartilage that make up your chest wall. This can include: °¨ Bruised bones due to injury. °¨ Strained muscles or cartilage due to frequent or repeated coughing or overwork. °¨ Fracture to one or more ribs. °¨ Sore cartilage due to inflammation (costochondritis). °RISK FACTORS  °Risk factors for chest pain may include: °· Activities that increase your risk for trauma or injury to your chest. °· Respiratory infections or conditions that cause frequent coughing. °· Medical conditions or overeating that can cause heartburn. °· Heart disease or family history of heart disease. °· Conditions or health behaviors that increase your risk of developing a blood clot. °· Having had chicken pox (varicella zoster). °SIGNS AND SYMPTOMS °Chest pain can feel like: °· Burning or tingling on the surface of your chest or deep in your chest. °· Crushing, pressure, aching, or squeezing pain. °· Dull or sharp pain that is worse when you move, cough, or take a deep breath. °· Pain that is also felt in your back, neck, shoulder, or arm, or pain that spreads to any of these areas. °Your chest pain may come and go, or it may stay  constant. °DIAGNOSIS °Lab tests or other studies may be needed to find the cause of your pain. Your health care provider may have you take a test called an ambulatory ECG (electrocardiogram). An ECG records your heartbeat patterns at the time the test is performed. You may also have other tests, such as: °· Transthoracic echocardiogram (TTE). During echocardiography, sound waves are used to create a picture of all of the heart structures and to look at how blood flows through your heart. °· Transesophageal echocardiogram (TEE). This is a more advanced imaging test that obtains images from inside your body. It allows your health care provider to see your heart in finer detail. °· Cardiac monitoring. This allows your health care provider to monitor your heart rate and rhythm in real time. °· Holter monitor. This is a portable device that records your heartbeat and can help to diagnose abnormal heartbeats. It allows your health care provider to track your heart activity for several days, if needed. °· Stress tests. These can be done through exercise or by taking medicine that makes your heart beat more quickly. °· Blood tests. °· Imaging tests. °TREATMENT  °Your treatment depends on what is causing your chest pain. Treatment may include: °· Medicines. These may include: °¨ Acid blockers for heartburn. °¨ Anti-inflammatory medicine. °¨ Pain medicine for inflammatory conditions. °¨ Antibiotic medicine, if an infection is present. °¨ Medicines to dissolve blood clots. °¨ Medicines to treat coronary artery disease. °· Supportive care for conditions that do not require medicines. This may include: °¨ Resting. °¨ Applying heat   or cold packs to injured areas.  Limiting activities until pain decreases. HOME CARE INSTRUCTIONS  If you were prescribed an antibiotic medicine, finish it all even if you start to feel better.  Avoid any activities that bring on chest pain.  Do not use any tobacco products, including  cigarettes, chewing tobacco, or electronic cigarettes. If you need help quitting, ask your health care provider.  Do not drink alcohol.  Take medicines only as directed by your health care provider.  Keep all follow-up visits as directed by your health care provider. This is important. This includes any further testing if your chest pain does not go away.  If heartburn is the cause for your chest pain, you may be told to keep your head raised (elevated) while sleeping. This reduces the chance that acid will go from your stomach into your esophagus.  Make lifestyle changes as directed by your health care provider. These may include:  Getting regular exercise. Ask your health care provider to suggest some activities that are safe for you.  Eating a heart-healthy diet. A registered dietitian can help you to learn healthy eating options.  Maintaining a healthy weight.  Managing diabetes, if necessary.  Reducing stress. SEEK MEDICAL CARE IF:  Your chest pain does not go away after treatment.  You have a rash with blisters on your chest.  You have a fever. SEEK IMMEDIATE MEDICAL CARE IF:   Your chest pain is worse.  You have an increasing cough, or you cough up blood.  You have severe abdominal pain.  You have severe weakness.  You faint.  You have chills.  You have sudden, unexplained chest discomfort.  You have sudden, unexplained discomfort in your arms, back, neck, or jaw.  You have shortness of breath at any time.  You suddenly start to sweat, or your skin gets clammy.  You feel nauseous or you vomit.  You suddenly feel light-headed or dizzy.  Your heart begins to beat quickly, or it feels like it is skipping beats. These symptoms may represent a serious problem that is an emergency. Do not wait to see if the symptoms will go away. Get medical help right away. Call your local emergency services (911 in the [place] Do not drive yourself to the hospital.   This  information is not intended to replace advice given to you by your health care provider. Make sure you discuss any questions you have with your health care provider.   Document Released: 11/14/2004 Document Revised: 02/25/2014 Document Reviewed: 09/10/2013 Elsevier Interactive Patient Education 2016 [place] Blood Cell Count WHY AM I HAVING THIS TEST? A red blood cell (RBC) count measures the total number of circulating RBCs in a portion of your blood. RBCs are made in your bone marrow and circulate in your bloodstream for about 120 days until they are destroyed by your spleen. These cells are important because they contain hemoglobin, which carries oxygen to body tissues. Having an abnormally low number of RBCs causes anemia. You may have this test as a part of a complete blood count (CBC) test that counts RBCs, white blood cells, and platelets. It may also be done if your health care provider suspects you have certain medical conditions. These may include abnormally low production of red blood cells or abnormally fast destruction of red blood cells (hemolysis). WHAT KIND OF SAMPLE IS TAKEN? A blood sample is required for this test. It is usually collected by inserting a needle into a vein or  by sticking a finger with a small needle. HOW DO I PREPARE FOR THE TEST? There is no preparation required for this test. WHAT ARE THE REFERENCE RANGES? Reference ranges are considered healthy rangesestablished after testing a large group of healthy people. Reference rangesmay vary among different people, labs, and hospitals. It is your responsibility to obtain your test results. Ask the lab or department performing the test when and how you will get your results. RBCs are measured in SI units. A reference range of values for this test is as follows:  Adult or elderly:  Female: 4.7-6.1.  Female: 4.2-5.4.  Children:  Newborn: 4.8-7.1.  2-8 weeks: 4.0-6.0.  2-6 months: 3.5-5.5.  6  months-1 year: 3.5-5.2.  1-6 years: 4.0-5.5.  6-18 years: 4.0-5.5. WHAT DO THE RESULTS MEAN? Test results that are higher than the reference ranges can indicate a number of health conditions. These may include:  Erythrocytosis, which means excess RBCs.  Heart disease that is present at birth (congenital).  Severe chronic obstructive pulmonary disease (COPD).  Polycythemia vera. This is a disorder of the bone marrow that causes overproduction of RBCs.  Severe dehydration.  Disorders that cause abnormally formed hemoglobin (hemoglobinopathy) or complications from these disorders. Test results that are lower than the reference ranges can indicate a number of health conditions. These may include:  Anemia.  Hemoglobinopathy.  Long-term (chronic) fluid overload due to liver disease. This is also called cirrhosis.  Hemolytic anemia.  Hemorrhage.  Dietary deficiencies such as low iron or vitamin B12 intake.  Bone marrow failure.  Prosthetic heart valves.  Kidney disease.  Pregnancy.  Rheumatoid or collagen vascular diseases such as rheumatoid arthritis, systemic lupus erythematosus, and sarcoidosis.  Cancers such as lymphoma, leukemia, Hodgkin disease, and multiple myeloma. Talk with your health care provider to discuss your results, treatment options, and if necessary, the need for more tests. Talk with your health care provider if you have any questions about your results.   This information is not intended to replace advice given to you by your health care provider. Make sure you discuss any questions you have with your health care provider.   Document Released: 03/09/2004 Document Revised: 02/25/2014 Document Reviewed: 06/30/2013 Elsevier Interactive Patient Education 2016 [employer].  Anemia, Nonspecific Anemia is a condition in which the concentration of red blood cells or hemoglobin in the blood is below normal. Hemoglobin is a substance in red blood cells that  carries oxygen to the tissues of the body. Anemia results in not enough oxygen reaching these tissues.  CAUSES  Common causes of anemia include:   Excessive bleeding. Bleeding may be internal or external. This includes excessive bleeding from periods (in women) or from the intestine.   Poor nutrition.   Chronic kidney, thyroid, and liver disease.  Bone marrow disorders that decrease red blood cell production.  Cancer and treatments for cancer.  HIV, AIDS, and their treatments.  Spleen problems that increase red blood cell destruction.  Blood disorders.  Excess destruction of red blood cells due to infection, medicines, and autoimmune disorders. SIGNS AND SYMPTOMS   Minor weakness.   Dizziness.   Headache.  Palpitations.   Shortness of breath, especially with exercise.   Paleness.  Cold sensitivity.  Indigestion.  Nausea.  Difficulty sleeping.  Difficulty concentrating. Symptoms may occur suddenly or they may develop slowly.  DIAGNOSIS  Additional blood tests are often needed. These help your health care provider determine the best treatment. Your health care provider will check your stool for blood  and look for other causes of blood loss.  TREATMENT  Treatment varies depending on the cause of the anemia. Treatment can include:   Supplements of iron, vitamin V56, or folic acid.   Hormone medicines.   A blood transfusion. This may be needed if blood loss is severe.   Hospitalization. This may be needed if there is significant continual blood loss.   Dietary changes.  Spleen removal. HOME CARE INSTRUCTIONS Keep all follow-up appointments. It often takes many weeks to correct anemia, and having your health care provider check on your condition and your response to treatment is very important. SEEK IMMEDIATE MEDICAL CARE IF:   You develop extreme weakness, shortness of breath, or chest pain.   You become dizzy or have trouble  concentrating.  You develop heavy vaginal bleeding.   You develop a rash.   You have bloody or black, tarry stools.   You faint.   You vomit up blood.   You vomit repeatedly.   You have abdominal pain.  You have a fever or persistent symptoms for more than 2-3 days.   You have a fever and your symptoms suddenly get worse.   You are dehydrated.  MAKE SURE YOU:  Understand these instructions.  Will watch your condition.  Will get help right away if you are not doing well or get worse.   This information is not intended to replace advice given to you by your health care provider. Make sure you discuss any questions you have with your health care provider.   Document Released: 03/14/2004 Document Revised: 10/07/2012 Document Reviewed: 07/31/2012 Elsevier Interactive Patient Education 2016 [place].  Blood Transfusion  A blood transfusion is a procedure in which you receive donated blood through an IV tube. You may need a blood transfusion because of illness, surgery, or injury. The blood may come from a donor, or it may be your own blood that you donated previously. The blood given in a transfusion is made up of different types of cells. You may receive:  Red blood cells. These carry oxygen and replace lost blood.  Platelets. These control bleeding.  Plasma. Thishelps blood to clot. If you have hemophilia or another clotting disorder, you may also receive other types of blood products. LET [facility] CARE PROVIDER KNOW ABOUT:  Any allergies you have.  All medicines you are taking, including vitamins, herbs, eye drops, creams, and over-the-counter medicines.  Previous problems you or members of your family have had with the use of anesthetics.  Any blood disorders you have.  Previous surgeries you have had.  Any medical conditions you may have.  Any previous reactions you have had during a blood transfusion.  RISKS AND COMPLICATIONS Generally,  this is a safe procedure. However, problems may occur, including:  Having an allergic reaction to something in the donated blood.  Fever. This may be a reaction to the white blood cells in the transfused blood.  Iron overload. This can happen from having many transfusions.  Transfusion-related acute lung injury (TRALI). This is a rare reaction that causes lung damage. The cause is not known.TRALI can occur within hours of a transfusion or several days later.  Sudden (acute) or delayed hemolytic reactions. This happens if your blood does not match the cells in your transfusion. Your body's defense system (immune system) may try to attack the new cells. This complication is rare.  Infection. This is rare. BEFORE THE PROCEDURE  You may have a blood test to determine your blood  type. This is necessary to know what kind of blood your body will accept.  If you are going to have a planned surgery, you may donate your own blood. This may be done in case you need to have a transfusion.  If you have had an allergic reaction to a transfusion in the past, you may be given medicine to help prevent a reaction. Take this medicine only as directed by your health care provider.  You will have your temperature, blood pressure, and pulse monitored before the transfusion. PROCEDURE   An IV will be started in your hand or arm.  The bag of donated blood will be attached to your IV tube and given into your vein.  Your temperature, blood pressure, and pulse will be monitored regularly during the transfusion. This monitoring is done to detect early signs of a transfusion reaction.  If you have any signs or symptoms of a reaction, your transfusion will be stopped and you may be given medicine.  When the transfusion is over, your IV will be removed.  Pressure may be applied to the IV site for a few minutes.  A bandage (dressing) will be applied. The procedure may vary among health care providers and  hospitals. AFTER THE PROCEDURE  Your blood pressure, temperature, and pulse will be monitored regularly.   This information is not intended to replace advice given to you by your health care provider. Make sure you discuss any questions you have with your health care provider.   Document Released: 02/02/2000 Document Revised: 02/25/2014 Document Reviewed: 12/15/2013 Elsevier Interactive Patient Education [employer].

## 2015-07-14 NOTE — ED Provider Notes (Signed)
Patient is a 50 year old female presents to the ER with complaint of left-sided chest pain with left arm tingling, positional in nature, intermittent, associated with intermittent palpitations and shortness of breath.  She is not tachypneic or hypoxic in the ER, normal heart rate, blood pressure elevated.    Initial evaluation was done by Delos Haring PA-C, given to me at shift change with labs pending.  At the time of my initial exam, 0700, patient had no active chest pain, is well-appearing  Chest pain presentation is somewhat atypical.  With radiation to the patient's arm, history of hypertension, Heart score 2-3, lower risk for ACS.  Labs resulted shortly after taking over care of pt.   Results for orders placed or performed during the hospital encounter of 0000000  Basic metabolic panel  Result Value Ref Range   Sodium 141 135 - 145 mmol/L   Potassium 4.2 3.5 - 5.1 mmol/L   Chloride 111 101 - 111 mmol/L   CO2 25 22 - 32 mmol/L   Glucose, Bld 121 (H) 65 - 99 mg/dL   BUN 13 6 - 20 mg/dL   Creatinine, Ser 1.20 (H) 0.44 - 1.00 mg/dL   Calcium 8.7 (L) 8.9 - 10.3 mg/dL   GFR calc non Af Amer 52 (L) >60 mL/min   GFR calc Af Amer >60 >60 mL/min   Anion gap 5 5 - 15  CBC  Result Value Ref Range   WBC 5.6 4.0 - 10.5 K/uL   RBC 3.70 (L) 3.87 - 5.11 MIL/uL   Hemoglobin 7.9 (L) 12.0 - 15.0 g/dL   HCT 27.5 (L) 36.0 - 46.0 %   MCV 74.3 (L) 78.0 - 100.0 fL   MCH 21.4 (L) 26.0 - 34.0 pg   MCHC 28.7 (L) 30.0 - 36.0 g/dL   RDW 16.0 (H) 11.5 - 15.5 %   Platelets 292 150 - 400 K/uL  D-dimer, quantitative (not at [facility])  Result Value Ref Range   D-Dimer, Quant 1.03 (H) 0.00 - 0.50 ug/mL-FEU  Brain natriuretic peptide  Result Value Ref Range   B Natriuretic Peptide 363.6 (H) 0.0 - 100.0 pg/mL  I-stat troponin, ED  Result Value Ref Range   Troponin i, poc 0.00 0.00 - 0.08 ng/mL   Comment 3           Dg Chest 2 View  07/14/2015  CLINICAL DATA:  Chest pain and LEFT arm tingling. History of  hypertension bronchitis. EXAM: CHEST  2 VIEW COMPARISON:  Chest radiograph November 20, 2012 FINDINGS: Cardiomediastinal silhouette is normal. The lungs are clear without pleural effusions or focal consolidations. Trachea projects midline and there is no pneumothorax. Soft tissue planes and included osseous structures are non-suspicious. Surgical clips in the included right abdomen compatible with cholecystectomy. IMPRESSION: Normal chest. Electronically Signed   By: Elon Alas M.D.   On: 07/14/2015 06:25   EKG Interpretation  Date/Time:  Friday Jul 14 2015 05:35:41 EDT Ventricular Rate:  73 PR Interval:  154 QRS Duration: 99 QT Interval:  417 QTC Calculation: 459 R Axis:   15 Text Interpretation:  Sinus rhythm Low voltage, precordial leads Borderline T abnormalities, diffuse leads Similar to prior EKG 12/18/2014  Confirmed by LIU MD, DANA [phone]) on 07/14/2015 6:08:30 AM  Labs significant for worsening microcytic anemia, w/ hemoglobin 7.9, positive d-dimer of 1.03, BNP elevated at 363. Intermittent chest pain, palpitations and shortness of breath may secondary to blood loss/symptomatic anemia. Patient denies any black tarry stools, no bright red blood per rectum,  she does have heavy periods, LMP 07/09/2015, lasted 5 days long, described as very have a bleeding with soaking of both tampons and pads requiring change every 2 hours, she has not been evaluated by OB/GYN for menorrhagia. She states her period ended yesterday she currently does not have any vaginal bleeding.  The patient appears mildly symptomatic.  I ordered type and screen, patient has had a blood transfusion in the past she estimates it was one year ago.  Will ambulate patient, to evaluate exertional symptoms.  With positive d-dimer will have to obtain CT angio chest to rule out PE.  She does not exhibit clinical signs or symptoms of DVT, no other identified risk factors using Wells' criteria for PE-  0.0 points, low risk, unable to  r/o with PERC only due to age.  D-dimer + and +BNP may be suggestive of PE, no right heart strain evident on EKG, if she has PE, anticipate is being hemodynamically stable PE (smaller, mildly symptomatic).  The patient was ambulated with pulse ox monitoring and maintaining oxygen saturation 100%, heart rate 90s.     Pt may also have other etiologies of chest pain including but not limited to new congestive heart failure with elevated BNP, considered pericardial effusion with positional exacerbation of pain.  Myocarditis/pericarditis less likely, although in the differential, although patient denies any recent viral illness.  Troponin negative, which is reassuring.  No ST elevation.  EKG sinus rhythm, low voltage in precordial leads (pericardial effusion ?), no TWI, u wave present.  GI etiology of CP is still also likely, intended to give GI cocktail, however pt had no CP.      Dr. troponin was drawn which was negative,     Discussed the patient all of her results today. She previously stated that she had a blood transfusion in the past however chart review with the patient reveals that she had an iron infusion because she was not able to tolerate iron supplements. Discussed with her at length the benefits of receiving blood transfusion however did not believe it was emergent because she appears hemodynamically stable, she can and really without chest pain, tachycardia, no hypotension. No active chest pain.  We discussed return precautions, she verbalizes understanding. She does state that she has felt very tired and fatigued and has had more difficult time at work and sleeping slightly more. She was offered blood transfusion here as a level hemoglobin would qualify her for transfusion today but she declined and states she will follow-up with her PCP for follow-up and possible iron replacement. She is encouraged to follow-up with her PCP regarding her other cardiac labs today, chest pain and blood  pressure. She also was encouraged to be seen by OB/GYN for heavy vaginal bleeding with her menses that are causing her anemia.   Delsa Grana, PA-C 07/16/15 0244  Davonna Belling, MD 07/20/15 [id number]

## 2015-07-14 NOTE — ED Provider Notes (Signed)
CSN: [id number]     Arrival date & time 07/14/15  [id number] History   First MD Initiated Contact with Patient 07/14/15 0535     Chief Complaint  Patient presents with  . Chest Pain  . Extremity Pain  . Medication Reaction     (Consider location/radiation/quality/duration/timing/severity/associated sxs/prior Treatment) HPI   Patient has PMH of hypertension, bronchitis, Schatzki's ring, hiatal hernia, anemia, external hemorrhoid comes to the ER with left sided CP and left arm tingling for the past 2 days. She had a medication changed recently and these symptoms started after. Per triage note the patient is tearful.  Patient states that her [place] PCP changed her medication from Lisinopril to Carvedilol, since day two she has had the intermittent CP associated with the left arm tingling. She also reports sensation of her heart fluttering intermittently. She says she In sleep at all last because of the pain. Sitting up or sitting forward improves the pain, lying back makes the pain worse. She has not had any fevers, coughing, back pain, abdominal pain, lower extremity swelling. She states that she has never had chest pains before or any problems with her heart, she has not had any sort of testing done on her heart before. Patient states that she is tearful because the pain has really started to scare her. She has not tried taking anything for the pain, pain is currently 6 out of 10 and located under her left breast.   Past Medical History  Diagnosis Date  . Hypertension   . Bronchitis   . Schatzki's ring   . Hiatal hernia   . Iron deficiency anemia   . External hemorrhoid    Past Surgical History  Procedure Laterality Date  . Cesarean section      x 2  . Cholecystectomy     Family History  Problem Relation Age of Onset  . Hypertension Mother   . Diabetes Mother   . Diabetes Father   . Diabetes Brother    Social History  Substance Use Topics  . Smoking status: Never Smoker    . Smokeless tobacco: Never Used  . Alcohol Use: No   OB History    No data available     Review of Systems  Review of Systems All other systems negative except as documented in the HPI. All pertinent positives and negatives as reviewed in the HPI.   Allergies  Review of patient's allergies indicates no known allergies.  Home Medications   Prior to Admission medications   Medication Sig Start Date End Date Taking? Authorizing Provider  acetaminophen (TYLENOL) 500 MG tablet Take 1,000 mg by mouth every 6 (six) hours as needed (for pain.).   Yes Historical Provider, MD  carvedilol (COREG) 6.25 MG tablet Take 6.25 mg by mouth 2 (two) times daily with a meal.   Yes Historical Provider, MD  hyoscyamine (LEVSIN SL) 0.125 MG SL tablet Take 1-2 tablet by mouth every 4-6 hours as needed. Patient taking differently: Take 0.25 mg by mouth every 4 (four) hours as needed for cramping.  03/28/15  Yes Jerene Bears, MD  Multiple Vitamin (MULTIVITAMIN WITH MINERALS) TABS tablet Take 1 tablet by mouth daily.   Yes Historical Provider, MD  ondansetron (ZOFRAN ODT) 4 MG disintegrating tablet Take 1 tablet by mouth every 6-8 hours as needed for nausea/vomiting 03/28/15  Yes Jerene Bears, MD  pantoprazole (PROTONIX) 40 MG tablet Take 1 tablet (40 mg total) by mouth 2 (two) times daily before a  meal. 03/28/15  Yes Jerene Bears, MD   BP 139/83 mmHg  Pulse 69  Temp(Src) 98.3 F (36.8 C) (Oral)  Resp 17  Ht 5\' 5"  (1.651 m)  Wt 95.255 kg  BMI 34.95 kg/m2  SpO2 100%  LMP 07/09/2015 Physical Exam  Constitutional: She appears well-developed and well-nourished. No distress.  HENT:  Head: Normocephalic and atraumatic.  Nose: Nose normal.  Mouth/Throat: Uvula is midline, oropharynx is clear and moist and mucous membranes are normal.  Eyes: Pupils are equal, round, and reactive to light.  Neck: Normal range of motion. Neck supple.  Cardiovascular: Normal rate and regular rhythm.   Pulmonary/Chest: Effort  normal and breath sounds normal. She has no decreased breath sounds. She has no wheezes.  Chest pain is not reproducible  Abdominal: Soft.  No signs of abdominal distention  Musculoskeletal:  No LE swelling  Neurological: She is alert.  Acting at baseline  Skin: Skin is warm and dry. No rash noted.  Psychiatric:  + tearful  Nursing note and vitals reviewed.   ED Course  Procedures (including critical care time) Labs Review Labs Reviewed  BASIC METABOLIC PANEL - Abnormal; Notable for the following:    Glucose, Bld 121 (*)    Creatinine, Ser 1.20 (*)    Calcium 8.7 (*)    GFR calc non Af Amer 52 (*)    All other components within normal limits  CBC - Abnormal; Notable for the following:    RBC 3.70 (*)    Hemoglobin 7.9 (*)    HCT 27.5 (*)    MCV 74.3 (*)    MCH 21.4 (*)    MCHC 28.7 (*)    RDW 16.0 (*)    All other components within normal limits  D-DIMER, QUANTITATIVE (NOT AT [facility]) - Abnormal; Notable for the following:    D-Dimer, Quant 1.03 (*)    All other components within normal limits  BRAIN NATRIURETIC PEPTIDE - Abnormal; Notable for the following:    B Natriuretic Peptide 363.6 (*)    All other components within normal limits  I-STAT TROPOININ, ED  I-STAT TROPOININ, ED  TYPE AND SCREEN  ABO/RH    Imaging Review Dg Chest 2 View  07/14/2015  CLINICAL DATA:  Chest pain and LEFT arm tingling. History of hypertension bronchitis. EXAM: CHEST  2 VIEW COMPARISON:  Chest radiograph November 20, 2012 FINDINGS: Cardiomediastinal silhouette is normal. The lungs are clear without pleural effusions or focal consolidations. Trachea projects midline and there is no pneumothorax. Soft tissue planes and included osseous structures are non-suspicious. Surgical clips in the included right abdomen compatible with cholecystectomy. IMPRESSION: Normal chest. Electronically Signed   By: Elon Alas M.D.   On: 07/14/2015 06:25   Ct Angio Chest Pe W/cm &/or Wo Cm  07/14/2015   CLINICAL DATA:  Chest pain below LEFT breast for 3 days, palpitations, mild shortness of breath, elevated D-dimer, history hypertension, question pulmonary embolism EXAM: CT ANGIOGRAPHY CHEST WITH CONTRAST TECHNIQUE: Multidetector CT imaging of the chest was performed using the standard protocol during bolus administration of intravenous contrast. Multiplanar CT image reconstructions and MIPs were obtained to evaluate the vascular anatomy. CONTRAST:  100 cc Isovue 370 IV COMPARISON:  None FINDINGS: Aorta normal caliber without aneurysm or gross dissection. Pulmonary arteries well opacified and patent. Few beam hardening artifacts from dense contrast in the SVC traverse RIGHT pulmonary arteries centrally. No evidence of pulmonary embolism. No thoracic adenopathy. Gallbladder surgically absent. Visualized upper abdomen otherwise unremarkable. Lungs clear. No  pulmonary infiltrate, pleural effusion, pneumothorax or mass. Osseous structures unremarkable. Review of the MIP images confirms the above findings. IMPRESSION: Normal CTA chest. No evidence of pulmonary embolism. Electronically Signed   By: Lavonia Dana M.D.   On: 07/14/2015 08:47   I have personally reviewed and evaluated these images and lab results as part of my medical decision-making.   EKG Interpretation   Date/Time:  Friday Jul 14 2015 05:35:41 EDT Ventricular Rate:  73 PR Interval:  154 QRS Duration: 99 QT Interval:  417 QTC Calculation: 459 R Axis:   15 Text Interpretation:  Sinus rhythm Low voltage, precordial leads  Borderline T abnormalities, diffuse leads Similar to prior EKG 12/18/2014   Confirmed by LIU MD, DANA [phone]) on 07/14/2015 6:08:30 AM      MDM   Final diagnoses:  Symptomatic anemia  Chest pain, unspecified chest pain type    At end of shift patient care handed off to Delsa Grana, PA-C.  The patient is currently waiting for labwork and chest xray. She has been given aspirin and pain medication. Angina can be a  side effect of carvedilol but it is uncommon.      Delos Haring, PA-C 07/15/15 [id number]  Forde Dandy, MD 07/18/15 1045

## 2015-07-14 NOTE — ED Notes (Signed)
Pt presents from home with LEFT sided CP and LEFT arm tingling x 2 day; pt reports medication was changed and symptoms began after; pt tearful during triage assessment

## 2015-07-14 NOTE — ED Notes (Signed)
Pt was ambulated with no difficulty. Pt O2 stayed at 100% Pulse was in low 90s. Pt had no complaints.

## 2015-07-14 NOTE — ED Notes (Signed)
Patient transported to CT 

## 2015-07-14 NOTE — ED Notes (Signed)
Patient transported to X-ray 

## 2015-07-14 NOTE — ED Notes (Signed)
PA at bedside.

## 2015-07-18 ENCOUNTER — Other Ambulatory Visit: Payer: Self-pay | Admitting: Physician Assistant

## 2015-07-18 DIAGNOSIS — N6310 Unspecified lump in the right breast, unspecified quadrant: Secondary | ICD-10-CM

## 2015-07-20 DIAGNOSIS — H6123 Impacted cerumen, bilateral: Secondary | ICD-10-CM | POA: Diagnosis not present

## 2015-07-20 DIAGNOSIS — H9203 Otalgia, bilateral: Secondary | ICD-10-CM | POA: Diagnosis not present

## 2015-07-20 DIAGNOSIS — H9313 Tinnitus, bilateral: Secondary | ICD-10-CM | POA: Diagnosis not present

## 2015-07-20 DIAGNOSIS — H93293 Other abnormal auditory perceptions, bilateral: Secondary | ICD-10-CM | POA: Diagnosis not present

## 2015-07-21 ENCOUNTER — Other Ambulatory Visit: Payer: Self-pay | Admitting: Physician Assistant

## 2015-07-21 ENCOUNTER — Ambulatory Visit
Admission: RE | Admit: 2015-07-21 | Discharge: 2015-07-21 | Disposition: A | Discharge status: Home / Self Care | Payer: BLUE CROSS/BLUE SHIELD | Source: Ambulatory Visit | Attending: Physician Assistant | Admitting: Physician Assistant

## 2015-07-21 DIAGNOSIS — R921 Mammographic calcification found on diagnostic imaging of breast: Secondary | ICD-10-CM

## 2015-07-21 DIAGNOSIS — N6310 Unspecified lump in the right breast, unspecified quadrant: Secondary | ICD-10-CM

## 2015-07-21 DIAGNOSIS — N63 Unspecified lump in breast: Secondary | ICD-10-CM | POA: Diagnosis not present

## 2015-07-24 ENCOUNTER — Ambulatory Visit
Admission: RE | Admit: 2015-07-24 | Discharge: 2015-07-24 | Disposition: A | Discharge status: Home / Self Care | Payer: BLUE CROSS/BLUE SHIELD | Source: Ambulatory Visit | Attending: Physician Assistant | Admitting: Physician Assistant

## 2015-07-24 DIAGNOSIS — R921 Mammographic calcification found on diagnostic imaging of breast: Secondary | ICD-10-CM | POA: Diagnosis not present

## 2015-07-24 DIAGNOSIS — D242 Benign neoplasm of left breast: Secondary | ICD-10-CM | POA: Diagnosis not present

## 2015-07-27 DIAGNOSIS — R760 Raised antibody titer: Secondary | ICD-10-CM | POA: Diagnosis not present

## 2015-07-27 DIAGNOSIS — G4762 Sleep related leg cramps: Secondary | ICD-10-CM | POA: Diagnosis not present

## 2015-07-27 DIAGNOSIS — M545 Low back pain: Secondary | ICD-10-CM | POA: Diagnosis not present

## 2015-08-04 ENCOUNTER — Other Ambulatory Visit: Payer: Self-pay | Admitting: Surgery

## 2015-08-04 DIAGNOSIS — D649 Anemia, unspecified: Secondary | ICD-10-CM | POA: Diagnosis not present

## 2015-08-04 DIAGNOSIS — I10 Essential (primary) hypertension: Secondary | ICD-10-CM | POA: Diagnosis not present

## 2015-08-04 DIAGNOSIS — D241 Benign neoplasm of right breast: Secondary | ICD-10-CM

## 2015-08-10 DIAGNOSIS — N926 Irregular menstruation, unspecified: Secondary | ICD-10-CM | POA: Diagnosis not present

## 2015-08-10 DIAGNOSIS — N898 Other specified noninflammatory disorders of vagina: Secondary | ICD-10-CM | POA: Diagnosis not present

## 2015-08-16 ENCOUNTER — Other Ambulatory Visit: Payer: Self-pay | Admitting: Surgery

## 2015-08-16 DIAGNOSIS — D241 Benign neoplasm of right breast: Secondary | ICD-10-CM

## 2015-09-05 ENCOUNTER — Encounter (HOSPITAL_COMMUNITY): Payer: Self-pay

## 2015-09-05 ENCOUNTER — Encounter (HOSPITAL_COMMUNITY)
Admission: RE | Admit: 2015-09-05 | Discharge: 2015-09-05 | Disposition: A | Discharge status: Home / Self Care | Payer: BLUE CROSS/BLUE SHIELD | Source: Ambulatory Visit | Attending: Surgery | Admitting: Surgery

## 2015-09-05 DIAGNOSIS — Z01812 Encounter for preprocedural laboratory examination: Secondary | ICD-10-CM | POA: Insufficient documentation

## 2015-09-05 DIAGNOSIS — D649 Anemia, unspecified: Secondary | ICD-10-CM | POA: Diagnosis not present

## 2015-09-05 HISTORY — DX: Unspecified osteoarthritis, unspecified site: M19.90

## 2015-09-05 HISTORY — DX: Gastro-esophageal reflux disease without esophagitis: K21.9

## 2015-09-05 LAB — BASIC METABOLIC PANEL
Anion gap: 8 (ref 5–15)
BUN: 12 mg/dL (ref 6–20)
CO2: 27 mmol/L (ref 22–32)
Calcium: 9.2 mg/dL (ref 8.9–10.3)
Chloride: 106 mmol/L (ref 101–111)
Creatinine, Ser: 1.19 mg/dL — ABNORMAL HIGH (ref 0.44–1.00)
GFR calc Af Amer: >60 mL/min (ref >60)
GFR calc non Af Amer: 53 mL/min — ABNORMAL LOW (ref >60)
Glucose, Bld: 81 mg/dL (ref 65–99)
Potassium: 3.7 mmol/L (ref 3.5–5.1)
Sodium: 141 mmol/L (ref 135–145)

## 2015-09-05 LAB — CBC
HCT: 39.8 % (ref 36.0–46.0)
Hemoglobin: 12.5 g/dL (ref 12.0–15.0)
MCH: 25.9 pg — ABNORMAL LOW (ref 26.0–34.0)
MCHC: 31.4 g/dL (ref 30.0–36.0)
MCV: 82.4 fL (ref 78.0–100.0)
Platelets: 201 10*3/uL (ref 150–400)
RBC: 4.83 MIL/uL (ref 3.87–5.11)
RDW: 18.6 % — ABNORMAL HIGH (ref 11.5–15.5)
WBC: 6.2 10*3/uL (ref 4.0–10.5)

## 2015-09-05 LAB — HCG, SERUM, QUALITATIVE: Preg, Serum: NEGATIVE

## 2015-09-05 NOTE — Progress Notes (Signed)
Pt. Sch. For radioactive seed implant on 09/08/2015.

## 2015-09-05 NOTE — Progress Notes (Signed)
Since in the ED in 06/2015, was seen by PCP, D. Scirfre, at [place]. [address], has been taking iron supplement & states on last check Hgb had risen to  11.2. Pt. Reports that she is feeling much better & her complaints of dizziness, weakness has resolved.

## 2015-09-05 NOTE — Pre-Procedure Instructions (Signed)
CHRISTEAN REZEK  09/05/2015      [id number] - [address], [address] [place] [place] [address] Phone: [phone] Fax: [phone]  [id number] - [place] [address], [place] [id number] [place] [id number] Phone: [phone] Fax: [phone]    Your procedure is scheduled on  09/11/2015  Report to [facility] Admitting at 7:00 A.M.  Call this number if you have problems the morning of surgery:  [phone]   Remember:  Do not eat food or drink liquids after midnight.  Take these medicines the morning of surgery with A SIP OF WATER : Protonix    Do not wear jewelry, make-up or nail polish.   Do not wear lotions, powders, or perfumes.  You may not wear deoderant.   Do not shave 48 hours prior to surgery.    Do not bring valuables to the hospital.   [facility] is not responsible for any belongings or valuables.  Contacts, dentures or bridgework may not be worn into surgery.  Leave your suitcase in the car.  After surgery it may be brought to your room.  For patients admitted to the hospital, discharge time will be determined by your treatment team.  Patients discharged the day of surgery will not be allowed to drive home.   Name and phone number of your driver:   [name]   Special instructions:  Special Instructions: [facility] - Preparing for Surgery  Before surgery, you can play an important role.  Because skin is not sterile, your skin needs to be as free of germs as possible.  You can reduce the number of germs on you skin by washing with CHG (chlorahexidine gluconate) soap before surgery.  CHG is an antiseptic cleaner which kills germs and bonds with the skin to continue killing germs even after washing.  Please DO NOT use if you have an allergy to CHG or antibacterial soaps.  If your skin  becomes reddened/irritated stop using the CHG and inform your nurse when you arrive at Short Stay.  Do not shave (including legs and underarms) for at least 48 hours prior to the first CHG shower.  You may shave your face.  Please follow these instructions carefully:   1.  Shower with CHG Soap the night before surgery and the  morning of Surgery.  2.  If you choose to wash your hair, wash your hair first as usual with your  normal shampoo.  3.  After you shampoo, rinse your hair and body thoroughly to remove the  Shampoo.  4.  Use CHG as you would any other liquid soap.  You can apply chg directly to the skin and wash gently with scrungie or a clean washcloth.  5.  Apply the CHG Soap to your body ONLY FROM THE NECK DOWN.    Do not use on open wounds or open sores.  Avoid contact with your eyes, ears, mouth and genitals (private parts).  Wash genitals (private parts)   with your normal soap.  6.  Wash thoroughly, paying special attention to the area where your surgery will be performed.  7.  Thoroughly rinse your body with warm water from the neck down.  8.  DO NOT shower/wash with your normal soap after using and rinsing off   the CHG Soap.  9.  Fraser Din  yourself dry with a clean towel.            10.  Wear clean pajamas.            11.  Place clean sheets on your bed the night of your first shower and do not sleep with pets.  Day of Surgery  Do not apply any lotions/deodorants the morning of surgery.  Please wear clean clothes to the hospital/surgery center.  Please read over the following fact sheets that you were given. Pain Booklet and Surgical Site Infection Prevention

## 2015-09-08 ENCOUNTER — Ambulatory Visit
Admission: RE | Admit: 2015-09-08 | Discharge: 2015-09-08 | Disposition: A | Discharge status: Home / Self Care | Payer: BLUE CROSS/BLUE SHIELD | Source: Ambulatory Visit | Attending: Surgery | Admitting: Surgery

## 2015-09-08 DIAGNOSIS — D241 Benign neoplasm of right breast: Secondary | ICD-10-CM | POA: Diagnosis not present

## 2015-09-10 NOTE — H&P (Signed)
Gabriela Lowe  Location: [facility] Patient #: [id number] DOB: [birth year] Single / Language: Cleophus Molt / Race: White Female   History of Present Illness  The patient is a 50 year old female who presents with a breast mass. This is a pleasant patient referred by Maude Leriche PA after recent diagnosis of a papilloma of the right breast. This was found on screening mammography. A stereotactic biopsy was performed showing an intraductal papilloma with no atypia. The actual final pathology report shows this is a left breast biopsy but is actually right. She has had no previous problems with her breasts. She denies nipple discharge. There is no history of biopsy. There is no history of breast cancer in the family. She does have some issues with chronic anemia.   Other Problems  Arthritis Gastroesophageal Reflux Disease Hemorrhoids  Past Surgical History Breast Biopsy Right. Cesarean Section - Multiple  Diagnostic Studies History Elbert Ewings, CMA;  Colonoscopy within last year Mammogram 1-3 years ago  Allergies Elbert Ewings, [place]; No Known Drug Allergies  Medication History  Metoprolol Succinate ER (25MG  Tablet ER 24HR, Oral) Active. Ferrous Sulfate (325 (65 Fe)MG Tablet, Oral) Active. Medications Reconciled  Social History  Caffeine use Coffee. No alcohol use No drug use Tobacco use Never smoker.  Family History  Alcohol Abuse Father. Arthritis Mother. Diabetes Mellitus Brother, Father. Hypertension Mother.  Pregnancy / Birth History Gravida 3 Irregular periods Maternal age 27-30    Review of Systems   General Present- Night Sweats and Weight Loss. Not Present- Appetite Loss, Chills, Fatigue, Fever and Weight Gain. Skin Not Present- Change in Wart/Mole, Dryness, Hives, Jaundice, New Lesions, Non-Healing Wounds, Rash and Ulcer. HEENT Present- Seasonal Allergies and Wears glasses/contact lenses. Not Present- Earache, Hearing  Loss, Hoarseness, Nose Bleed, Oral Ulcers, Ringing in the Ears, Sinus Pain, Sore Throat, Visual Disturbances and Yellow Eyes. Respiratory Not Present- Bloody sputum, Chronic Cough, Difficulty Breathing, Snoring and Wheezing. Gastrointestinal Present- Hemorrhoids. Not Present- Abdominal Pain, Bloating, Bloody Stool, Change in Bowel Habits, Chronic diarrhea, Constipation, Difficulty Swallowing, Excessive gas, Gets full quickly at meals, Indigestion, Nausea, Rectal Pain and Vomiting. Female Genitourinary Not Present- Frequency, Nocturia, Painful Urination, Pelvic Pain and Urgency. Musculoskeletal Present- Joint Pain. Not Present- Back Pain, Joint Stiffness, Muscle Pain, Muscle Weakness and Swelling of Extremities. Neurological Not Present- Decreased Memory, Fainting, Headaches, Numbness, Seizures, Tingling, Tremor, Trouble walking and Weakness. Hematology Not Present- Easy Bruising, Excessive bleeding, Gland problems, HIV and Persistent Infections.  Vitals   Weight: 220 lb Height: 64in Body Surface Area: 2.04 m Body Mass Index: 37.76 kg/m  Temp.: 97.30F(Temporal)  Pulse: 70 (Regular)  BP: 128/82 (Sitting, Left Arm, Standard)   Physical Exam   General Mental Status-Alert. General Appearance-Consistent with stated age. Hydration-Well hydrated. Voice-Normal.  Head and Neck Head-normocephalic, atraumatic with no lesions or palpable masses. Trachea-midline. Thyroid Gland Characteristics - normal size and consistency.  Eye Eyeball - Bilateral-Extraocular movements intact. Sclera/Conjunctiva - Bilateral-No scleral icterus.  Chest and Lung Exam Chest and lung exam reveals -quiet, even and easy respiratory effort with no use of accessory muscles and on auscultation, normal breath sounds, no adventitious sounds and normal vocal resonance. Inspection Chest Wall - Normal. Back - normal.  Breast Breast - Left-Symmetric, Non Tender, No Biopsy scars, no  Dimpling, No Inflammation, No Lumpectomy scars, No Mastectomy scars, No Peau d' Orange. Breast - Right-Symmetric and Biopsy scar, Non Tender, No Dimpling, No Inflammation, No Lumpectomy scars, No Mastectomy scars, No Peau d' Orange. Note: There is a tiny scar and  a small amount of ecchymosis from her biopsy of the right breast Breast Lump-No Palpable Breast Mass.  Cardiovascular Cardiovascular examination reveals -normal heart sounds, regular rate and rhythm with no murmurs and normal pedal pulses bilaterally.  Abdomen Inspection Inspection of the abdomen reveals - No Hernias. Skin - Scar - no surgical scars. Palpation/Percussion Palpation and Percussion of the abdomen reveal - Soft, Non Tender, No Rebound tenderness, No Rigidity (guarding) and No hepatosplenomegaly. Auscultation Auscultation of the abdomen reveals - Bowel sounds normal.  Neurologic Neurologic evaluation reveals -alert and oriented x 3 with no impairment of recent or remote memory. Mental Status-Normal.  Musculoskeletal Normal Exam - Left-Upper Extremity Strength Normal and Lower Extremity Strength Normal. Normal Exam - Right-Upper Extremity Strength Normal and Lower Extremity Strength Normal.  Lymphatic Head & Neck  General Head & Neck Lymphatics: Bilateral - Description - Normal. Axillary  General Axillary Region: Bilateral - Description - Normal. Tenderness - Non Tender. Femoral & Inguinal  Generalized Femoral & Inguinal Lymphatics: Bilateral - Description - Normal. Tenderness - Non Tender.    Assessment & Plan    INTRADUCTAL PAPILLOMA OF BREAST, RIGHT (D24.1)  Impression: I discussed the diagnosis with her friend in detail. I gave her a copy of the pathology report. Radioactive seed localized right breast lumpectomy is recommended for histologic evaluation. I discussed the surgical procedure in detail. I discussed the risk which includes but is not limited to bleeding, infection, need for  further surgery if malignancy is present, postoperative recovery, etc. She understands and wishes to proceed with surgery which will be scheduled

## 2015-09-11 ENCOUNTER — Ambulatory Visit
Admission: RE | Admit: 2015-09-11 | Discharge: 2015-09-11 | Disposition: A | Discharge status: Home / Self Care | Payer: BLUE CROSS/BLUE SHIELD | Source: Ambulatory Visit | Attending: Surgery | Admitting: Surgery

## 2015-09-11 ENCOUNTER — Encounter (HOSPITAL_COMMUNITY)
Admission: RE | Disposition: A | Discharge status: Home / Self Care | Payer: Self-pay | Source: Ambulatory Visit | Attending: Surgery

## 2015-09-11 ENCOUNTER — Ambulatory Visit (HOSPITAL_COMMUNITY): Payer: BLUE CROSS/BLUE SHIELD | Admitting: Anesthesiology

## 2015-09-11 ENCOUNTER — Ambulatory Visit (HOSPITAL_COMMUNITY)
Admission: RE | Admit: 2015-09-11 | Discharge: 2015-09-11 | Disposition: A | Discharge status: Home / Self Care | Payer: BLUE CROSS/BLUE SHIELD | Source: Ambulatory Visit | Attending: Surgery | Admitting: Surgery

## 2015-09-11 ENCOUNTER — Encounter (HOSPITAL_COMMUNITY): Payer: Self-pay | Admitting: *Deleted

## 2015-09-11 DIAGNOSIS — D241 Benign neoplasm of right breast: Secondary | ICD-10-CM

## 2015-09-11 DIAGNOSIS — N6021 Fibroadenosis of right breast: Secondary | ICD-10-CM | POA: Diagnosis not present

## 2015-09-11 DIAGNOSIS — M1990 Unspecified osteoarthritis, unspecified site: Secondary | ICD-10-CM | POA: Insufficient documentation

## 2015-09-11 DIAGNOSIS — K219 Gastro-esophageal reflux disease without esophagitis: Secondary | ICD-10-CM | POA: Insufficient documentation

## 2015-09-11 DIAGNOSIS — I10 Essential (primary) hypertension: Secondary | ICD-10-CM | POA: Diagnosis not present

## 2015-09-11 DIAGNOSIS — Z79899 Other long term (current) drug therapy: Secondary | ICD-10-CM | POA: Insufficient documentation

## 2015-09-11 DIAGNOSIS — N6011 Diffuse cystic mastopathy of right breast: Secondary | ICD-10-CM | POA: Insufficient documentation

## 2015-09-11 DIAGNOSIS — R928 Other abnormal and inconclusive findings on diagnostic imaging of breast: Secondary | ICD-10-CM | POA: Diagnosis not present

## 2015-09-11 HISTORY — PX: BREAST LUMPECTOMY WITH RADIOACTIVE SEED LOCALIZATION: SHX6424

## 2015-09-11 SURGERY — BREAST LUMPECTOMY WITH RADIOACTIVE SEED LOCALIZATION
Anesthesia: General | Site: Breast | Laterality: Right

## 2015-09-11 MED ORDER — PROPOFOL 10 MG/ML IV BOLUS
INTRAVENOUS | Status: AC
  Filled 2015-09-11: qty 20

## 2015-09-11 MED ORDER — MIDAZOLAM HCL 5 MG/5ML IJ SOLN
INTRAMUSCULAR | Status: DC | PRN
  Administered 2015-09-11: 2 mg via INTRAVENOUS

## 2015-09-11 MED ORDER — BUPIVACAINE-EPINEPHRINE (PF) 0.25% -1:200000 IJ SOLN
INTRAMUSCULAR | Status: AC
  Filled 2015-09-11: qty 30

## 2015-09-11 MED ORDER — BUPIVACAINE-EPINEPHRINE 0.25% -1:200000 IJ SOLN
INTRAMUSCULAR | Status: DC | PRN
  Administered 2015-09-11: 20 mL

## 2015-09-11 MED ORDER — LIDOCAINE HCL (CARDIAC) 20 MG/ML IV SOLN: INTRAVENOUS | Status: DC | PRN

## 2015-09-11 MED ORDER — PROPOFOL 10 MG/ML IV BOLUS
INTRAVENOUS | Status: DC | PRN
Start: 2015-09-11 — End: 2015-09-11
  Administered 2015-09-11: 150 mg via INTRAVENOUS

## 2015-09-11 MED ORDER — LIDOCAINE 2% (20 MG/ML) 5 ML SYRINGE
INTRAMUSCULAR | Status: DC | PRN
  Administered 2015-09-11: 100 mg via INTRAVENOUS

## 2015-09-11 MED ORDER — CEFAZOLIN SODIUM-DEXTROSE 2-3 GM-% IV SOLR
INTRAVENOUS | Status: DC | PRN
  Administered 2015-09-11: 2 g via INTRAVENOUS

## 2015-09-11 MED ORDER — FENTANYL CITRATE (PF) 250 MCG/5ML IJ SOLN
INTRAMUSCULAR | Status: AC
  Filled 2015-09-11: qty 5

## 2015-09-11 MED ORDER — OXYCODONE-ACETAMINOPHEN 5-325 MG PO TABS: 1.0000 | ORAL_TABLET | ORAL | 0 refills | Status: DC | PRN

## 2015-09-11 MED ORDER — ONDANSETRON HCL 4 MG/2ML IJ SOLN
INTRAMUSCULAR | Status: DC | PRN
  Administered 2015-09-11: 4 mg via INTRAVENOUS

## 2015-09-11 MED ORDER — MIDAZOLAM HCL 2 MG/2ML IJ SOLN
INTRAMUSCULAR | Status: AC
  Filled 2015-09-11: qty 2

## 2015-09-11 MED ORDER — 0.9 % SODIUM CHLORIDE (POUR BTL) OPTIME
TOPICAL | Status: DC | PRN
  Administered 2015-09-11: 1000 mL

## 2015-09-11 MED ORDER — LACTATED RINGERS IV SOLN
INTRAVENOUS | Status: DC
  Administered 2015-09-11: 08:00:00 via INTRAVENOUS

## 2015-09-11 MED ORDER — CEFAZOLIN SODIUM-DEXTROSE 2-4 GM/100ML-% IV SOLN
2.0000 g | INTRAVENOUS | Status: DC
  Filled 2015-09-11: qty 100

## 2015-09-11 MED ORDER — ONDANSETRON HCL 4 MG/2ML IJ SOLN
INTRAMUSCULAR | Status: AC
  Filled 2015-09-11: qty 2

## 2015-09-11 MED ORDER — LIDOCAINE 2% (20 MG/ML) 5 ML SYRINGE
INTRAMUSCULAR | Status: AC
  Filled 2015-09-11: qty 5

## 2015-09-11 MED ORDER — FENTANYL CITRATE (PF) 100 MCG/2ML IJ SOLN
INTRAMUSCULAR | Status: DC | PRN
  Administered 2015-09-11: 50 ug via INTRAVENOUS

## 2015-09-11 SURGICAL SUPPLY — 40 items
APPLIER CLIP 9.375 MED OPEN (MISCELLANEOUS) ×3
APR CLP MED 9.3 20 MLT OPN (MISCELLANEOUS) ×1
BINDER BREAST LRG (GAUZE/BANDAGES/DRESSINGS) IMPLANT
BINDER BREAST XLRG (GAUZE/BANDAGES/DRESSINGS) IMPLANT
BLADE SURG 15 STRL LF DISP TIS (BLADE) ×1 IMPLANT
BLADE SURG 15 STRL SS (BLADE) ×3
CANISTER SUCTION 2500CC (MISCELLANEOUS) ×3 IMPLANT
CHLORAPREP W/TINT 26ML (MISCELLANEOUS) ×3 IMPLANT
CLIP APPLIE 9.375 MED OPEN (MISCELLANEOUS) ×1 IMPLANT
COVER PROBE W GEL 5X96 (DRAPES) ×3 IMPLANT
COVER SURGICAL LIGHT HANDLE (MISCELLANEOUS) ×3 IMPLANT
DEVICE DUBIN SPECIMEN MAMMOGRA (MISCELLANEOUS) ×3 IMPLANT
DRAPE CHEST BREAST 15X10 FENES (DRAPES) ×3 IMPLANT
DRAPE UTILITY XL STRL (DRAPES) ×3 IMPLANT
ELECT CAUTERY BLADE 6.4 (BLADE) ×3 IMPLANT
ELECT REM PT RETURN 9FT ADLT (ELECTROSURGICAL) ×3
ELECTRODE REM PT RTRN 9FT ADLT (ELECTROSURGICAL) ×1 IMPLANT
GLOVE SURG SIGNA 7.5 PF LTX (GLOVE) ×3 IMPLANT
GOWN STRL REUS W/ TWL LRG LVL3 (GOWN DISPOSABLE) ×1 IMPLANT
GOWN STRL REUS W/ TWL XL LVL3 (GOWN DISPOSABLE) ×1 IMPLANT
GOWN STRL REUS W/TWL LRG LVL3 (GOWN DISPOSABLE) ×3
GOWN STRL REUS W/TWL XL LVL3 (GOWN DISPOSABLE) ×3
KIT BASIN OR (CUSTOM PROCEDURE TRAY) ×3 IMPLANT
KIT MARKER MARGIN INK (KITS) ×3 IMPLANT
LIQUID BAND (GAUZE/BANDAGES/DRESSINGS) ×3 IMPLANT
NDL HYPO 25X1 1.5 SAFETY (NEEDLE) ×1 IMPLANT
NEEDLE HYPO 25X1 1.5 SAFETY (NEEDLE) ×3 IMPLANT
NS IRRIG 1000ML POUR BTL (IV SOLUTION) IMPLANT
PACK SURGICAL SETUP 50X90 (CUSTOM PROCEDURE TRAY) ×3 IMPLANT
PENCIL BUTTON HOLSTER BLD 10FT (ELECTRODE) ×3 IMPLANT
SPONGE LAP 18X18 X RAY DECT (DISPOSABLE) ×3 IMPLANT
SUT MNCRL AB 4-0 PS2 18 (SUTURE) ×3 IMPLANT
SUT VIC AB 3-0 SH 18 (SUTURE) ×3 IMPLANT
SYR BULB 3OZ (MISCELLANEOUS) ×3 IMPLANT
SYR CONTROL 10ML LL (SYRINGE) ×3 IMPLANT
TOWEL OR 17X24 6PK STRL BLUE (TOWEL DISPOSABLE) ×3 IMPLANT
TOWEL OR 17X26 10 PK STRL BLUE (TOWEL DISPOSABLE) ×3 IMPLANT
TUBE CONNECTING 12'X1/4 (SUCTIONS) ×1
TUBE CONNECTING 12X1/4 (SUCTIONS) ×2 IMPLANT
YANKAUER SUCT BULB TIP NO VENT (SUCTIONS) ×3 IMPLANT

## 2015-09-11 NOTE — Anesthesia Postprocedure Evaluation (Signed)
Anesthesia Post Note  Patient: Gabriela Lowe  Procedure(s) Performed: Procedure(s) (LRB): RIGHT BREAST LUMPECTOMY WITH RADIOACTIVE SEED LOCALIZATION (Right)  Anesthesia Post Evaluation  Last Vitals:  Vitals:   09/11/15 0945 09/11/15 0955  BP:  110/87  Pulse:  (!) 59  Resp:  13  Temp: (P) 36.1 C 36.4 C    Last Pain:  Vitals:   09/11/15 0707  TempSrc: Oral                 Rush Farmer ELENA

## 2015-09-11 NOTE — Interval H&P Note (Signed)
History and Physical Interval Note: no change in H and P  09/11/2015 7:06 AM  Early Chars  has presented today for surgery, with the diagnosis of Right breast intraductal papilloma   The various methods of treatment have been discussed with the patient and family. After consideration of risks, benefits and other options for treatment, the patient has consented to  Procedure(s): RIGHT BREAST LUMPECTOMY WITH RADIOACTIVE SEED LOCALIZATION (Right) as a surgical intervention .  The patient's history has been reviewed, patient examined, no change in status, stable for surgery.  I have reviewed the patient's chart and labs.  Questions were answered to the patient's satisfaction.     Roslyn Else A

## 2015-09-11 NOTE — Anesthesia Procedure Notes (Signed)
Procedure Name: LMA Insertion Date/Time: 09/11/2015 9:12 AM Performed by: Rush Farmer E Pre-anesthesia Checklist: Patient identified, Emergency Drugs available, Suction available, Patient being monitored and Timeout performed Patient Re-evaluated:Patient Re-evaluated prior to inductionOxygen Delivery Method: Circle system utilized Preoxygenation: Pre-oxygenation with 100% oxygen Intubation Type: IV induction LMA: LMA inserted LMA Size: 4.0 Number of attempts: 1 Placement Confirmation: positive ETCO2 and breath sounds checked- equal and bilateral Tube secured with: Tape Dental Injury: Teeth and Oropharynx as per pre-operative assessment

## 2015-09-11 NOTE — Transfer of Care (Signed)
Immediate Anesthesia Transfer of Care Note  Patient: Gabriela Lowe  Procedure(s) Performed: Procedure(s): RIGHT BREAST LUMPECTOMY WITH RADIOACTIVE SEED LOCALIZATION (Right)  Patient Location: PACU  Anesthesia Type:General  Level of Consciousness: awake, alert  and oriented  Airway & Oxygen Therapy: Patient Spontanous Breathing and Patient connected to nasal cannula oxygen  Post-op Assessment: Report given to RN, Post -op Vital signs reviewed and stable and Patient moving all extremities  Post vital signs: Reviewed and stable  Last Vitals:  Vitals:   09/11/15 0707  BP: (!) 144/65  Pulse: 60  Resp: 18  Temp: 36.7 C    Last Pain:  Vitals:   09/11/15 0707  TempSrc: Oral         Complications: No apparent anesthesia complications

## 2015-09-11 NOTE — Anesthesia Preprocedure Evaluation (Addendum)
Anesthesia Evaluation  Patient identified by MRN, date of birth, ID band Patient awake    Reviewed: Allergy & Precautions, NPO status , Patient's Chart, lab work & pertinent test results  Airway Mallampati: II  TM Distance: >3 FB Neck ROM: Full    Dental  (+) Teeth Intact, Dental Advisory Given   Pulmonary neg pulmonary ROS,    Pulmonary exam normal breath sounds clear to auscultation       Cardiovascular hypertension, Pt. on medications (-) angina(-) Past MI Normal cardiovascular exam Rhythm:Regular Rate:Normal     Neuro/Psych negative neurological ROS  negative psych ROS   GI/Hepatic Neg liver ROS, hiatal hernia, GERD  Medicated,Schatzki's ring   Endo/Other  negative endocrine ROSObesity   Renal/GU negative Renal ROS     Musculoskeletal  (+) Arthritis , Osteoarthritis,    Abdominal   Peds  Hematology negative hematology ROS (+)   Anesthesia Other Findings Day of surgery medications reviewed with the patient.  Right breast intraductal papilloma   Reproductive/Obstetrics                            Anesthesia Physical Anesthesia Plan  ASA: II  Anesthesia Plan: General   Post-op Pain Management:    Induction: Intravenous  Airway Management Planned: LMA  Additional Equipment:   Intra-op Plan:   Post-operative Plan: Extubation in OR  Informed Consent: I have reviewed the patients History and Physical, chart, labs and discussed the procedure including the risks, benefits and alternatives for the proposed anesthesia with the patient or authorized representative who has indicated his/her understanding and acceptance.   Dental advisory given  Plan Discussed with: CRNA  Anesthesia Plan Comments: (Risks/benefits of general anesthesia discussed with patient including risk of damage to teeth, lips, gum, and tongue, nausea/vomiting, allergic reactions to medications, and the  possibility of heart attack, stroke and death.  All patient questions answered.  Patient wishes to proceed.)       Anesthesia Quick Evaluation

## 2015-09-11 NOTE — Op Note (Signed)
Gabriela Lowe, Gabriela Lowe NO.:  [id number]  MEDICAL RECORD NO.:  [id number]  LOCATION:  [facility]                         FACILITY:  [facility]  PHYSICIAN:  Coralie Keens, M.D. DATE OF BIRTH:  [birth year]  DATE OF PROCEDURE:  09/11/2015 DATE OF DISCHARGE:                              OPERATIVE REPORT   PREOPERATIVE DIAGNOSIS:  Right breast intraductal papilloma.  POSTOPERATIVE DIAGNOSIS:  Right breast intraductal papilloma.  PROCEDURE:  Radioactive seed localized right breast lumpectomy.  SURGEON:  Coralie Keens, M.D.  ANESTHESIA:  General and 0.25% Marcaine.  ESTIMATED BLOOD LOSS:  Minimal.  INDICATIONS:  This is a 50 year old female who was found on screening mammography to have a mass in the right breast.  Stereotactic biopsy was performed confirming an intraductal papilloma.  Decision was made to proceed with a radioactive seed localized right breast lumpectomy.  FINDINGS:  The previously placed marker as well as the radioactive seed were found on x-ray to be in the biopsy specimen which was sent to Pathology for evaluation.  PROCEDURE IN DETAIL:  The patient was identified in the holding area as the correct patient.  The Neoprobe was used to confirm that the radioactive seed was indeed in the right breast.  She was then taken to the operating room.  She was placed supine on the operating room table and then general anesthesia was induced.  Her right breast was then prepped and draped in the usual sterile fashion.  I anesthetized the skin at the upper edge of the areola with Marcaine.  I then made a circumareolar incision with a scalpel.  I then took this down to the breast tissue with electrocautery.  With the aid of Neoprobe, I was able to dissect all the way down to the radioactive seed.  I then performed a lumpectomy that was in the central breast near the upper outer quadrant. Once the entire lumpectomy specimen was excised, I again confirmed that the  radioactive seed was in the specimen with the Neoprobe.  I painted all margins with marker paint.  X-ray was then used to confirm again that the radioactive seed and previous marker were in the biopsy specimen.  I then placed a single surgical clip into the biopsy cavity. I anesthetized further with Marcaine.  Hemostasis was achieved with the cautery.  The specimen was sent to Pathology for evaluation.  I then closed the subcutaneous tissue with interrupted 3-0 Vicryl sutures and closed the skin with a running 4-0 Monocryl. Skin glue was then applied.  The patient tolerated the procedure well. All the counts were correct at the end of the procedure.  The patient was then extubated in the operating room and taken in a stable condition to the recovery room.     Coralie Keens, M.D.     DB/MEDQ  D:  09/11/2015  T:  09/11/2015  Job:  [id number]

## 2015-09-11 NOTE — Op Note (Signed)
RIGHT BREAST LUMPECTOMY WITH RADIOACTIVE SEED LOCALIZATION  Procedure Note  Gabriela Lowe 09/11/2015   Pre-op Diagnosis: Right breast intraductal papilloma      Post-op Diagnosis: same  Procedure(s): RIGHT BREAST LUMPECTOMY WITH RADIOACTIVE SEED LOCALIZATION  Surgeon(s): Coralie Keens, MD  Anesthesia: General  Staff:  Circulator: Candie Mile, RN Scrub Person: Christen Bame, RN Circulator Assistant: Quincy Carnes, RN  Estimated Blood Loss: Minimal               Specimens: sent to path          [facility]   Date: 09/11/2015  Time: 9:46 AM

## 2015-09-11 NOTE — Discharge Instructions (Signed)
[facility] °Office Phone Number [phone] ° °BREAST BIOPSY/ PARTIAL MASTECTOMY: POST OP INSTRUCTIONS ° °Always review your discharge instruction sheet given to you by the facility where your surgery was performed. ° °IF YOU HAVE DISABILITY OR FAMILY LEAVE FORMS, YOU MUST BRING THEM TO THE OFFICE FOR PROCESSING.  DO NOT GIVE THEM TO YOUR DOCTOR. ° °1. A prescription for pain medication may be given to you upon discharge.  Take your pain medication as prescribed, if needed.  If narcotic pain medicine is not needed, then you may take acetaminophen (Tylenol) or ibuprofen (Advil) as needed. °2. Take your usually prescribed medications unless otherwise directed °3. If you need a refill on your pain medication, please contact your pharmacy.  They will contact our office to request authorization.  Prescriptions will not be filled after 5pm or on week-ends. °4. You should eat very light the first 24 hours after surgery, such as soup, crackers, pudding, etc.  Resume your normal diet the day after surgery. °5. Most patients will experience some swelling and bruising in the breast.  Ice packs and a good support bra will help.  Swelling and bruising can take several days to resolve.  °6. It is common to experience some constipation if taking pain medication after surgery.  Increasing fluid intake and taking a stool softener will usually help or prevent this problem from occurring.  A mild laxative (Milk of Magnesia or Miralax) should be taken according to package directions if there are no bowel movements after 48 hours. °7. Unless discharge instructions indicate otherwise, you may remove your bandages 24-48 hours after surgery, and you may shower at that time.  You may have steri-strips (small skin tapes) in place directly over the incision.  These strips should be left on the skin for 7-10 days.  If your surgeon used skin glue on the incision, you may shower in 24 hours.  The glue will flake off over the  next 2-3 weeks.  Any sutures or staples will be removed at the office during your follow-up visit. °8. ACTIVITIES:  You may resume regular daily activities (gradually increasing) beginning the next day.  Wearing a good support bra or sports bra minimizes pain and swelling.  You may have sexual intercourse when it is comfortable. °a. You may drive when you no longer are taking prescription pain medication, you can comfortably wear a seatbelt, and you can safely maneuver your car and apply brakes. °b. RETURN TO WORK:  ______________________________________________________________________________________ °9. You should see your doctor in the office for a follow-up appointment approximately two weeks after your surgery.  Your doctor’s nurse will typically make your follow-up appointment when she calls you with your pathology report.  Expect your pathology report 2-3 business days after your surgery.  You may call to check if you do not hear from us after three days. °10. OTHER INSTRUCTIONS: _______________________________________________________________________________________________ _____________________________________________________________________________________________________________________________________ °_____________________________________________________________________________________________________________________________________ °_____________________________________________________________________________________________________________________________________ ° °WHEN TO CALL YOUR DOCTOR: °1. Fever over 101.0 °2. Nausea and/or vomiting. °3. Extreme swelling or bruising. °4. Continued bleeding from incision. °5. Increased pain, redness, or drainage from the incision. ° °The clinic staff is available to answer your questions during regular business hours.  Please don’t hesitate to call and ask to speak to one of the nurses for clinical concerns.  If you have a medical emergency, go to the nearest  emergency room or call 911.  A surgeon from [facility] is always on call at the hospital. ° °For further questions, please visit [email or web address]  °

## 2015-09-12 ENCOUNTER — Encounter (HOSPITAL_COMMUNITY): Payer: Self-pay | Admitting: Surgery

## 2015-10-23 ENCOUNTER — Other Ambulatory Visit: Payer: Self-pay | Admitting: Internal Medicine

## 2015-10-25 DIAGNOSIS — D509 Iron deficiency anemia, unspecified: Secondary | ICD-10-CM | POA: Diagnosis not present

## 2015-10-25 DIAGNOSIS — M542 Cervicalgia: Secondary | ICD-10-CM | POA: Diagnosis not present

## 2015-10-25 DIAGNOSIS — R202 Paresthesia of skin: Secondary | ICD-10-CM | POA: Diagnosis not present

## 2015-10-30 ENCOUNTER — Encounter: Payer: Self-pay | Admitting: Gastroenterology

## 2015-10-30 ENCOUNTER — Encounter (INDEPENDENT_AMBULATORY_CARE_PROVIDER_SITE_OTHER): Payer: Self-pay

## 2015-10-30 ENCOUNTER — Ambulatory Visit (INDEPENDENT_AMBULATORY_CARE_PROVIDER_SITE_OTHER): Payer: BLUE CROSS/BLUE SHIELD | Admitting: Gastroenterology

## 2015-10-30 VITALS — BP 116/68 | HR 74 | Ht 65.0 in | Wt 214.0 lb

## 2015-10-30 DIAGNOSIS — R110 Nausea: Secondary | ICD-10-CM

## 2015-10-30 DIAGNOSIS — R140 Abdominal distension (gaseous): Secondary | ICD-10-CM

## 2015-10-30 DIAGNOSIS — R634 Abnormal weight loss: Secondary | ICD-10-CM | POA: Diagnosis not present

## 2015-10-30 DIAGNOSIS — R142 Eructation: Secondary | ICD-10-CM

## 2015-10-30 HISTORY — DX: Eructation: R14.2

## 2015-10-30 HISTORY — DX: Abdominal distension (gaseous): R14.0

## 2015-10-30 HISTORY — DX: Abnormal weight loss: R63.4

## 2015-10-30 MED ORDER — ONDANSETRON HCL 4 MG PO TABS: 4.0000 mg | ORAL_TABLET | Freq: Three times a day (TID) | ORAL | 1 refills | Status: DC | PRN

## 2015-10-30 MED ORDER — SUCRALFATE 1 GM/10ML PO SUSP: 1.0000 g | Freq: Three times a day (TID) | ORAL | 1 refills | Status: DC

## 2015-10-30 NOTE — Progress Notes (Addendum)
     10/30/2015 STEFANIA SENFF [id number] 08/28/1965   History of Present Illness:  This is a pleasant 50 year old female who is known to Dr. Hilarie Fredrickson with a past medical history of low iron anemia and hypertension. She was initially seen in 2015 to evaluate upper abdominal pain and CT scan which suggested enteritis and evaluation for possible Crohn's disease was pursued. She had an upper endoscopy and colonoscopy performed in January 2015 which was largely unrevealing. She did have a nonobstructing Schatzki's ring and a 2 cm hiatal hernia. The colon was normal to the terminal ileum. Follow-up capsule endoscopy was also normal.  She was last seen by Dr. Hilarie Fredrickson in February 2017 with complaints of epigastric abdominal pain, nausea, vomiting, and heartburn. He placed her on high dose acid suppression therapy with pantoprazole 40 mg twice daily and gave her Zofran to use as needed. Plan was to repeat CT scan if symptoms persisted or returned.  CRP was elevated at 8.31 at that time.  Hpylori was negative.  She presents to our office today with complaints of nausea, bloating, gassiness, weight loss. She tells me that she was doing well until about 2 or 3 weeks ago. She has nausea daily, mostly in the morning that seems to get better throughout the day. Her symptoms are actually improved by eating. She complains of a lot of upper abdominal bloating.  She has lost 9 pounds unintentionally since she was seen here in February.  Zofran does help with the nausea.  She is still taking her pantoprazole 40 mg BID and feels like to her heartburn and reflux issues are controlled with that. Denies any triggers that she can identify that have contributed to another flare of her symptoms. She says that she continues to watch what she eats.  Denies vomiting and abdominal pain.  Overall is moving her bowels well.  Sees GYN on a regular basis.   Current Medications, Allergies, Past Medical History, Past Surgical History, Family  History and Social History were reviewed in [employer] record.   Physical Exam: BP 116/68   Pulse 74   Ht 5\' 5"  (1.651 m)   Wt 214 lb (97.1 kg)   BMI 35.61 kg/m  General: Well developed black female in no acute distress Head: Normocephalic and atraumatic Eyes:  Sclerae anicteric, conjunctiva pink  Ears: Normal auditory acuity Lungs: Clear throughout to auscultation Heart: Regular rate and rhythm Abdomen: Soft, non-distended  Normal bowel sounds.  Mild epigastric TTP. Musculoskeletal: Symmetrical with no gross deformities  Extremities: No edema  Neurological: Alert oriented x 4, grossly non-focal Psychological:  Alert and cooperative. Normal mood and affect  Assessment and Recommendations: -50 year old female with recurrent nausea as well belching, abdominal bloating, and weight loss (9 pounds since February unintentionally):  Previous CT scan suggested enteritis but colonoscopy, EGD, and capsule study were unremarkable.  Last CRP elevated earlier this year.  Is on twice daily PPI, which seems to be helping her reflux but other symptoms recurrent.  Will schedule CT scan abdomen and pelvis with contrast.  Continue pantoprazole 40 mg BID and zofran prn (will refill).  Will also had carafate twice a day to see if this helps.    Addendum: Reviewed and agree with initial management. Jerene Bears, MD

## 2015-10-30 NOTE — Patient Instructions (Signed)
If you are age 50 or older, your body mass index should be between 23-30. Your Body mass index is 35.61 kg/m. If this is out of the aforementioned range listed, please consider follow up with your Primary Care Provider.  If you are age 4 or younger, your body mass index should be between 19-25. Your Body mass index is 35.61 kg/m. If this is out of the aformentioned range listed, please consider follow up with your Primary Care Provider.   You have been scheduled for a CT scan of the abdomen and pelvis at [facility] ([address]---this is in the same building as [facility]).   You are scheduled on 11-01-2015 at 930 am. You should arrive 15 minutes prior to your appointment time for registration. Please follow the written instructions below on the day of your exam:  WARNING: IF YOU ARE ALLERGIC TO IODINE/X-RAY DYE, PLEASE NOTIFY RADIOLOGY IMMEDIATELY AT [phone]! YOU WILL BE GIVEN A 13 HOUR PREMEDICATION PREP.  1) Do not eat or drink anything after 530am (4 hours prior to your test) 2) You have been given 2 bottles of oral contrast to drink. The solution may taste               better if refrigerated, but do NOT add ice or any other liquid to this solution. Shake             well before drinking.    Drink 1 bottle of contrast @ 730am (2 hours prior to your exam)  Drink 1 bottle of contrast @ 830am (1 hour prior to your exam)  You may take any medications as prescribed with a small amount of water except for the following: Metformin, Glucophage, Glucovance, Avandamet, Riomet, Fortamet, Actoplus Met, Janumet, Glumetza or Metaglip. The above medications must be held the day of the exam AND 48 hours after the exam.  The purpose of you drinking the oral contrast is to aid in the visualization of your intestinal tract. The contrast solution may cause some diarrhea. Before your exam is started, you will be given a small amount of fluid to drink. Depending on your individual  set of symptoms, you may also receive an intravenous injection of x-ray contrast/dye. Plan on being at [facility] for 30 minutes or longer, depending on the type of exam you are having performed.  This test typically takes 30-45 minutes to complete.  If you have any questions regarding your exam or if you need to reschedule, you may call the CT department at [phone] between the hours of 8:00 am and 5:00 pm, Monday-Friday.  ________________________________________________________________________  Thank you for choosing [facility]  Alonza Bogus, PA

## 2015-10-31 ENCOUNTER — Emergency Department (HOSPITAL_COMMUNITY)
Admission: EM | Admit: 2015-10-31 | Discharge: 2015-10-31 | Disposition: A | Discharge status: Home / Self Care | Payer: BLUE CROSS/BLUE SHIELD | Attending: Emergency Medicine | Admitting: Emergency Medicine

## 2015-10-31 ENCOUNTER — Emergency Department (HOSPITAL_COMMUNITY): Payer: BLUE CROSS/BLUE SHIELD

## 2015-10-31 ENCOUNTER — Encounter (HOSPITAL_COMMUNITY): Payer: Self-pay | Admitting: Emergency Medicine

## 2015-10-31 DIAGNOSIS — I10 Essential (primary) hypertension: Secondary | ICD-10-CM | POA: Diagnosis not present

## 2015-10-31 DIAGNOSIS — Z5181 Encounter for therapeutic drug level monitoring: Secondary | ICD-10-CM | POA: Insufficient documentation

## 2015-10-31 DIAGNOSIS — R51 Headache: Secondary | ICD-10-CM | POA: Insufficient documentation

## 2015-10-31 DIAGNOSIS — Z79899 Other long term (current) drug therapy: Secondary | ICD-10-CM | POA: Insufficient documentation

## 2015-10-31 DIAGNOSIS — R519 Headache, unspecified: Secondary | ICD-10-CM

## 2015-10-31 DIAGNOSIS — R200 Anesthesia of skin: Secondary | ICD-10-CM | POA: Diagnosis not present

## 2015-10-31 DIAGNOSIS — R42 Dizziness and giddiness: Secondary | ICD-10-CM | POA: Diagnosis not present

## 2015-10-31 LAB — COMPREHENSIVE METABOLIC PANEL
ALT: 16 U/L (ref 14–54)
AST: 25 U/L (ref 15–41)
Albumin: 3.8 g/dL (ref 3.5–5.0)
Alkaline Phosphatase: 74 U/L (ref 38–126)
Anion gap: 7 (ref 5–15)
BUN: 15 mg/dL (ref 6–20)
CO2: 30 mmol/L (ref 22–32)
Calcium: 9.4 mg/dL (ref 8.9–10.3)
Chloride: 103 mmol/L (ref 101–111)
Creatinine, Ser: 1.43 mg/dL — ABNORMAL HIGH (ref 0.44–1.00)
GFR calc Af Amer: 49 mL/min — ABNORMAL LOW (ref >60)
GFR calc non Af Amer: 42 mL/min — ABNORMAL LOW (ref >60)
Glucose, Bld: 159 mg/dL — ABNORMAL HIGH (ref 65–99)
Potassium: 3.9 mmol/L (ref 3.5–5.1)
Sodium: 140 mmol/L (ref 135–145)
Total Bilirubin: 0.9 mg/dL (ref 0.3–1.2)
Total Protein: 7.3 g/dL (ref 6.5–8.1)

## 2015-10-31 LAB — CBC
HCT: 41.1 % (ref 36.0–46.0)
Hemoglobin: 13 g/dL (ref 12.0–15.0)
MCH: 27 pg (ref 26.0–34.0)
MCHC: 31.6 g/dL (ref 30.0–36.0)
MCV: 85.4 fL (ref 78.0–100.0)
Platelets: 199 10*3/uL (ref 150–400)
RBC: 4.81 MIL/uL (ref 3.87–5.11)
RDW: 14.6 % (ref 11.5–15.5)
WBC: 6.7 10*3/uL (ref 4.0–10.5)

## 2015-10-31 LAB — I-STAT CHEM 8, ED
BUN: 18 mg/dL (ref 6–20)
Calcium, Ion: 1.14 mmol/L — ABNORMAL LOW (ref 1.15–1.40)
Chloride: 102 mmol/L (ref 101–111)
Creatinine, Ser: 1.4 mg/dL — ABNORMAL HIGH (ref 0.44–1.00)
Glucose, Bld: 154 mg/dL — ABNORMAL HIGH (ref 65–99)
HCT: 43 % (ref 36.0–46.0)
Hemoglobin: 14.6 g/dL (ref 12.0–15.0)
Potassium: 3.8 mmol/L (ref 3.5–5.1)
Sodium: 142 mmol/L (ref 135–145)
TCO2: 29 mmol/L (ref 0–100)

## 2015-10-31 LAB — DIFFERENTIAL
Basophils Absolute: 0.1 10*3/uL (ref 0.0–0.1)
Basophils Relative: 1 %
Eosinophils Absolute: 0.2 10*3/uL (ref 0.0–0.7)
Eosinophils Relative: 3 %
Lymphocytes Relative: 37 %
Lymphs Abs: 2.5 10*3/uL (ref 0.7–4.0)
Monocytes Absolute: 0.3 10*3/uL (ref 0.1–1.0)
Monocytes Relative: 5 %
Neutro Abs: 3.6 10*3/uL (ref 1.7–7.7)
Neutrophils Relative %: 54 %

## 2015-10-31 LAB — I-STAT TROPONIN, ED: Troponin i, poc: 0 ng/mL (ref 0.00–0.08)

## 2015-10-31 LAB — PROTIME-INR
INR: 1
Prothrombin Time: 13.2 seconds (ref 11.4–15.2)

## 2015-10-31 LAB — CBG MONITORING, ED: Glucose-Capillary: 149 mg/dL — ABNORMAL HIGH (ref 65–99)

## 2015-10-31 LAB — APTT: aPTT: 27 seconds (ref 24–36)

## 2015-10-31 MED ORDER — SODIUM CHLORIDE 0.9 % IV BOLUS (SEPSIS)
1000.0000 mL | Freq: Once | INTRAVENOUS | Status: AC
  Administered 2015-10-31: 1000 mL via INTRAVENOUS

## 2015-10-31 MED ORDER — METOCLOPRAMIDE HCL 5 MG/ML IJ SOLN
10.0000 mg | Freq: Once | INTRAMUSCULAR | Status: AC
  Administered 2015-10-31: 10 mg via INTRAVENOUS
  Filled 2015-10-31: qty 2

## 2015-10-31 MED ORDER — BUTALBITAL-APAP-CAFFEINE 50-325-40 MG PO TABS: 1.0000 | ORAL_TABLET | Freq: Four times a day (QID) | ORAL | 0 refills | Status: DC | PRN

## 2015-10-31 MED ORDER — DEXAMETHASONE SODIUM PHOSPHATE 10 MG/ML IJ SOLN
10.0000 mg | Freq: Once | INTRAMUSCULAR | Status: AC
  Administered 2015-10-31: 10 mg via INTRAVENOUS
  Filled 2015-10-31: qty 1

## 2015-10-31 MED ORDER — DIPHENHYDRAMINE HCL 50 MG/ML IJ SOLN
12.5000 mg | Freq: Once | INTRAMUSCULAR | Status: DC
  Filled 2015-10-31: qty 1

## 2015-10-31 NOTE — ED Notes (Signed)
Pt d/c home with son.

## 2015-10-31 NOTE — Discharge Instructions (Signed)
Your labs, CT scan, and MRI were unremarkable today. I gave you a prescription to take as needed for headaches. Follow up with your primary care provider and neurology as soon as possible.

## 2015-10-31 NOTE — ED Triage Notes (Signed)
Pt states she has pain in back of head/pressure in sides of neck and tingling in bilateral hands and feet. This has been going on for 3 days. Pt states she has been lightheaded and dizzy and nauseated.

## 2015-10-31 NOTE — ED Provider Notes (Signed)
[facility] DEPT Provider Note   CSN: [id number] Arrival date & time: 10/31/15  1643  History   Chief Complaint Chief Complaint  Patient presents with  . Headache   HPI   Gabriela MCCONAUGHY is an 50 y.o. female with history of arthritis, GERD, and HTN who presents to the ED for evaluation of headache. She states she was in her usual state of health until three days ago when she started developing a gradual onset dull, pressure-like headache in the back of her head. The pain radiates to her bilateral neck and jaw. She reports associated tingling in her hands and feet. The pain is intermittent. It is not associated with anything in particular. Denies photophobia or phonophobia. Denies blurred vision. She does endorse intermittently feeling dizzy and lightheaded when the pain comes on. She has not tried anything to alleviate her symptoms. Denies fever or chills. She states she does not typically get headaches.  Past Medical History:  Diagnosis Date  . Arthritis    neck & back   . Bronchitis   . External hemorrhoid   . GERD (gastroesophageal reflux disease)   . Hiatal hernia   . Hypertension   . Iron deficiency anemia    taking Fe TID, last hgb. 11.2, per pt.   . Schatzki's ring     Patient Active Problem List   Diagnosis Date Noted  . Abdominal bloating 10/30/2015  . Weight loss 10/30/2015  . Nausea 10/30/2015  . Belching 10/30/2015  . HTN (hypertension) 03/15/2013  . Microcytic anemia 03/15/2013    Past Surgical History:  Procedure Laterality Date  . BREAST LUMPECTOMY WITH RADIOACTIVE SEED LOCALIZATION Right 09/11/2015   Procedure: RIGHT BREAST LUMPECTOMY WITH RADIOACTIVE SEED LOCALIZATION;  Surgeon: Coralie Keens, MD;  Location: [facility];  Service: General;  Laterality: Right;  . CESAREAN SECTION     x 2  . CHOLECYSTECTOMY    . TUBAL LIGATION      OB History    No data available       Home Medications    Prior to Admission medications   Medication Sig Start  Date End Date Taking? Authorizing Provider  Cholecalciferol (DIALYVITE VITAMIN D 5000 PO) Take 1 capsule by mouth daily.   Yes Historical Provider, MD  [employer] MELATONIN 3 MG TABS Take 3 mg by mouth at bedtime as needed (for tinnitis).  10/09/15  Yes Historical Provider, MD  cyclobenzaprine (FLEXERIL) 10 MG tablet Take 10 mg by mouth 3 (three) times daily as needed for muscle spasms. 10/25/15  Yes Historical Provider, MD  ferrous sulfate 325 (65 FE) MG tablet Take 325 mg by mouth 3 (three) times daily. 07/14/15  Yes Historical Provider, MD  hyoscyamine (LEVSIN SL) 0.125 MG SL tablet Take 1-2 tablet by mouth every 4-6 hours as needed. Patient taking differently: Take 0.125 mg by mouth every 4 (four) hours as needed for cramping. Take 1-2 tablet by mouth every 4-6 hours as needed. 03/28/15  Yes Jerene Bears, MD  lisinopril-hydrochlorothiazide (PRINZIDE,ZESTORETIC) 20-12.5 MG tablet Take 1 tablet by mouth daily after lunch.  08/01/15  Yes Historical Provider, MD  Multiple Vitamin (MULTIVITAMIN WITH MINERALS) TABS tablet Take 1 tablet by mouth daily.   Yes Historical Provider, MD  ondansetron (ZOFRAN) 4 MG tablet Take 1 tablet (4 mg total) by mouth every 8 (eight) hours as needed for nausea or vomiting. 10/30/15  Yes Jessica D Zehr, PA-C  pantoprazole (PROTONIX) 40 MG tablet TAKE 1 TABLET (40 MG TOTAL) BY MOUTH 2 (TWO) TIMES  DAILY BEFORE A MEAL. 10/24/15  Yes Jerene Bears, MD  Vitamin D, Ergocalciferol, (DRISDOL) 50000 units CAPS capsule Take 50,000 Units by mouth every Thursday.    Yes Historical Provider, MD  sucralfate (CARAFATE) 1 GM/10ML suspension Take 10 mLs (1 g total) by mouth 4 (four) times daily -  with meals and at bedtime. 10/30/15   Loralie Champagne, PA-C    Family History Family History  Problem Relation Age of Onset  . Hypertension Mother   . Diabetes Mother   . Diabetes Father   . Diabetes Brother     Social History Social History  Substance Use Topics  . Smoking status: Never Smoker  .  Smokeless tobacco: Never Used  . Alcohol use No     Allergies   Tramadol   Review of Systems Review of Systems 10 Systems reviewed and are negative for acute change except as noted in the HPI.  Physical Exam Updated Vital Signs BP 132/62   Pulse (!) 58   Temp 98.1 F (36.7 C) (Oral)   Resp 16   Ht 5\' 5"  (1.651 m)   Wt 97.1 kg   SpO2 100%   BMI 35.61 kg/m   Physical Exam  Constitutional: She is oriented to person, place, and time.  HENT:  Right Ear: External ear normal.  Left Ear: External ear normal.  Nose: Nose normal.  Mouth/Throat: Oropharynx is clear and moist. No oropharyngeal exudate.  Eyes: Conjunctivae and EOM are normal. Pupils are equal, round, and reactive to light.  No nystagmus  Neck: Normal range of motion. Neck supple.  No meningismus  Cardiovascular: Normal rate, regular rhythm, normal heart sounds and intact distal pulses.   Pulmonary/Chest: Effort normal and breath sounds normal. No respiratory distress. She has no wheezes.  Abdominal: Soft. Bowel sounds are normal. She exhibits no distension. There is no tenderness. There is no rebound and no guarding.  Musculoskeletal: She exhibits no edema.  Lymphadenopathy:    She has no cervical adenopathy.  Neurological: She is alert and oriented to person, place, and time. No cranial nerve deficit.  5/5 strength BLE and BUE Normal finger to nose  No pronator drift Steady gait  Skin: Skin is warm and dry.  Psychiatric: She has a normal mood and affect.  Nursing note and vitals reviewed.    ED Treatments / Results  Labs (all labs ordered are listed, but only abnormal results are displayed) Labs Reviewed  COMPREHENSIVE METABOLIC PANEL - Abnormal; Notable for the following:       Result Value   Glucose, Bld 159 (*)    Creatinine, Ser 1.43 (*)    GFR calc non Af Amer 42 (*)    GFR calc Af Amer 49 (*)    All other components within normal limits  CBG MONITORING, ED - Abnormal; Notable for the  following:    Glucose-Capillary 149 (*)    All other components within normal limits  I-STAT CHEM 8, ED - Abnormal; Notable for the following:    Creatinine, Ser 1.40 (*)    Glucose, Bld 154 (*)    Calcium, Ion 1.14 (*)    All other components within normal limits  PROTIME-INR  APTT  CBC  DIFFERENTIAL  I-STAT TROPOININ, ED    EKG  EKG Interpretation  Date/Time:  Tuesday October 31 2015 17:08:24 EDT Ventricular Rate:  69 PR Interval:  144 QRS Duration: 86 QT Interval:  396 QTC Calculation: 424 R Axis:   27 Text Interpretation:  Normal  sinus rhythm Cannot rule out Inferior infarct , age undetermined Abnormal ECG No significant change since last tracing Confirmed by [facility]  MD, MARTHA [phone]) on 10/31/2015 9:15:03 PM       Radiology Ct Head Wo Contrast  Result Date: 10/31/2015 CLINICAL DATA:  Tightness in back and numbness in lips for 3 days EXAM: CT HEAD WITHOUT CONTRAST TECHNIQUE: Contiguous axial images were obtained from the base of the skull through the vertex without intravenous contrast. COMPARISON:  None. FINDINGS: Brain: No evidence of acute infarction, hemorrhage, hydrocephalus, extra-axial collection or mass lesion/mass effect. Vascular: No hyperdense vessel or unexpected calcification. Skull: Normal. Negative for fracture or focal lesion. Sinuses/Orbits: No acute finding. Other: None. IMPRESSION: Normal. Electronically Signed   By: Dorise Bullion III M.D   On: 10/31/2015 17:43   Mr Brain Wo Contrast  Result Date: 10/31/2015 CLINICAL DATA:  50 y/o F; pain in back of head and pressure on sides of neck with tingling in bilateral hands and feet for 3 days. Additional symptoms of lightheadedness and dizziness. EXAM: MRI HEAD WITHOUT CONTRAST TECHNIQUE: Axial DWI sequences obtained. Patient was unable to continue due to claustrophobia. COMPARISON:  10/31/2015 CT head. FINDINGS: No diffusion restriction to suggest acute or early subacute infarct. No hydrocephalus. No large  gross hemorrhage or focal mass effect on the B0 sequence. IMPRESSION: No diffusion signal abnormality to suggest acute or early subacute infarct. Electronically Signed   By: Kristine Garbe M.D.   On: 10/31/2015 21:46    Procedures Procedures (including critical care time)  Medications Ordered in ED Medications  diphenhydrAMINE (BENADRYL) injection 12.5 mg (12.5 mg Intravenous Refused 10/31/15 2207)  sodium chloride 0.9 % bolus 1,000 mL (0 mLs Intravenous Stopped 10/31/15 2319)  metoCLOPramide (REGLAN) injection 10 mg (10 mg Intravenous Given 10/31/15 2041)  dexamethasone (DECADRON) injection 10 mg (10 mg Intravenous Given 10/31/15 2041)     Initial Impression / Assessment and Plan / ED Course  I have reviewed the triage vital signs and the nursing notes.  Pertinent labs & imaging results that were available during my care of the patient were reviewed by me and considered in my medical decision making (see chart for details).  Clinical Course    Neuro exam intact. Headache resolved with cocktail. Initial labs and CT head drawn in triage were unrevealing. However, in this 50 y.o. female with no history of headaches reporting posterior headache with associated paresthesias and dizziness I did order an MRI to r/o stroke and it was negative. She is stable for discharge. rx given for fioricet as needed. Encouraged f/u with neurology. ER return precautions given.  Final Clinical Impressions(s) / ED Diagnoses   Final diagnoses:  Acute nonintractable headache, unspecified headache type    New Prescriptions Discharge Medication List as of 10/31/2015 10:55 PM    START taking these medications   Details  butalbital-acetaminophen-caffeine (FIORICET, ESGIC) 50-325-40 MG tablet Take 1-2 tablets by mouth every 6 (six) hours as needed for headache., Starting Tue 10/31/2015, Until Wed 10/30/2016, Print         Anne Ng, PA-C 11/01/15 0002    Alfonzo Beers, MD 11/03/15 [phone]

## 2015-11-01 ENCOUNTER — Ambulatory Visit (INDEPENDENT_AMBULATORY_CARE_PROVIDER_SITE_OTHER)
Admission: RE | Admit: 2015-11-01 | Discharge: 2015-11-01 | Disposition: A | Discharge status: Home / Self Care | Payer: BLUE CROSS/BLUE SHIELD | Source: Ambulatory Visit | Attending: Gastroenterology | Admitting: Gastroenterology

## 2015-11-01 DIAGNOSIS — R110 Nausea: Secondary | ICD-10-CM

## 2015-11-01 DIAGNOSIS — R634 Abnormal weight loss: Secondary | ICD-10-CM

## 2015-11-01 DIAGNOSIS — R140 Abdominal distension (gaseous): Secondary | ICD-10-CM

## 2015-11-01 MED ORDER — IOPAMIDOL (ISOVUE-300) INJECTION 61%
100.0000 mL | Freq: Once | INTRAVENOUS | Status: AC | PRN
  Administered 2015-11-01: 100 mL via INTRAVENOUS

## 2015-11-03 ENCOUNTER — Telehealth: Payer: Self-pay | Admitting: Gastroenterology

## 2015-11-03 NOTE — Telephone Encounter (Signed)
Pt aware the results are not available at this time.  We will call as soon as reviewed

## 2015-11-04 ENCOUNTER — Emergency Department (HOSPITAL_COMMUNITY)
Admission: EM | Admit: 2015-11-04 | Discharge: 2015-11-04 | Disposition: A | Discharge status: Home / Self Care | Payer: BLUE CROSS/BLUE SHIELD | Attending: Emergency Medicine | Admitting: Emergency Medicine

## 2015-11-04 ENCOUNTER — Emergency Department (HOSPITAL_COMMUNITY): Payer: BLUE CROSS/BLUE SHIELD

## 2015-11-04 ENCOUNTER — Encounter (HOSPITAL_COMMUNITY): Payer: Self-pay

## 2015-11-04 DIAGNOSIS — Z79899 Other long term (current) drug therapy: Secondary | ICD-10-CM | POA: Insufficient documentation

## 2015-11-04 DIAGNOSIS — R200 Anesthesia of skin: Secondary | ICD-10-CM | POA: Diagnosis not present

## 2015-11-04 DIAGNOSIS — I10 Essential (primary) hypertension: Secondary | ICD-10-CM | POA: Diagnosis not present

## 2015-11-04 DIAGNOSIS — R202 Paresthesia of skin: Secondary | ICD-10-CM | POA: Diagnosis not present

## 2015-11-04 LAB — BASIC METABOLIC PANEL
Anion gap: 7 (ref 5–15)
BUN: 9 mg/dL (ref 6–20)
CO2: 27 mmol/L (ref 22–32)
Calcium: 9.5 mg/dL (ref 8.9–10.3)
Chloride: 107 mmol/L (ref 101–111)
Creatinine, Ser: 1.37 mg/dL — ABNORMAL HIGH (ref 0.44–1.00)
GFR calc Af Amer: 52 mL/min — ABNORMAL LOW (ref >60)
GFR calc non Af Amer: 44 mL/min — ABNORMAL LOW (ref >60)
Glucose, Bld: 122 mg/dL — ABNORMAL HIGH (ref 65–99)
Potassium: 3.7 mmol/L (ref 3.5–5.1)
Sodium: 141 mmol/L (ref 135–145)

## 2015-11-04 LAB — I-STAT TROPONIN, ED: Troponin i, poc: 0 ng/mL (ref 0.00–0.08)

## 2015-11-04 LAB — CBC
HCT: 45.8 % (ref 36.0–46.0)
Hemoglobin: 14.2 g/dL (ref 12.0–15.0)
MCH: 26.9 pg (ref 26.0–34.0)
MCHC: 31 g/dL (ref 30.0–36.0)
MCV: 86.7 fL (ref 78.0–100.0)
Platelets: 205 10*3/uL (ref 150–400)
RBC: 5.28 MIL/uL — ABNORMAL HIGH (ref 3.87–5.11)
RDW: 14.4 % (ref 11.5–15.5)
WBC: 5 10*3/uL (ref 4.0–10.5)

## 2015-11-04 MED ORDER — ONDANSETRON 4 MG PO TBDP: 4.0000 mg | ORAL_TABLET | Freq: Three times a day (TID) | ORAL | 0 refills | Status: DC | PRN

## 2015-11-04 NOTE — ED Notes (Signed)
Pt refused wheelchair. Verbalized understanding of Zofran use.

## 2015-11-04 NOTE — ED Provider Notes (Signed)
[facility] DEPT Provider Note   CSN: [id number] Arrival date & time: 11/04/15  1235     History   Chief Complaint Chief Complaint  Patient presents with  . Palpitations  . Numbness    HPI Gabriela Lowe is a 50 y.o. female.  HPI    Gabriela Lowe is a 50 y.o. female, with a history of Iron deficiency anemia and hypertension, presenting to the ED with tingling to her hands and feet, intermittent for the past week. Patient states she has not noticed a pattern as to when the tingling arises. Denies chest pain, shortness of breath, vomiting/diarrhea, weakness, or any other complaints.    Past Medical History:  Diagnosis Date  . Arthritis    neck & back   . Bronchitis   . External hemorrhoid   . GERD (gastroesophageal reflux disease)   . Hiatal hernia   . Hypertension   . Iron deficiency anemia    taking Fe TID, last hgb. 11.2, per pt.   . Schatzki's ring     Patient Active Problem List   Diagnosis Date Noted  . Abdominal bloating 10/30/2015  . Weight loss 10/30/2015  . Nausea 10/30/2015  . Belching 10/30/2015  . HTN (hypertension) 03/15/2013  . Microcytic anemia 03/15/2013    Past Surgical History:  Procedure Laterality Date  . BREAST LUMPECTOMY WITH RADIOACTIVE SEED LOCALIZATION Right 09/11/2015   Procedure: RIGHT BREAST LUMPECTOMY WITH RADIOACTIVE SEED LOCALIZATION;  Surgeon: Coralie Keens, MD;  Location: [facility];  Service: General;  Laterality: Right;  . CESAREAN SECTION     x 2  . CHOLECYSTECTOMY    . TUBAL LIGATION      OB History    No data available       Home Medications    Prior to Admission medications   Medication Sig Start Date End Date Taking? Authorizing Provider  butalbital-acetaminophen-caffeine (FIORICET, ESGIC) [phone] MG tablet Take 1-2 tablets by mouth every 6 (six) hours as needed for headache. 10/31/15 10/30/16  Olivia Canter Sam, PA-C  Cholecalciferol (DIALYVITE VITAMIN D 5000 PO) Take 1 capsule by mouth daily.     Historical Provider, MD  [facility] MELATONIN 3 MG TABS Take 3 mg by mouth at bedtime as needed (for tinnitis).  10/09/15   Historical Provider, MD  cyclobenzaprine (FLEXERIL) 10 MG tablet Take 10 mg by mouth 3 (three) times daily as needed for muscle spasms. 10/25/15   Historical Provider, MD  ferrous sulfate 325 (65 FE) MG tablet Take 325 mg by mouth 3 (three) times daily. 07/14/15   Historical Provider, MD  hyoscyamine (LEVSIN SL) 0.125 MG SL tablet Take 1-2 tablet by mouth every 4-6 hours as needed. Patient taking differently: Take 0.125 mg by mouth every 4 (four) hours as needed for cramping. Take 1-2 tablet by mouth every 4-6 hours as needed. 03/28/15   Jerene Bears, MD  lisinopril-hydrochlorothiazide (PRINZIDE,ZESTORETIC) 20-12.5 MG tablet Take 1 tablet by mouth daily after lunch.  08/01/15   Historical Provider, MD  Multiple Vitamin (MULTIVITAMIN WITH MINERALS) TABS tablet Take 1 tablet by mouth daily.    Historical Provider, MD  ondansetron (ZOFRAN) 4 MG tablet Take 1 tablet (4 mg total) by mouth every 8 (eight) hours as needed for nausea or vomiting. 10/30/15   Laban Emperor Zehr, PA-C  pantoprazole (PROTONIX) 40 MG tablet TAKE 1 TABLET (40 MG TOTAL) BY MOUTH 2 (TWO) TIMES DAILY BEFORE A MEAL. 10/24/15   Jerene Bears, MD  sucralfate (CARAFATE) 1 GM/10ML suspension Take 10  mLs (1 g total) by mouth 4 (four) times daily -  with meals and at bedtime. 10/30/15   Laban Emperor Zehr, PA-C  Vitamin D, Ergocalciferol, (DRISDOL) 50000 units CAPS capsule Take 50,000 Units by mouth every Thursday.     Historical Provider, MD    Family History Family History  Problem Relation Age of Onset  . Hypertension Mother   . Diabetes Mother   . Diabetes Father   . Diabetes Brother     Social History Social History  Substance Use Topics  . Smoking status: Never Smoker  . Smokeless tobacco: Never Used  . Alcohol use No     Allergies   Tramadol   Review of Systems Review of Systems  Constitutional: Negative for chills  and fever.  Gastrointestinal: Positive for nausea. Negative for vomiting.  Musculoskeletal: Negative for myalgias.  Neurological: Negative for dizziness, weakness, light-headedness, numbness and headaches.       Tingling in the hands and feet  All other systems reviewed and are negative.    Physical Exam Updated Vital Signs BP 130/67 (BP Location: Left Arm)   Pulse 62   Temp 98.6 F (37 C) (Oral)   Resp 18   Ht 5\' 5"  (1.651 m)   Wt 97.1 kg   SpO2 100%   BMI 35.61 kg/m   Physical Exam  Constitutional: She is oriented to person, place, and time. She appears well-developed and well-nourished. No distress.  HENT:  Head: Normocephalic and atraumatic.  Eyes: Conjunctivae and EOM are normal. Pupils are equal, round, and reactive to light.  Neck: Neck supple.  Cardiovascular: Normal rate, regular rhythm, normal heart sounds and intact distal pulses.   Pulmonary/Chest: Effort normal and breath sounds normal. No respiratory distress.  Abdominal: Soft. There is no tenderness. There is no guarding.  Musculoskeletal: She exhibits no edema or tenderness.  Full ROM in all extremities and spine. No midline spinal tenderness.   Lymphadenopathy:    She has no cervical adenopathy.  Neurological: She is alert and oriented to person, place, and time. She has normal reflexes.  Decreased sensation to the left small toe. No other sensory deficits. Strength 5/5 in all extremities. No gait disturbance. Coordination intact. Cranial nerves III-XII grossly intact. No facial droop.   Skin: Skin is warm and dry. Capillary refill takes less than 2 seconds. She is not diaphoretic.  Psychiatric: She has a normal mood and affect. Her behavior is normal.  Nursing note and vitals reviewed.    ED Treatments / Results  Labs (all labs ordered are listed, but only abnormal results are displayed) Labs Reviewed  BASIC METABOLIC PANEL - Abnormal; Notable for the following:       Result Value   Glucose, Bld 122  (*)    Creatinine, Ser 1.37 (*)    GFR calc non Af Amer 44 (*)    GFR calc Af Amer 52 (*)    All other components within normal limits  CBC - Abnormal; Notable for the following:    RBC 5.28 (*)    All other components within normal limits  I-STAT TROPOININ, ED    EKG  EKG Interpretation None       Radiology Dg Chest 2 View  Result Date: 11/04/2015 CLINICAL DATA:  Patient with numbness in the hands and feet for 1 week. EXAM: CHEST  2 VIEW COMPARISON:  Chest radiograph 07/14/2015 FINDINGS: Normal cardiac and mediastinal contours. No consolidative pulmonary opacities. No pleural effusion or pneumothorax. Regional skeleton is unremarkable. Cholecystectomy  clips. IMPRESSION: No active cardiopulmonary disease. Electronically Signed   By: Lovey Newcomer M.D.   On: 11/04/2015 14:17    Procedures Procedures (including critical care time)  Medications Ordered in ED Medications - No data to display   Initial Impression / Assessment and Plan / ED Course  I have reviewed the triage vital signs and the nursing notes.  Pertinent labs & imaging results that were available during my care of the patient were reviewed by me and considered in my medical decision making (see chart for details).  Clinical Course    Patient presents with complaint of tingling in the hands and feet, intermittent for the last week.  Tingling is intermittent and is equal bilaterally. No weakness accompanies this feeling of tingling. No significant abnormality on the patient's exam. Exam not concerning for CVA. No significant abnormalities on the patient's labs and certainly no abnormalities that would explain patient's symptoms. Patient will need to follow up with her PCP for further testing.   Vitals:   11/04/15 1244 11/04/15 1440 11/04/15 1516  BP: 144/72 130/67 137/65  Pulse: 74 62 60  Resp: 18 18 13   Temp: 98.4 F (36.9 C) 98.6 F (37 C)   TempSrc: Oral Oral   SpO2: 99% 100% 100%  Weight: 97.1 kg      Height: 5\' 5"  (1.651 m)       Final Clinical Impressions(s) / ED Diagnoses   Final diagnoses:  Tingling in extremities    New Prescriptions New Prescriptions   No medications on file     Lorayne Bender, Hershal Coria 11/04/15 1604    Leonard Schwartz, MD 11/05/15 [phone]

## 2015-11-04 NOTE — Discharge Instructions (Addendum)
You have been seen today for tingling in the extremities. Your imaging and lab tests showed no acute abnormalities, other than signs of some possible dehydration. Follow up with PCP as soon as possible for any continued testing. Return to ED should symptoms worsen. Zofran as needed for nausea or vomiting.

## 2015-11-04 NOTE — ED Triage Notes (Addendum)
Pt reports having intermittent palpitations, worse when she lays down to rest. She also reports tingling in the hands and feet bilaterally. She denies chest pain but does report feeling light headed.

## 2015-11-07 ENCOUNTER — Other Ambulatory Visit: Payer: Self-pay | Admitting: Physician Assistant

## 2015-11-07 DIAGNOSIS — R202 Paresthesia of skin: Secondary | ICD-10-CM

## 2015-11-10 DIAGNOSIS — M542 Cervicalgia: Secondary | ICD-10-CM | POA: Diagnosis not present

## 2015-11-10 DIAGNOSIS — M26623 Arthralgia of bilateral temporomandibular joint: Secondary | ICD-10-CM | POA: Diagnosis not present

## 2015-11-14 ENCOUNTER — Ambulatory Visit
Admission: RE | Admit: 2015-11-14 | Discharge: 2015-11-14 | Disposition: A | Discharge status: Home / Self Care | Payer: BLUE CROSS/BLUE SHIELD | Source: Ambulatory Visit | Attending: Physician Assistant | Admitting: Physician Assistant

## 2015-11-14 DIAGNOSIS — M5126 Other intervertebral disc displacement, lumbar region: Secondary | ICD-10-CM | POA: Diagnosis not present

## 2015-11-14 DIAGNOSIS — R202 Paresthesia of skin: Secondary | ICD-10-CM

## 2015-11-14 MED ORDER — GADOBENATE DIMEGLUMINE 529 MG/ML IV SOLN
20.0000 mL | Freq: Once | INTRAVENOUS | Status: AC | PRN
  Administered 2015-11-14: 20 mL via INTRAVENOUS

## 2015-11-20 ENCOUNTER — Other Ambulatory Visit: Payer: Self-pay | Admitting: Internal Medicine

## 2015-11-27 ENCOUNTER — Encounter: Payer: Self-pay | Admitting: Neurology

## 2015-11-27 ENCOUNTER — Ambulatory Visit (INDEPENDENT_AMBULATORY_CARE_PROVIDER_SITE_OTHER): Payer: BLUE CROSS/BLUE SHIELD | Admitting: Neurology

## 2015-11-27 DIAGNOSIS — R202 Paresthesia of skin: Secondary | ICD-10-CM | POA: Insufficient documentation

## 2015-11-27 HISTORY — DX: Paresthesia of skin: R20.2

## 2015-11-27 NOTE — Progress Notes (Signed)
PATIENT: Gabriela Lowe DOB: 02-05-1966  Chief Complaint  Patient presents with  . Numbness    She is here with her partner, [name].  She has been experiencing numbness, tingling and burning in her hands, feet, legs and right side of her face.  Symptoms have been present about three weeks.  Marland Kitchen Headache    Reports getting headaches 1-2 times weekly.  Headaches are often associated with dizziness.     HISTORICAL  Gabriela Lowe is a 50 years old left-handed female, accompanied by her partner [name], seen in refer by emergency room for evaluation of paresthesia involving right side of her body, bilateral hands, feet, her primary care physician is [place]  Maude Leriche, initial evaluation was November 27 2015  She had a history of iron deficiency anemia on iron supplement, hypertension, she complains of numbness tingling involving bilateral hands and feet since September 2017, she works at [employer], standing on her feet up to 8 hours each day, she denies gait abnormality, no bowel and bladder incontinence,  But since September 2017, each morning she woke up and noticed bilateral hands numbness tingling, at nighttime, she often feels bilateral feet numbness tingling, intermittent, no significant worsening,  I reviewed and summarized her most recent ER visit, October 31 2015 for chest pain, November 04 2015 for paresthesia involving 4 extremities  Laboratory evaluation in September 2017, negative troponin, normal CBC with hemoglobin of 14.2, BMP showed elevated creatinine 1.37, with GFR 52, glucose was 122, HIV was negative in February 2017  I personally reviewed MRI lumbar in September 2017, mild degenerative disc disease most severe at L4-5, L5-S1, no significant canal or foraminal stenosis,   CAT scan of the brain showed no significant abnormality, MRI DWI was negative no evidence of acute stroke   REVIEW OF SYSTEMS: Full 14 system review of systems performed and notable only  for restless leg, sleepiness, headaches, numbness, weakness, dizziness, feeling hot, flushing, joint pain, cramps, achy muscles, skin sensitivity, eye pain, weight loss, ringing ears  ALLERGIES: Allergies  Allergen Reactions  . Tramadol Nausea Only and Other (See Comments)    Pt states makes her groggy and nauseous    HOME MEDICATIONS: Current Outpatient Prescriptions  Medication Sig Dispense Refill  . butalbital-acetaminophen-caffeine (FIORICET, ESGIC) 50-325-40 MG tablet Take 1-2 tablets by mouth every 6 (six) hours as needed for headache. 20 tablet 0  . Cholecalciferol (DIALYVITE VITAMIN D 5000 PO) Take 1 capsule by mouth daily.    . Cholecalciferol (VITAMIN D3) 5000 units CAPS Take by mouth daily.    . [facility] MELATONIN 3 MG TABS Take 3 mg by mouth at bedtime as needed (for tinnitis).   11  . cyclobenzaprine (FLEXERIL) 10 MG tablet Take 10 mg by mouth 3 (three) times daily as needed for muscle spasms.    . ferrous sulfate 325 (65 FE) MG tablet Take 325 mg by mouth 3 (three) times daily.    . hyoscyamine (LEVSIN SL) 0.125 MG SL tablet Take 1-2 tablet by mouth every 4-6 hours as needed. (Patient taking differently: Take 0.125 mg by mouth every 4 (four) hours as needed for cramping. Take 1-2 tablet by mouth every 4-6 hours as needed.) 50 tablet 2  . lisinopril-hydrochlorothiazide (PRINZIDE,ZESTORETIC) 20-12.5 MG tablet Take 1 tablet by mouth daily after lunch.   0  . Multiple Vitamin (MULTIVITAMIN WITH MINERALS) TABS tablet Take 1 tablet by mouth daily.    . ondansetron (ZOFRAN ODT) 4 MG disintegrating tablet Take 1 tablet (4  mg total) by mouth every 8 (eight) hours as needed for nausea or vomiting. 20 tablet 0  . pantoprazole (PROTONIX) 40 MG tablet TAKE 1 TABLET BY MOUTH TWICE A DAY BEFORE MEALS 60 tablet 3  . sucralfate (CARAFATE) 1 GM/10ML suspension Take 10 mLs (1 g total) by mouth 4 (four) times daily -  with meals and at bedtime. 420 mL 1  . Vitamin D, Ergocalciferol, (DRISDOL) 50000  units CAPS capsule Take 50,000 Units by mouth every Thursday.      No current facility-administered medications for this visit.     PAST MEDICAL HISTORY: Past Medical History:  Diagnosis Date  . Arthritis    neck & back   . Bronchitis   . External hemorrhoid   . GERD (gastroesophageal reflux disease)   . Headache   . Hiatal hernia   . Hypertension   . Iron deficiency anemia    taking Fe TID, last hgb. 11.2, per pt.   . Numbness and tingling   . Schatzki's ring     PAST SURGICAL HISTORY: Past Surgical History:  Procedure Laterality Date  . BREAST LUMPECTOMY WITH RADIOACTIVE SEED LOCALIZATION Right 09/11/2015   Procedure: RIGHT BREAST LUMPECTOMY WITH RADIOACTIVE SEED LOCALIZATION;  Surgeon: Coralie Keens, MD;  Location: [facility];  Service: General;  Laterality: Right;  . CESAREAN SECTION     x 2  . CHOLECYSTECTOMY    . TUBAL LIGATION      FAMILY HISTORY: Family History  Problem Relation Age of Onset  . Hypertension Mother   . Diabetes Mother   . Diabetes Father   . Congestive Heart Failure Father   . Diabetes Brother     SOCIAL HISTORY:  Social History   Social History  . Marital status: Married    Spouse name: N/A  . Number of children: 2  . Years of education: 3 years college   Occupational History  . Sales Associate    Social History Main Topics  . Smoking status: Never Smoker  . Smokeless tobacco: Never Used  . Alcohol use No  . Drug use: No  . Sexual activity: Not on file   Other Topics Concern  . Not on file   Social History Narrative   Lives at home with her son and her partner, [name].   Right-handed.   No caffeine use.     PHYSICAL EXAM   Vitals:   11/27/15 1602  BP: (!) 108/58  Pulse: 62  Weight: 211 lb 8 oz (95.9 kg)  Height: 5\' 5"  (1.651 m)    Not recorded      Body mass index is 35.2 kg/m.  PHYSICAL EXAMNIATION:  Gen: NAD, conversant, well nourised, obese, well groomed                     Cardiovascular: Regular  rate rhythm, no peripheral edema, warm, nontender. Eyes: Conjunctivae clear without exudates or hemorrhage Neck: Supple, no carotid bruise. Pulmonary: Clear to auscultation bilaterally   NEUROLOGICAL EXAM:  MENTAL STATUS: Speech:    Speech is normal; fluent and spontaneous with normal comprehension.  Cognition:     Orientation to time, place and person     Normal recent and remote memory     Normal Attention span and concentration     Normal Language, naming, repeating,spontaneous speech     Fund of knowledge   CRANIAL NERVES: CN II: Visual fields are full to confrontation. Fundoscopic exam is normal with sharp discs and no vascular changes. Pupils  are round equal and briskly reactive to light. CN III, IV, VI: extraocular movement are normal. No ptosis. CN V: Facial sensation is intact to pinprick in all 3 divisions bilaterally. Corneal responses are intact.  CN VII: Face is symmetric with normal eye closure and smile. CN VIII: Hearing is normal to rubbing fingers CN IX, X: Palate elevates symmetrically. Phonation is normal. CN XI: Head turning and shoulder shrug are intact CN XII: Tongue is midline with normal movements and no atrophy.  MOTOR: There is no pronator drift of out-stretched arms. Muscle bulk and tone are normal. Muscle strength is normal.  REFLEXES: Reflexes are 2+ and symmetric at the biceps, triceps, knees, and ankles. Plantar responses are flexor.  SENSORY: Intact to light touch, pinprick, positional sensation and vibratory sensation are intact in fingers and toes.  COORDINATION: Rapid alternating movements and fine finger movements are intact. There is no dysmetria on finger-to-nose and heel-knee-shin.    GAIT/STANCE: Posture is normal. Gait is steady with normal steps, base, arm swing, and turning. Heel and toe walking are normal. Tandem gait is normal.  Romberg is absent.   DIAGNOSTIC DATA (LABS, IMAGING, TESTING) - I reviewed patient records, labs,  notes, testing and imaging myself where available.   ASSESSMENT AND PLAN  Gabriela Lowe is a 50 y.o. female   Intermittent arm and leg paresthesia Differentiation diagnosis includes peripheral neuropathy, carpal tunnel syndromes, EMG nerve conduction study Laboratory evaluation including B12, TSH She does not want any neuropathic pain medication at this point, her symptoms does not limit her function,   Marcial Pacas, M.D. Ph.D.  [facility] [address] [place], [place] [address] Ph: [phone] Fax: [phone]  CC: Maude Leriche, PA-C

## 2015-11-28 DIAGNOSIS — N926 Irregular menstruation, unspecified: Secondary | ICD-10-CM | POA: Diagnosis not present

## 2015-11-28 LAB — VITAMIN B12: Vitamin B-12: 401 pg/mL (ref 211–946)

## 2015-11-28 LAB — TSH: TSH: 0.616 u[IU]/mL (ref 0.450–4.500)

## 2015-11-28 LAB — RPR: RPR Ser Ql: NONREACTIVE

## 2015-11-29 ENCOUNTER — Other Ambulatory Visit (INDEPENDENT_AMBULATORY_CARE_PROVIDER_SITE_OTHER): Payer: BLUE CROSS/BLUE SHIELD

## 2015-11-29 ENCOUNTER — Ambulatory Visit (INDEPENDENT_AMBULATORY_CARE_PROVIDER_SITE_OTHER): Payer: BLUE CROSS/BLUE SHIELD | Admitting: Gastroenterology

## 2015-11-29 ENCOUNTER — Encounter: Payer: Self-pay | Admitting: Gastroenterology

## 2015-11-29 VITALS — BP 130/72 | HR 78 | Ht 65.0 in | Wt 214.0 lb

## 2015-11-29 DIAGNOSIS — K219 Gastro-esophageal reflux disease without esophagitis: Secondary | ICD-10-CM | POA: Diagnosis not present

## 2015-11-29 DIAGNOSIS — R110 Nausea: Secondary | ICD-10-CM | POA: Diagnosis not present

## 2015-11-29 DIAGNOSIS — R140 Abdominal distension (gaseous): Secondary | ICD-10-CM | POA: Diagnosis not present

## 2015-11-29 HISTORY — DX: Gastro-esophageal reflux disease without esophagitis: K21.9

## 2015-11-29 LAB — C-REACTIVE PROTEIN: CRP: 0.7 mg/dL (ref 0.5–20.0)

## 2015-11-29 MED ORDER — ONDANSETRON 4 MG PO TBDP: 4.0000 mg | ORAL_TABLET | Freq: Three times a day (TID) | ORAL | 0 refills | Status: DC | PRN

## 2015-11-29 NOTE — Progress Notes (Addendum)
     11/29/2015 Gabriela Lowe [id number] 1965/11/01   History of Present Illness:  Patient here for follow-up of nausea, belching, abdominal bloating, epigastric pain, and GERD.  Please see my note from 10/30/2015 for details regarding her history.  Since her last visit she underwent repeat CT scan, which was unremarkable.  She remains on her pantoprazole 40 mg twice daily and Zofran as needed. She says that she was not able to get the Carafate because it was very expensive.  She says that she is still having the same symptoms and they seem to be getting worse. Complains of worsening reflux as well stating that she has to sleep upright in a chair despite pantoprazole 40 mg twice daily. Also having a lot of nausea for which she is taking the Zofran. No vomiting.  Current Medications, Allergies, Past Medical History, Past Surgical History, Family History and Social History were reviewed in [employer] record.   Physical Exam: BP 130/72 (BP Location: Right Arm, Patient Position: Sitting, Cuff Size: Normal)   Pulse 78   Ht 5\' 5"  (1.651 m)   Wt 214 lb (97.1 kg)   BMI 35.61 kg/m  General: Well developed black female in no acute distress Head: Normocephalic and atraumatic Eyes:  Sclerae anicteric, conjunctiva pink  Ears: Normal auditory acuity Lungs: Clear throughout to auscultation Heart: Regular rate and rhythm Abdomen: Soft, non-distended.  Normal bowel sounds.  Mild epigastric TTP. Musculoskeletal: Symmetrical with no gross deformities  Extremities: No edema  Neurological: Alert oriented x 4, grossly non-focal Psychological:  Alert and cooperative. Normal mood and affect  Assessment and Recommendations: -50 year old female with recurrent nausea as well belching, abdominal bloating, and epigastric abdominal pain, and reflux: Previous CT scan suggested enteritis but colonoscopy, EGD, and capsule study were unremarkable. Last CRP elevated earlier this year. CT scan  was repeated last month and was unremarkable.  Is on twice daily PPI, which initially seemed to be helping her reflux but all symptoms now recurrent.  Discussed with Dr. Hilarie Fredrickson. We will order a gastric emptying scan to rule out gastroparesis. She will continue her pantoprazole 40 mg twice daily for now as well as her Zofran as needed, which we will refill today. We will recheck a CRP today as well. Pending results of the above, may need to consider repeat EGD then breath test for SIBO then trial of tricyclic antidepressant.  Addendum: Reviewed and agree with initial management. Gabriela Bears, MD

## 2015-11-29 NOTE — Patient Instructions (Addendum)
You have been scheduled for a gastric emptying scan at [facility] on 12/12/15 at 7:30 am. Please arrive at least 15 minutes prior to your appointment for registration. Please make certain not to have anything to eat or drink after midnight the night before your test. Hold all stomach medications (ex: Zofran, phenergan, Reglan) 48 hours prior to your test. If you need to reschedule your appointment, please contact radiology scheduling at [phone]. _____________________________________________________________________ A gastric-emptying study measures how long it takes for food to move through your stomach. There are several ways to measure stomach emptying. In the most common test, you eat food that contains a small amount of radioactive material. A scanner that detects the movement of the radioactive material is placed over your abdomen to monitor the rate at which food leaves your stomach. This test normally takes about 4 hours to complete. _____________________________________________________________________  We have given you a prescription for Zofran.

## 2015-11-30 DIAGNOSIS — R202 Paresthesia of skin: Secondary | ICD-10-CM | POA: Diagnosis not present

## 2015-11-30 DIAGNOSIS — Z114 Encounter for screening for human immunodeficiency virus [HIV]: Secondary | ICD-10-CM | POA: Diagnosis not present

## 2015-12-08 DIAGNOSIS — E119 Type 2 diabetes mellitus without complications: Secondary | ICD-10-CM | POA: Diagnosis not present

## 2015-12-12 ENCOUNTER — Encounter (HOSPITAL_COMMUNITY)
Admission: RE | Admit: 2015-12-12 | Discharge: 2015-12-12 | Disposition: A | Discharge status: Home / Self Care | Payer: BLUE CROSS/BLUE SHIELD | Source: Ambulatory Visit | Attending: Gastroenterology | Admitting: Gastroenterology

## 2015-12-12 DIAGNOSIS — R140 Abdominal distension (gaseous): Secondary | ICD-10-CM | POA: Insufficient documentation

## 2015-12-12 DIAGNOSIS — R110 Nausea: Secondary | ICD-10-CM | POA: Diagnosis not present

## 2015-12-12 MED ORDER — TECHNETIUM TC 99M SULFUR COLLOID
2.2000 | Freq: Once | INTRAVENOUS | Status: AC | PRN
  Administered 2015-12-12: 2.2 via INTRAVENOUS

## 2015-12-15 ENCOUNTER — Telehealth: Payer: Self-pay | Admitting: Gastroenterology

## 2015-12-15 NOTE — Telephone Encounter (Signed)
Pt aware results not reviewed and we will call as soon as available.

## 2015-12-18 ENCOUNTER — Emergency Department (HOSPITAL_COMMUNITY)
Admission: EM | Admit: 2015-12-18 | Discharge: 2015-12-18 | Disposition: A | Discharge status: Home / Self Care | Payer: BLUE CROSS/BLUE SHIELD | Attending: Physician Assistant | Admitting: Physician Assistant

## 2015-12-18 ENCOUNTER — Encounter (HOSPITAL_COMMUNITY): Payer: Self-pay | Admitting: Emergency Medicine

## 2015-12-18 DIAGNOSIS — Z79899 Other long term (current) drug therapy: Secondary | ICD-10-CM | POA: Insufficient documentation

## 2015-12-18 DIAGNOSIS — R1013 Epigastric pain: Secondary | ICD-10-CM | POA: Diagnosis not present

## 2015-12-18 DIAGNOSIS — I10 Essential (primary) hypertension: Secondary | ICD-10-CM | POA: Diagnosis not present

## 2015-12-18 DIAGNOSIS — K219 Gastro-esophageal reflux disease without esophagitis: Secondary | ICD-10-CM | POA: Diagnosis not present

## 2015-12-18 LAB — URINALYSIS, ROUTINE W REFLEX MICROSCOPIC
Bilirubin Urine: NEGATIVE
Glucose, UA: NEGATIVE mg/dL
Hgb urine dipstick: NEGATIVE
Ketones, ur: NEGATIVE mg/dL
Leukocytes, UA: NEGATIVE
Nitrite: NEGATIVE
Protein, ur: NEGATIVE mg/dL
Specific Gravity, Urine: 1.005 (ref 1.005–1.030)
pH: 6 (ref 5.0–8.0)

## 2015-12-18 LAB — COMPREHENSIVE METABOLIC PANEL
ALT: 15 U/L (ref 14–54)
AST: 24 U/L (ref 15–41)
Albumin: 4.3 g/dL (ref 3.5–5.0)
Alkaline Phosphatase: 69 U/L (ref 38–126)
Anion gap: 8 (ref 5–15)
BUN: 17 mg/dL (ref 6–20)
CO2: 27 mmol/L (ref 22–32)
Calcium: 8.9 mg/dL (ref 8.9–10.3)
Chloride: 103 mmol/L (ref 101–111)
Creatinine, Ser: 1.49 mg/dL — ABNORMAL HIGH (ref 0.44–1.00)
GFR calc Af Amer: 46 mL/min — ABNORMAL LOW (ref >60)
GFR calc non Af Amer: 40 mL/min — ABNORMAL LOW (ref >60)
Glucose, Bld: 119 mg/dL — ABNORMAL HIGH (ref 65–99)
Potassium: 3.5 mmol/L (ref 3.5–5.1)
Sodium: 138 mmol/L (ref 135–145)
Total Bilirubin: 0.9 mg/dL (ref 0.3–1.2)
Total Protein: 8 g/dL (ref 6.5–8.1)

## 2015-12-18 LAB — CBC
HCT: 38 % (ref 36.0–46.0)
Hemoglobin: 12.3 g/dL (ref 12.0–15.0)
MCH: 28.1 pg (ref 26.0–34.0)
MCHC: 32.4 g/dL (ref 30.0–36.0)
MCV: 86.8 fL (ref 78.0–100.0)
Platelets: 246 10*3/uL (ref 150–400)
RBC: 4.38 MIL/uL (ref 3.87–5.11)
RDW: 14 % (ref 11.5–15.5)
WBC: 7.1 10*3/uL (ref 4.0–10.5)

## 2015-12-18 LAB — LIPASE, BLOOD: Lipase: 24 U/L (ref 11–51)

## 2015-12-18 MED ORDER — GI COCKTAIL ~~LOC~~
30.0000 mL | Freq: Once | ORAL | Status: AC
  Administered 2015-12-18: 30 mL via ORAL
  Filled 2015-12-18: qty 30

## 2015-12-18 MED ORDER — ONDANSETRON 8 MG PO TBDP
8.0000 mg | ORAL_TABLET | Freq: Once | ORAL | Status: AC
  Administered 2015-12-18: 8 mg via ORAL
  Filled 2015-12-18: qty 1

## 2015-12-18 NOTE — Discharge Instructions (Signed)
Please follow up with [facility], and continue taking Protonix and Zofran as prescribed. Please see the attached document on food choices to relieve reflux, as diet changes may improve symptoms.

## 2015-12-18 NOTE — ED Notes (Signed)
PA at the bedside.

## 2015-12-18 NOTE — ED Provider Notes (Signed)
[facility] DEPT Provider Note   CSN: [id number] Arrival date & time: 12/18/15  1618     History   Chief Complaint Chief Complaint  Patient presents with  . Abdominal Pain    HPI Gabriela Lowe is a 50 y.o. female.  Patient is 50 yo F with PMH of GERD, hiatal hernia, and hypertension, presenting with chief complaint of epigastric abdominal pain and nausea for approximately 2 months. Patient is followed by [facility], had prior CT abdomen showing no acute abdominal pathology, and was reevaluated on 10/11 for persistent nausea, belching, and epigastric pain. Gastric emptying study performed on 10/24 was normal. Takes Protonix 40 mg BID which minimally improved symptoms, as well as Zofran for nausea. Patient presented to ED today because abdominal pain was worse, but patient denies any fever, chills, vomiting, back pain, or dysuria. Also reports mild shortness of breath and burning in throat, and has to elevate head of bed when sleeping. No recent weight loss or difficulty swallowing. Denies any alcohol, drugs, or tobacco use.      Past Medical History:  Diagnosis Date  . Arthritis    neck & back   . Bronchitis   . External hemorrhoid   . GERD (gastroesophageal reflux disease)   . Headache   . Hiatal hernia   . Hypertension   . Iron deficiency anemia    taking Fe TID, last hgb. 11.2, per pt.   . Numbness and tingling   . Schatzki's ring     Patient Active Problem List   Diagnosis Date Noted  . Gastroesophageal reflux disease 11/29/2015  . Paresthesia 11/27/2015  . Bloating 10/30/2015  . Weight loss 10/30/2015  . Nausea without vomiting 10/30/2015  . Belching 10/30/2015  . HTN (hypertension) 03/15/2013  . Microcytic anemia 03/15/2013    Past Surgical History:  Procedure Laterality Date  . BREAST LUMPECTOMY WITH RADIOACTIVE SEED LOCALIZATION Right 09/11/2015   Procedure: RIGHT BREAST LUMPECTOMY WITH RADIOACTIVE SEED LOCALIZATION;  Surgeon: Coralie Keens, MD;   Location: [facility];  Service: General;  Laterality: Right;  . CESAREAN SECTION     x 2  . CHOLECYSTECTOMY    . TUBAL LIGATION      OB History    No data available       Home Medications    Prior to Admission medications   Medication Sig Start Date End Date Taking? Authorizing Provider  butalbital-acetaminophen-caffeine (FIORICET, ESGIC) [phone] MG tablet Take 1-2 tablets by mouth every 6 (six) hours as needed for headache. 10/31/15 10/30/16  Olivia Canter Sam, PA-C  Cholecalciferol (DIALYVITE VITAMIN D 5000 PO) Take 1 capsule by mouth daily.    Historical Provider, MD  Cholecalciferol (VITAMIN D3) 5000 units CAPS Take by mouth daily.    Historical Provider, MD  [employer] MELATONIN 3 MG TABS Take 3 mg by mouth at bedtime as needed (for tinnitis).  10/09/15   Historical Provider, MD  cyclobenzaprine (FLEXERIL) 10 MG tablet Take 10 mg by mouth 3 (three) times daily as needed for muscle spasms. 10/25/15   Historical Provider, MD  ferrous sulfate 325 (65 FE) MG tablet Take 325 mg by mouth 3 (three) times daily. 07/14/15   Historical Provider, MD  hyoscyamine (LEVSIN SL) 0.125 MG SL tablet Take 1-2 tablet by mouth every 4-6 hours as needed. Patient taking differently: Take 0.125 mg by mouth every 4 (four) hours as needed for cramping. Take 1-2 tablet by mouth every 4-6 hours as needed. 03/28/15   Jerene Bears, MD  lisinopril-hydrochlorothiazide (  PRINZIDE,ZESTORETIC) 20-12.5 MG tablet Take 1 tablet by mouth daily after lunch.  08/01/15   Historical Provider, MD  Multiple Vitamin (MULTIVITAMIN WITH MINERALS) TABS tablet Take 1 tablet by mouth daily.    Historical Provider, MD  ondansetron (ZOFRAN ODT) 4 MG disintegrating tablet Take 1 tablet (4 mg total) by mouth every 8 (eight) hours as needed for nausea or vomiting. 11/29/15   Jessica D Zehr, PA-C  pantoprazole (PROTONIX) 40 MG tablet TAKE 1 TABLET BY MOUTH TWICE A DAY BEFORE MEALS 11/20/15   Jerene Bears, MD  Vitamin D, Ergocalciferol, (DRISDOL) 50000 units CAPS  capsule Take 50,000 Units by mouth every Thursday.     Historical Provider, MD    Family History Family History  Problem Relation Age of Onset  . Hypertension Mother   . Diabetes Mother   . Diabetes Father   . Congestive Heart Failure Father   . Diabetes Brother   . Colon cancer Neg Hx     Social History Social History  Substance Use Topics  . Smoking status: Never Smoker  . Smokeless tobacco: Never Used  . Alcohol use No     Allergies   Tramadol   Review of Systems Review of Systems  Constitutional: Negative for chills, fever and unexpected weight change.  HENT: Negative for ear pain, sore throat and trouble swallowing.   Eyes: Negative for pain and visual disturbance.  Respiratory: Positive for shortness of breath. Negative for cough and choking.   Cardiovascular: Negative for chest pain, palpitations and leg swelling.  Gastrointestinal: Positive for abdominal pain (epigastric pain only) and nausea. Negative for blood in stool and vomiting.  Genitourinary: Negative for dysuria, flank pain and hematuria.  Musculoskeletal: Negative for back pain and neck pain.  Skin: Negative for color change and rash.  Neurological: Negative for dizziness, seizures, syncope, weakness, numbness and headaches.     Physical Exam Updated Vital Signs BP 133/74 (BP Location: Right Arm)   Pulse 74   Temp 98.1 F (36.7 C) (Oral)   Resp 17   Ht 5\' 5"  (1.651 m)   Wt 95.3 kg   LMP 12/12/2015   SpO2 100%   BMI 34.95 kg/m   Physical Exam  Constitutional: She appears well-developed and well-nourished. No distress.  HENT:  Head: Normocephalic and atraumatic.  Mouth/Throat: Oropharynx is clear and moist.  Eyes: Conjunctivae are normal.  Neck: Normal range of motion.  Cardiovascular: Normal rate, regular rhythm, normal heart sounds and intact distal pulses.   Pulmonary/Chest: Effort normal and breath sounds normal. No respiratory distress.  Abdominal: Soft. Bowel sounds are normal.  She exhibits no distension. There is tenderness (Mild epigastric TTP). There is no guarding.  Musculoskeletal: Normal range of motion. She exhibits no edema or tenderness.  Neurological: She is alert.  Skin: Skin is warm and dry.  Psychiatric: She has a normal mood and affect.  Nursing note and vitals reviewed.    ED Treatments / Results  Labs (all labs ordered are listed, but only abnormal results are displayed) Labs Reviewed  COMPREHENSIVE METABOLIC PANEL - Abnormal; Notable for the following:       Result Value   Glucose, Bld 119 (*)    Creatinine, Ser 1.49 (*)    GFR calc non Af Amer 40 (*)    GFR calc Af Amer 46 (*)    All other components within normal limits  LIPASE, BLOOD  CBC  URINALYSIS, ROUTINE W REFLEX MICROSCOPIC (NOT AT [facility])    EKG  EKG Interpretation  None       Radiology No results found.  Procedures Procedures (including critical care time)  Medications Ordered in ED Medications  gi cocktail (Maalox,Lidocaine,Donnatal) (30 mLs Oral Given 12/18/15 1728)  ondansetron (ZOFRAN-ODT) disintegrating tablet 8 mg (8 mg Oral Given 12/18/15 1728)     Initial Impression / Assessment and Plan / ED Course  I have reviewed the triage vital signs and the nursing notes.  Pertinent labs & imaging results that were available during my care of the patient were reviewed by me and considered in my medical decision making (see chart for details).  Clinical Course   Patient is 50 yo F with PMH of GERD, followed by [facility], presenting with epigastric abdominal pain and nausea that has been chronic over the past 2 months. No acute abdominal pathology on prior CT and normal gastric emptying study on 10/24. Given normal findings, further imaging deferred. CBC, CMP, and lipase unremarkable. Epigastric pain relieved with GI cocktail and Zofran, and patient stable for d/c home with f/u at [facility]. Return precautions discussed for fever, worsening abdominal pain, or  vomiting.   Final Clinical Impressions(s) / ED Diagnoses   Final diagnoses:  Epigastric abdominal pain  Chronic GERD    New Prescriptions New Prescriptions   No medications on file     Rosilyn Mings II, PA 12/18/15 1821    Courteney Julio Alm, MD 12/18/15 2349

## 2015-12-18 NOTE — ED Triage Notes (Signed)
Pt reports constant upper abdominal pain and burning x2 weeks. Pt reports nausea but denies vomiting and diarrhea. Hx GERD.

## 2015-12-21 DIAGNOSIS — E559 Vitamin D deficiency, unspecified: Secondary | ICD-10-CM | POA: Diagnosis not present

## 2015-12-28 ENCOUNTER — Encounter: Payer: Self-pay | Admitting: Internal Medicine

## 2015-12-28 ENCOUNTER — Ambulatory Visit (AMBULATORY_SURGERY_CENTER): Payer: BLUE CROSS/BLUE SHIELD | Admitting: Internal Medicine

## 2015-12-28 VITALS — BP 148/73 | HR 55 | Temp 98.4°F | Resp 15 | Ht 65.0 in | Wt 214.0 lb

## 2015-12-28 DIAGNOSIS — R1013 Epigastric pain: Secondary | ICD-10-CM | POA: Diagnosis not present

## 2015-12-28 DIAGNOSIS — R110 Nausea: Secondary | ICD-10-CM | POA: Diagnosis not present

## 2015-12-28 LAB — GLUCOSE, CAPILLARY
Glucose-Capillary: 53 mg/dL — ABNORMAL LOW (ref 65–99)
Glucose-Capillary: 56 mg/dL — ABNORMAL LOW (ref 65–99)
Glucose-Capillary: 69 mg/dL (ref 65–99)

## 2015-12-28 MED ORDER — SODIUM CHLORIDE 0.9 % IV SOLN: 500.0000 mL | INTRAVENOUS | Status: DC

## 2015-12-28 NOTE — Op Note (Signed)
[facility] Patient Name: Gabriela Lowe Procedure Date: 12/28/2015 10:27 AM MRN: [id number] Endoscopist: Jerene Bears , MD Age: 50 Referring MD:  Date of Birth: [birth year] Gender: Female Account #: [id number] Procedure:                Upper GI endoscopy Indications:              Epigastric abdominal pain, Nausea, Weight loss,                            remote enteritis suggested by CT around 2015 not                            found at endoscopy or VCE at that time, history of                            IDA Medicines:                Monitored Anesthesia Care Procedure:                Pre-Anesthesia Assessment:                           - Prior to the procedure, a History and Physical                            was performed, and patient medications and                            allergies were reviewed. The patient's tolerance of                            previous anesthesia was also reviewed. The risks                            and benefits of the procedure and the sedation                            options and risks were discussed with the patient.                            All questions were answered, and informed consent                            was obtained. Prior Anticoagulants: The patient has                            taken no previous anticoagulant or antiplatelet                            agents. ASA Grade Assessment: II - A patient with                            mild systemic disease. After reviewing the risks  and benefits, the patient was deemed in                            satisfactory condition to undergo the procedure.                           After obtaining informed consent, the endoscope was                            passed under direct vision. Throughout the                            procedure, the patient's blood pressure, pulse, and                            oxygen saturations were monitored continuously. The                            Model GIF-[phone]) scope was introduced                            through the mouth, and advanced to the third part                            of duodenum. The upper GI endoscopy was                            accomplished without difficulty. The patient                            tolerated the procedure well. Scope In: Scope Out: Findings:                 The examined esophagus was normal.                           A 2 cm hiatal hernia was present.                           The entire examined stomach was normal. Biopsies                            were taken with a cold forceps for histology and                            Helicobacter pylori testing.                           The examined duodenum was normal. Biopsies for                            histology were taken with a cold forceps for                            evaluation of celiac disease. Complications:  No immediate complications. Estimated Blood Loss:     Estimated blood loss was minimal. Impression:               - Normal esophagus.                           - 2 cm hiatal hernia.                           - Normal stomach. Biopsied.                           - Normal examined duodenum. Biopsied. Recommendation:           - Patient has a contact number available for                            emergencies. The signs and symptoms of potential                            delayed complications were discussed with the                            patient. Return to normal activities tomorrow.                            Written discharge instructions were provided to the                            patient.                           - Resume previous diet.                           - Continue present medications.                           - Await pathology results.                           - If biopsies unrevealing consider empiric therapy                            for SIBO (nausea,  bloating, belching), repeat VCE,                            trial of motility agent despite normal GES.                           - Office follow-up in 3-4 weeks with me or APP. Jerene Bears, MD 12/28/2015 10:51:44 AM This report has been signed electronically.

## 2015-12-28 NOTE — Progress Notes (Signed)
Called to room to assist during endoscopic procedure.  Patient ID and intended procedure confirmed with present staff. Received instructions for my participation in the procedure from the performing physician.  

## 2015-12-28 NOTE — Patient Instructions (Signed)
Discharge instructions given. Handout on a hiatal hernia. Resume previous medications. Office will call and schedule follow up appointment. YOU HAD AN ENDOSCOPIC PROCEDURE TODAY AT [facility]:   Refer to the procedure report that was given to you for any specific questions about what was found during the examination.  If the procedure report does not answer your questions, please call your gastroenterologist to clarify.  If you requested that your care partner not be given the details of your procedure findings, then the procedure report has been included in a sealed envelope for you to review at your convenience later.  YOU SHOULD EXPECT: Some feelings of bloating in the abdomen. Passage of more gas than usual.  Walking can help get rid of the air that was put into your GI tract during the procedure and reduce the bloating. If you had a lower endoscopy (such as a colonoscopy or flexible sigmoidoscopy) you may notice spotting of blood in your stool or on the toilet paper. If you underwent a bowel prep for your procedure, you may not have a normal bowel movement for a few days.  Please Note:  You might notice some irritation and congestion in your nose or some drainage.  This is from the oxygen used during your procedure.  There is no need for concern and it should clear up in a day or so.  SYMPTOMS TO REPORT IMMEDIATELY:    Following upper endoscopy (EGD)  Vomiting of blood or coffee ground material  New chest pain or pain under the shoulder blades  Painful or persistently difficult swallowing  New shortness of breath  Fever of 100F or higher  Black, tarry-looking stools  For urgent or emergent issues, a gastroenterologist can be reached at any hour by calling [phone].   DIET:  We do recommend a small meal at first, but then you may proceed to your regular diet.  Drink plenty of fluids but you should avoid alcoholic beverages for 24 hours.  ACTIVITY:  You should  plan to take it easy for the rest of today and you should NOT DRIVE or use heavy machinery until tomorrow (because of the sedation medicines used during the test).    FOLLOW UP: Our staff will call the number listed on your records the next business day following your procedure to check on you and address any questions or concerns that you may have regarding the information given to you following your procedure. If we do not reach you, we will leave a message.  However, if you are feeling well and you are not experiencing any problems, there is no need to return our call.  We will assume that you have returned to your regular daily activities without incident.  If any biopsies were taken you will be contacted by phone or by letter within the next 1-3 weeks.  Please call us at [phone] if you have not heard about the biopsies in 3 weeks.    SIGNATURES/CONFIDENTIALITY: You and/or your care partner have signed paperwork which will be entered into your electronic medical record.  These signatures attest to the fact that that the information above on your After Visit Summary has been reviewed and is understood.  Full responsibility of the confidentiality of this discharge information lies with you and/or your care-partner.

## 2015-12-28 NOTE — Progress Notes (Signed)
A and O x3. Report to RN. Tolerated MAC anesthesia well.Teeth unchanged after procedure. 

## 2015-12-29 ENCOUNTER — Telehealth: Payer: Self-pay

## 2015-12-29 NOTE — Telephone Encounter (Signed)
  Follow up Call-  Call back number 12/28/2015  Post procedure Call Back phone  # [phone]  Permission to leave phone message Yes  Some recent data might be hidden     Patient questions:  Do you have a fever, pain , or abdominal swelling? No. Pain Score  0 *  Have you tolerated food without any problems? Yes.    Have you been able to return to your normal activities? Yes.    Do you have any questions about your discharge instructions: Diet   No. Medications  No. Follow up visit  No.  Do you have questions or concerns about your Care? No.  Actions: * If pain score is 4 or above: No action needed, pain <4.

## 2016-01-02 ENCOUNTER — Ambulatory Visit: Payer: BLUE CROSS/BLUE SHIELD | Admitting: Gastroenterology

## 2016-01-04 ENCOUNTER — Encounter: Payer: Self-pay | Admitting: Internal Medicine

## 2016-01-05 NOTE — Telephone Encounter (Signed)
Please let patient know her biopsies from stomach and small bowel are normal No evidence of abnormal cells, inflammation or infection. Given her ongoing issues with abdominal pain weight loss would repeat video capsule endoscopy. She has history of enteritis by scan previously not found on workup several years ago Will consider empiric treatment for SIBO after VCE Phone discussion in lieu of pathology letter

## 2016-01-08 DIAGNOSIS — R0781 Pleurodynia: Secondary | ICD-10-CM | POA: Diagnosis not present

## 2016-01-09 ENCOUNTER — Telehealth: Payer: Self-pay | Admitting: Internal Medicine

## 2016-01-09 DIAGNOSIS — R1084 Generalized abdominal pain: Secondary | ICD-10-CM

## 2016-01-09 DIAGNOSIS — D508 Other iron deficiency anemias: Secondary | ICD-10-CM

## 2016-01-09 DIAGNOSIS — R634 Abnormal weight loss: Secondary | ICD-10-CM

## 2016-01-16 NOTE — Telephone Encounter (Signed)
Patient has been scheduled for a capsule endoscopy for 01/26/16 .  I mailed her instructions.  She will call back for any questions once she receives them

## 2016-01-16 NOTE — Telephone Encounter (Signed)
Left message for patient to call back  

## 2016-01-17 ENCOUNTER — Other Ambulatory Visit: Payer: Self-pay | Admitting: Physician Assistant

## 2016-01-17 DIAGNOSIS — R0781 Pleurodynia: Secondary | ICD-10-CM

## 2016-01-17 DIAGNOSIS — R634 Abnormal weight loss: Secondary | ICD-10-CM

## 2016-01-19 ENCOUNTER — Ambulatory Visit (INDEPENDENT_AMBULATORY_CARE_PROVIDER_SITE_OTHER): Payer: BLUE CROSS/BLUE SHIELD | Admitting: Neurology

## 2016-01-19 ENCOUNTER — Encounter (INDEPENDENT_AMBULATORY_CARE_PROVIDER_SITE_OTHER): Payer: Self-pay | Admitting: Neurology

## 2016-01-19 DIAGNOSIS — R202 Paresthesia of skin: Secondary | ICD-10-CM

## 2016-01-19 DIAGNOSIS — Z0289 Encounter for other administrative examinations: Secondary | ICD-10-CM

## 2016-01-19 NOTE — Procedures (Signed)
Full Name: Gabriela Lowe Gender: Female MRN #: [id number] Date of Birth: January 11, 1966    Visit Date: 01/19/2016 08:53 Age: 50 Years 56 Months Old Examining Physician: Marcial Pacas, MD  Referring Physician: Krista Blue History: 50 years old female, presented with intermittent right arm and leg paresthesia  Study summary:  Nerve conduction study: Bilateral median sensory and mixed response showed prolonged peak latency, with normal snap amplitude. Right radial, sural, bilateral ulnar mixed and sensory responses were normal. Right median, ulnar, peroneal, tibial motor responses were normal. Left median motor responses showed mildly prolonged distal latency, normal C map amplitude, conduction velocity.  Electromyography: Selected needle examination of right upper, lower extremity and right cervical, and lumbar paraspinal muscles were normal.   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    ------------------------------- Marcial Pacas, M.D.  [facility] [place], [place] [address] Tel: [phone] Fax: [phone]        [facility]    Nerve / Sites Rec. Site Peak Lat Ref. Amp.1-2 Ref. Distance    ms ms V V cm  R Radial - Anatomical snuff box (Forearm)     Forearm Wrist 2.24 ?2.90 16.8 ?15.0 10  R Sural - Ankle (Calf)     Calf Ankle 3.70 ?4.40 14.2 ?6.0 14  R Median, Ulnar - Transcarpal comparison     Median Palm Wrist 2.92 ?2.20 24.9 ?40.0 8     Ulnar Palm Wrist 1.72 ?2.20 35.2 ?12.0 8          L Median, Ulnar - Transcarpal comparison     Median Palm Wrist 3.44 ?2.20 23.5 ?40.0 8     Ulnar Palm Wrist 1.67 ?2.20 18.7 ?12.0 8          R Median - Orthodromic (Dig II, Mid palm)     Dig II Wrist 3.54 ?3.40 19.2 ?10.0 13  L Median -  Orthodromic (Dig II, Mid palm)     Dig II Wrist 4.27 ?3.40 9.6 ?10.0 13  R Ulnar - Orthodromic, (Dig V, Mid palm)     Dig V Wrist 2.34 ?3.10 14.8 ?5.0 11     [facility]    Nerve / Sites Muscle Latency Ref. Amplitude Ref. Rel Amp Segments Distance Lat Diff Velocity Ref. Area    ms ms mV mV %  cm ms m/s m/s mVms  R Median - APB     Wrist APB 3.8 ?4.4 9.9 ?4.0 100 Wrist - APB 7    31.6     Upper arm APB 8.1  9.5  96.3 Upper arm - Wrist 24 4.3 56  35.0  L Median - APB     Wrist APB 4.5 ?4.4 10.4 ?4.0 100 Wrist - APB 7    35.2     Upper arm APB 8.6  11.2  108 Upper arm - Wrist 23 4.1 56  37.8  R Ulnar - ADM     Wrist ADM 2.2 ?3.3 13.9 ?6.0 100 Wrist - ADM 7    36.6     B.Elbow ADM 5.3  13.1  94.2 B.Elbow - Wrist 18 3.1 59 ?49 34.4     A.Elbow ADM 7.2  12.6  96.1 A.Elbow - B.Elbow 11 1.9 59 ?49 33.7  A.Elbow - Wrist  4.9     R Peroneal - EDB     Ankle EDB 4.5 ?6.5 12.9 ?2.0 100 Ankle - EDB 9    35.9     Fib head EDB 10.5  11.1  86.1 Fib head - Ankle 28 6.0 46 ?44 33.0     Pop fossa EDB 12.7  11.7  105 Pop fossa - Fib head 10 2.1 47 ?44 34.2         Pop fossa - Ankle  8.2     R Tibial - AH     Ankle AH 4.4 ?5.8 14.0 ?4.0 100 Ankle - AH 9    43.4     Pop fossa AH 13.1  11.4  81.9 Pop fossa - Ankle 36 8.6 42 ?16      F  Wave    Nerve F Lat Ref.   ms ms  R Peroneal - EDB 54.6 ?56.0  R Tibial - AH 51.3 ?56.0  R Ulnar - ADM 27.0 ?32.0     H Reflex    Nerve H Lat Lat Hmax   ms ms   Left Right Ref. Left Right Ref.  Tibial - Soleus 32.2 32.2 ?35.0 32.9 33.2 ?35.0     EMG full       EMG Summary Table    Spontaneous MUAP Recruitment  Muscle IA Fib PSW Fasc Other Amp Dur. Poly Pattern  R. Pronator teres Normal None None None _______ Normal Normal Normal Normal  R. Extensor digitorum communis Normal None None None _______ Normal Normal Normal Normal  R. Biceps brachii Normal None None None _______ Normal Normal Normal Normal  R. Deltoid Normal None None None _______ Normal Normal  Normal Normal  R. Triceps brachii Normal None None None _______ Normal Normal Normal Normal  R. Cervical paraspinals Normal None None None _______ Normal Normal Normal Normal  R. Tibialis posterior Normal None None None _______ Normal Normal Normal Normal  R. Gastrocnemius (Medial head) Normal None None None _______ Normal Normal Normal Normal  R. Peroneus longus Normal None None None _______ Normal Normal Normal Normal  R. Vastus lateralis Normal None None None _______ Normal Normal Normal Normal  R. Lumbar paraspinals (mid) Normal None None None _______ Normal Normal Normal Normal  R. Lumbar paraspinals (low) Normal None None None _______ Normal Normal Normal Normal

## 2016-01-23 ENCOUNTER — Ambulatory Visit
Admission: RE | Admit: 2016-01-23 | Discharge: 2016-01-23 | Disposition: A | Discharge status: Home / Self Care | Payer: BLUE CROSS/BLUE SHIELD | Source: Ambulatory Visit | Attending: Physician Assistant | Admitting: Physician Assistant

## 2016-01-23 DIAGNOSIS — R634 Abnormal weight loss: Secondary | ICD-10-CM

## 2016-01-23 DIAGNOSIS — R0781 Pleurodynia: Secondary | ICD-10-CM | POA: Diagnosis not present

## 2016-01-23 MED ORDER — IOPAMIDOL (ISOVUE-300) INJECTION 61%
75.0000 mL | Freq: Once | INTRAVENOUS | Status: AC | PRN
  Administered 2016-01-23: 75 mL via INTRAVENOUS

## 2016-01-24 ENCOUNTER — Ambulatory Visit: Payer: BLUE CROSS/BLUE SHIELD | Admitting: Gastroenterology

## 2016-01-26 ENCOUNTER — Ambulatory Visit (INDEPENDENT_AMBULATORY_CARE_PROVIDER_SITE_OTHER): Payer: BLUE CROSS/BLUE SHIELD | Admitting: Internal Medicine

## 2016-01-26 ENCOUNTER — Encounter: Payer: Self-pay | Admitting: Internal Medicine

## 2016-01-26 DIAGNOSIS — D508 Other iron deficiency anemias: Secondary | ICD-10-CM | POA: Diagnosis not present

## 2016-01-26 NOTE — Progress Notes (Signed)
Pt tolerated capsule well, all questions answered and verbalized written and verbal instructions.

## 2016-02-04 ENCOUNTER — Encounter: Payer: Self-pay | Admitting: Internal Medicine

## 2016-02-07 DIAGNOSIS — R202 Paresthesia of skin: Secondary | ICD-10-CM | POA: Diagnosis not present

## 2016-02-07 DIAGNOSIS — J069 Acute upper respiratory infection, unspecified: Secondary | ICD-10-CM | POA: Diagnosis not present

## 2016-02-13 ENCOUNTER — Encounter: Payer: Self-pay | Admitting: Internal Medicine

## 2016-02-20 DIAGNOSIS — N910 Primary amenorrhea: Secondary | ICD-10-CM | POA: Diagnosis not present

## 2016-02-20 DIAGNOSIS — Z01419 Encounter for gynecological examination (general) (routine) without abnormal findings: Secondary | ICD-10-CM | POA: Diagnosis not present

## 2016-02-27 DIAGNOSIS — M542 Cervicalgia: Secondary | ICD-10-CM | POA: Diagnosis not present

## 2016-02-27 DIAGNOSIS — H6123 Impacted cerumen, bilateral: Secondary | ICD-10-CM | POA: Diagnosis not present

## 2016-02-27 DIAGNOSIS — R51 Headache: Secondary | ICD-10-CM | POA: Diagnosis not present

## 2016-02-27 DIAGNOSIS — J3489 Other specified disorders of nose and nasal sinuses: Secondary | ICD-10-CM | POA: Diagnosis not present

## 2016-03-05 ENCOUNTER — Emergency Department (HOSPITAL_COMMUNITY)
Admission: EM | Admit: 2016-03-05 | Discharge: 2016-03-05 | Disposition: A | Discharge status: Home / Self Care | Payer: BLUE CROSS/BLUE SHIELD | Attending: Emergency Medicine | Admitting: Emergency Medicine

## 2016-03-05 ENCOUNTER — Other Ambulatory Visit: Payer: Self-pay

## 2016-03-05 ENCOUNTER — Emergency Department (HOSPITAL_COMMUNITY): Payer: BLUE CROSS/BLUE SHIELD

## 2016-03-05 ENCOUNTER — Encounter (HOSPITAL_COMMUNITY): Payer: Self-pay

## 2016-03-05 DIAGNOSIS — Z7984 Long term (current) use of oral hypoglycemic drugs: Secondary | ICD-10-CM | POA: Diagnosis not present

## 2016-03-05 DIAGNOSIS — Z79899 Other long term (current) drug therapy: Secondary | ICD-10-CM | POA: Insufficient documentation

## 2016-03-05 DIAGNOSIS — R079 Chest pain, unspecified: Secondary | ICD-10-CM | POA: Diagnosis not present

## 2016-03-05 DIAGNOSIS — I10 Essential (primary) hypertension: Secondary | ICD-10-CM | POA: Insufficient documentation

## 2016-03-05 DIAGNOSIS — E119 Type 2 diabetes mellitus without complications: Secondary | ICD-10-CM | POA: Diagnosis not present

## 2016-03-05 DIAGNOSIS — R0789 Other chest pain: Secondary | ICD-10-CM | POA: Diagnosis not present

## 2016-03-05 HISTORY — DX: Type 2 diabetes mellitus without complications: E11.9

## 2016-03-05 LAB — BASIC METABOLIC PANEL
Anion gap: 5 (ref 5–15)
BUN: 8 mg/dL (ref 6–20)
CO2: 29 mmol/L (ref 22–32)
Calcium: 9.4 mg/dL (ref 8.9–10.3)
Chloride: 106 mmol/L (ref 101–111)
Creatinine, Ser: 1.12 mg/dL — ABNORMAL HIGH (ref 0.44–1.00)
GFR calc Af Amer: >60 mL/min (ref >60)
GFR calc non Af Amer: 56 mL/min — ABNORMAL LOW (ref >60)
Glucose, Bld: 116 mg/dL — ABNORMAL HIGH (ref 65–99)
Potassium: 3.7 mmol/L (ref 3.5–5.1)
Sodium: 140 mmol/L (ref 135–145)

## 2016-03-05 LAB — CBC
HCT: 36.6 % (ref 36.0–46.0)
Hemoglobin: 12 g/dL (ref 12.0–15.0)
MCH: 28.7 pg (ref 26.0–34.0)
MCHC: 32.8 g/dL (ref 30.0–36.0)
MCV: 87.6 fL (ref 78.0–100.0)
Platelets: 241 10*3/uL (ref 150–400)
RBC: 4.18 MIL/uL (ref 3.87–5.11)
RDW: 13.3 % (ref 11.5–15.5)
WBC: 6.8 10*3/uL (ref 4.0–10.5)

## 2016-03-05 LAB — I-STAT TROPONIN, ED
Troponin i, poc: 0 ng/mL (ref 0.00–0.08)
Troponin i, poc: 0 ng/mL (ref 0.00–0.08)

## 2016-03-05 NOTE — ED Provider Notes (Signed)
Medical screening examination/treatment/procedure(s) were performed by non-physician practitioner and as supervising physician I was immediately available for consultation/collaboration.   EKG Interpretation  Date/Time:  Tuesday March 05 2016 18:52:42 EST Ventricular Rate:  61 PR Interval:  150 QRS Duration: 86 QT Interval:  430 QTC Calculation: 432 R Axis:   27 Text Interpretation:  Normal sinus rhythm Normal ECG No significant change since last tracing Confirmed by Tariyah Pendry  MD, Estevon Fluke ([id number]) on 03/05/2016 11:23:59 PM     51 year old female has been having left-sided sharp pinpoint chest tenderness 2 days. Pain last for 1-2 minutes and is not associated with dyspnea diaphoresis. Her delta troponin here is negative. Heart score is less than 3. Will give follow-up   Lacretia Leigh, MD 03/05/16 2328

## 2016-03-05 NOTE — Discharge Instructions (Signed)
Continue your regular home medications. Follow-up with your primary care doctor. May also wish to follow-up with cardiology if symptoms persist. Return to the ED for new or worsening symptoms.

## 2016-03-05 NOTE — ED Triage Notes (Signed)
Per pT, Pt started to have mid-center chest pain that radiate to her back about two days ago. Denies any SOB, N/V/D, or any other symptoms

## 2016-03-05 NOTE — ED Provider Notes (Signed)
[facility] DEPT Provider Note   CSN: [id number] Arrival date & time: 03/05/16  1847     History   Chief Complaint Chief Complaint  Patient presents with  . Chest Pain    HPI Gabriela Lowe is a 51 y.o. female.  The history is provided by the patient and medical records.  Chest Pain      51 year old female with history of arthritis, diabetes, GERD, hypertension, Schatzki's ring, presenting to the ED for chest pain.  Patient reports this began about 2-3 days ago. States pain is intermittent and localized to the left upper chest and right upper back. States pain is a dull ache when it comes. States the last about 2-3 minutes at a time before resolving spontaneously. She does not have any associated shortness of breath, diaphoresis, nausea, vomiting, left-sided neck pain, or left arm pain. She denies any chest ripping sensations. No dizziness or syncope. She denies any alleviating or exacerbating factors. She denies any injury or trauma, but states she does work at [employer] and [employer] frequently. Patient has no known cardiac history. She has no family cardiac history. She is not a smoker. States she had a CT of her chest in December 2017 which showed some pericardial thickening, but states she was told it was nothing to worry about. She has never been evaluated by cardiology.  Past Medical History:  Diagnosis Date  . Arthritis    neck & back   . Bronchitis   . Diabetes mellitus without complication ([place])   . External hemorrhoid   . GERD (gastroesophageal reflux disease)   . Headache   . Hiatal hernia   . Hypertension   . Iron deficiency anemia    taking Fe TID, last hgb. 11.2, per pt.   . Numbness and tingling   . Schatzki's ring     Patient Active Problem List   Diagnosis Date Noted  . Gastroesophageal reflux disease 11/29/2015  . Paresthesia 11/27/2015  . Bloating 10/30/2015  . Weight loss 10/30/2015  . Nausea without vomiting 10/30/2015  . Belching  10/30/2015  . HTN (hypertension) 03/15/2013  . Microcytic anemia 03/15/2013    Past Surgical History:  Procedure Laterality Date  . BREAST LUMPECTOMY WITH RADIOACTIVE SEED LOCALIZATION Right 09/11/2015   Procedure: RIGHT BREAST LUMPECTOMY WITH RADIOACTIVE SEED LOCALIZATION;  Surgeon: Coralie Keens, MD;  Location: [facility];  Service: General;  Laterality: Right;  . CESAREAN SECTION     x 2  . CHOLECYSTECTOMY    . TUBAL LIGATION      OB History    No data available       Home Medications    Prior to Admission medications   Medication Sig Start Date End Date Taking? Authorizing Provider  B Complex-C-Folic Acid (DIALYVITE TABLET) TABS Take 1 tablet by mouth daily.   Yes Historical Provider, MD  butalbital-acetaminophen-caffeine (FIORICET, ESGIC) [phone] MG tablet Take 1-2 tablets by mouth every 6 (six) hours as needed for headache. 10/31/15 10/30/16 Yes Olivia Canter Sam, PA-C  [facility] MELATONIN 3 MG TABS Take 3 mg by mouth at bedtime as needed (for tinnitis).  10/09/15  Yes Historical Provider, MD  cyclobenzaprine (FLEXERIL) 10 MG tablet Take 10 mg by mouth 3 (three) times daily as needed for muscle spasms. 10/25/15  Yes Historical Provider, MD  ferrous sulfate 325 (65 FE) MG tablet Take 325 mg by mouth 3 (three) times daily. 07/14/15  Yes Historical Provider, MD  fluticasone (FLONASE) 50 MCG/ACT nasal spray Place 2 sprays  into both nostrils daily. 02/27/16  Yes Historical Provider, MD  gabapentin (NEURONTIN) 300 MG capsule Take 300 mg by mouth every evening. 02/07/16  Yes Historical Provider, MD  hyoscyamine (LEVSIN SL) 0.125 MG SL tablet Take 1-2 tablet by mouth every 4-6 hours as needed. Patient taking differently: Take 0.125 mg by mouth every 4 (four) hours as needed for cramping. Take 1-2 tablet by mouth every 4-6 hours as needed. 03/28/15  Yes Jerene Bears, MD  lisinopril-hydrochlorothiazide (PRINZIDE,ZESTORETIC) 20-12.5 MG tablet Take 1 tablet by mouth daily after lunch.  08/01/15  Yes Historical  Provider, MD  meloxicam (MOBIC) 15 MG tablet Take 15 mg by mouth daily as needed for pain (or inflammation).  02/21/16  Yes Historical Provider, MD  metFORMIN (GLUCOPHAGE) 500 MG tablet Take 500 mg by mouth every evening. 12/11/15  Yes Historical Provider, MD  Multiple Vitamin (MULTIVITAMIN WITH MINERALS) TABS tablet Take 1 tablet by mouth daily.   Yes Historical Provider, MD  ondansetron (ZOFRAN ODT) 4 MG disintegrating tablet Take 1 tablet (4 mg total) by mouth every 8 (eight) hours as needed for nausea or vomiting. 11/29/15  Yes Jessica D Zehr, PA-C  pantoprazole (PROTONIX) 40 MG tablet TAKE 1 TABLET BY MOUTH TWICE A DAY BEFORE MEALS Patient taking differently: TAKE 40mg  TABLET BY MOUTH TWICE A DAY as needed for heartburn BEFORE MEALS 11/20/15  Yes Jerene Bears, MD    Family History Family History  Problem Relation Age of Onset  . Hypertension Mother   . Diabetes Mother   . Diabetes Father   . Congestive Heart Failure Father   . Diabetes Brother   . Colon cancer Neg Hx     Social History Social History  Substance Use Topics  . Smoking status: Never Smoker  . Smokeless tobacco: Never Used  . Alcohol use No     Allergies   Tramadol   Review of Systems Review of Systems  Cardiovascular: Positive for chest pain.  All other systems reviewed and are negative.    Physical Exam Updated Vital Signs BP 121/79   Pulse 61   Temp 98.1 F (36.7 C) (Oral)   Resp 15   Ht 5\' 4"  (1.626 m)   Wt 90.3 kg   SpO2 100%   BMI 34.16 kg/m   Physical Exam  Constitutional: She is oriented to person, place, and time. She appears well-developed and well-nourished.  HENT:  Head: Normocephalic and atraumatic.  Mouth/Throat: Oropharynx is clear and moist.  Eyes: Conjunctivae and EOM are normal. Pupils are equal, round, and reactive to light.  Neck: Normal range of motion.  Cardiovascular: Normal rate, regular rhythm and normal heart sounds.   Pulmonary/Chest: Effort normal and breath  sounds normal.  Left upper chest wall is mildly tender to palpation. There is no bony deformity or crepitus, lungs clear bilaterally, no distress  Abdominal: Soft. Bowel sounds are normal.  Musculoskeletal: Normal range of motion.  Extremity pulses intact x4  Neurological: She is alert and oriented to person, place, and time.  Skin: Skin is warm and dry.  Psychiatric: She has a normal mood and affect.  Nursing note and vitals reviewed.    ED Treatments / Results  Labs (all labs ordered are listed, but only abnormal results are displayed) Labs Reviewed  BASIC METABOLIC PANEL - Abnormal; Notable for the following:       Result Value   Glucose, Bld 116 (*)    Creatinine, Ser 1.12 (*)    GFR calc non Af Wyvonnia Lora  56 (*)    All other components within normal limits  CBC  I-STAT TROPOININ, ED  I-STAT TROPOININ, ED    EKG  EKG Interpretation  Date/Time:  Tuesday March 05 2016 18:52:42 EST Ventricular Rate:  61 PR Interval:  150 QRS Duration: 86 QT Interval:  430 QTC Calculation: 432 R Axis:   27 Text Interpretation:  Normal sinus rhythm Normal ECG No significant change since last tracing Confirmed by Zenia Resides  MD, ANTHONY ([id number]) on 03/05/2016 11:23:59 PM       Radiology Dg Chest 2 View  Result Date: 03/05/2016 CLINICAL DATA:  Chest pain radiating posteriorly for 3 days. EXAM: CHEST  2 VIEW COMPARISON:  Chest x-ray dated 11/04/2015. FINDINGS: Cardiomediastinal silhouette is normal in size and configuration. Lungs are clear. Lung volumes are normal. No evidence of pneumonia. No pleural effusion. No pneumothorax. Osseous and soft tissue structures about the chest are unremarkable. IMPRESSION: Normal chest x-ray. Electronically Signed   By: Franki Cabot M.D.   On: 03/05/2016 19:56    Procedures Procedures (including critical care time)  Medications Ordered in ED Medications - No data to display   Initial Impression / Assessment and Plan / ED Course  I have reviewed the triage  vital signs and the nursing notes.  Pertinent labs & imaging results that were available during my care of the patient were reviewed by me and considered in my medical decision making (see chart for details).  Clinical Course    50 year old female here with chest pain. Began 2 days ago and has been intermittent. Lasting variable amount of time but generally less than 2 minutes and resolve spontaneously. No associated symptoms. No known cardiac history. EKG is nonischemic. Labwork obtained from triage is reassuring, troponin is negative. Chest x-ray is clear.  Patient did have CT scan in Dec 2017 with pericardiac thickening, however she has not diffuse ST changes on EKG today or fever suggestive of this.  No hx of IVDU or cancer.  No tachycardia or hypoxia to suggest acute PE.  She does have RF of age, HTN, and DM, will obtain delta troponin.  Delta troponin is negative.  Feel that patient is stable for discharge given her negative work-up here.  I recommended that she follow-up closely with her primary care doctor. She was also given cardiology referral should symptoms persist.  Discussed plan with patient, she acknowledged understanding and agreed with plan of care.  Return precautions given for new or worsening symptoms.  Final Clinical Impressions(s) / ED Diagnoses   Final diagnoses:  Chest pain, unspecified type    New Prescriptions New Prescriptions   No medications on file     Larene Pickett, PA-C 03/05/16 2341

## 2016-03-11 DIAGNOSIS — R202 Paresthesia of skin: Secondary | ICD-10-CM | POA: Diagnosis not present

## 2016-03-11 DIAGNOSIS — E119 Type 2 diabetes mellitus without complications: Secondary | ICD-10-CM | POA: Diagnosis not present

## 2016-03-11 DIAGNOSIS — R079 Chest pain, unspecified: Secondary | ICD-10-CM | POA: Diagnosis not present

## 2016-03-11 DIAGNOSIS — I10 Essential (primary) hypertension: Secondary | ICD-10-CM | POA: Diagnosis not present

## 2016-03-13 ENCOUNTER — Telehealth: Payer: Self-pay | Admitting: Adult Health

## 2016-03-13 NOTE — Telephone Encounter (Signed)
I scheduled patient with Gabriela Lowe on 3-21 for a f/u for neuropathy. She saw Dr. Krista Blue last October and also had NCV/EMG in December. She is still having the same problems.

## 2016-03-15 ENCOUNTER — Other Ambulatory Visit: Payer: Self-pay | Admitting: Physician Assistant

## 2016-03-15 DIAGNOSIS — R1012 Left upper quadrant pain: Secondary | ICD-10-CM

## 2016-03-21 ENCOUNTER — Ambulatory Visit
Admission: RE | Admit: 2016-03-21 | Discharge: 2016-03-21 | Disposition: A | Discharge status: Home / Self Care | Payer: BLUE CROSS/BLUE SHIELD | Source: Ambulatory Visit | Attending: Physician Assistant | Admitting: Physician Assistant

## 2016-03-21 DIAGNOSIS — R1012 Left upper quadrant pain: Secondary | ICD-10-CM

## 2016-03-26 ENCOUNTER — Encounter: Payer: Self-pay | Admitting: Physician Assistant

## 2016-03-26 ENCOUNTER — Ambulatory Visit (INDEPENDENT_AMBULATORY_CARE_PROVIDER_SITE_OTHER): Payer: BLUE CROSS/BLUE SHIELD | Admitting: Physician Assistant

## 2016-03-26 VITALS — BP 128/86 | HR 68 | Ht 64.0 in | Wt 196.6 lb

## 2016-03-26 DIAGNOSIS — I10 Essential (primary) hypertension: Secondary | ICD-10-CM

## 2016-03-26 DIAGNOSIS — E118 Type 2 diabetes mellitus with unspecified complications: Secondary | ICD-10-CM | POA: Diagnosis not present

## 2016-03-26 DIAGNOSIS — R079 Chest pain, unspecified: Secondary | ICD-10-CM | POA: Diagnosis not present

## 2016-03-26 NOTE — Progress Notes (Signed)
Cardiology Office Note    Date:  03/26/2016   ID:  Gabriela Lowe, DOB 11/02/1965, MRN [id number]  PCP:  Gabriela Leriche, PA-C  Cardiologist:  Gabriela Lowe (discussed case with DOD, Gabriela Lowe)   CC: chest pain  History of Present Illness:  Gabriela Lowe is a 51 y.o. female with a history of arthritis, DMT2, GERD, HTN, and Schatzki's ring who presents to clinic for Gabriela Lowe patient evaluation of chest pain.  She is followed by Gabriela Lowe GI: prior CT abdomen showed no acute abdominal pathology, and was reevaluated on 10/11 for persistent nausea, belching, and epigastric pain. Gastric emptying study performed on 12/12/14 was normal.   Chest CT in 01/2016 showed some mild pericardial thickening questioning possible pericarditis. No coronary calcification noted.   She was seen in the ER on 12/18/15 for abdominal pain and again 03/05/16 for chest pain. Delta troponin negative and ECG with no evidence of ischemia or pericarditis and she was discharged home.   Today she presents to clinic for follow up. She describes the chest pain as a pulling in her chest that comes and goes and lasts for minutes. Not related to exertion and actually better when she gets moving. Worse when she lays in a supine position and better leaning forward. Not worse with deep inspiration. No LE edema, orthopnea or PND. No dizziness or syncope. No blood in stool or urine. No recent viral illness. She has been dealing with a lot of GERD and GI issues and lost 27 lbs in 5 months. GI work up has been largely unremarkable.   Her father had CHF, CAD and has a pacemaker.      Past Medical History:  Diagnosis Date  . Arthritis    neck & back   . Bronchitis   . Diabetes mellitus without complication ([place])   . External hemorrhoid   . GERD (gastroesophageal reflux disease)   . Headache   . Hiatal hernia   . Hypertension   . Iron deficiency anemia    taking Fe TID, last hgb. 11.2, per pt.   . Numbness and tingling   . Schatzki's  ring     Past Surgical History:  Procedure Laterality Date  . BREAST LUMPECTOMY WITH RADIOACTIVE SEED LOCALIZATION Right 09/11/2015   Procedure: RIGHT BREAST LUMPECTOMY WITH RADIOACTIVE SEED LOCALIZATION;  Surgeon: Gabriela Keens, MD;  Location: [facility];  Service: General;  Laterality: Right;  . CESAREAN SECTION     x 2  . CHOLECYSTECTOMY    . TUBAL LIGATION      Current Medications: Outpatient Medications Prior to Visit  Medication Sig Dispense Refill  . B Complex-C-Folic Acid (DIALYVITE TABLET) TABS Take 1 tablet by mouth daily.    . ferrous sulfate 325 (65 FE) MG tablet Take 325 mg by mouth 3 (three) times daily.    . fluticasone (FLONASE) 50 MCG/ACT nasal spray Place 2 sprays into both nostrils daily.    Marland Kitchen gabapentin (NEURONTIN) 300 MG capsule Take 300 mg by mouth every evening.    . hyoscyamine (LEVSIN SL) 0.125 MG SL tablet Take 1-2 tablet by mouth every 4-6 hours as needed. (Patient taking differently: Take 0.125 mg by mouth every 4 (four) hours as needed for cramping. Take 1-2 tablet by mouth every 4-6 hours as needed.) 50 tablet 2  . lisinopril-hydrochlorothiazide (PRINZIDE,ZESTORETIC) 20-12.5 MG tablet Take 1 tablet by mouth daily after lunch.   0  . metFORMIN (GLUCOPHAGE) 500 MG tablet Take 500 mg by mouth every evening.  5  .  Multiple Vitamin (MULTIVITAMIN WITH MINERALS) TABS tablet Take 1 tablet by mouth daily.    . ondansetron (ZOFRAN ODT) 4 MG disintegrating tablet Take 1 tablet (4 mg total) by mouth every 8 (eight) hours as needed for nausea or vomiting. 20 tablet 0  . butalbital-acetaminophen-caffeine (FIORICET, ESGIC) 50-325-40 MG tablet Take 1-2 tablets by mouth every 6 (six) hours as needed for headache. (Patient not taking: Reported on 03/26/2016) 20 tablet 0  . [facility] MELATONIN 3 MG TABS Take 3 mg by mouth at bedtime as needed (for tinnitis).   11  . cyclobenzaprine (FLEXERIL) 10 MG tablet Take 10 mg by mouth 3 (three) times daily as needed for muscle spasms.    .  meloxicam (MOBIC) 15 MG tablet Take 15 mg by mouth daily as needed for pain (or inflammation).     . pantoprazole (PROTONIX) 40 MG tablet TAKE 1 TABLET BY MOUTH TWICE A DAY BEFORE MEALS (Patient not taking: Reported on 03/26/2016) 60 tablet 3   Facility-Administered Medications Prior to Visit  Medication Dose Route Frequency Provider Last Rate Last Dose  . 0.9 %  sodium chloride infusion  500 mL Intravenous Continuous Gabriela Bears, MD         Allergies:   Tramadol   Social History   Social History  . Marital status: Single    Spouse name: N/A  . Number of children: 2  . Years of education: 3 years college   Occupational History  . Sales Associate    Social History Main Topics  . Smoking status: Never Smoker  . Smokeless tobacco: Never Used  . Alcohol use No  . Drug use: No  . Sexual activity: Not Asked   Other Topics Concern  . None   Social History Narrative   Lives at home with her son and her partner, Gabriela Lowe.   Right-handed.   No caffeine use.     Family History:  The patient's family history includes Congestive Heart Failure in her father; Diabetes in her brother, father, and mother; Hypertension in her mother.      ROS:   Please see the history of present illness.    ROS All other systems reviewed and are negative.   PHYSICAL EXAM:   VS:  BP 128/86   Pulse 68   Ht 5' 4"  (1.626 m)   Wt 196 lb 9.6 oz (89.2 kg)   SpO2 98%   BMI 33.75 kg/m    GEN: Well nourished, well developed, in no acute distress  HEENT: normal  Neck: no JVD, carotid bruits, or masses Cardiac: RRR; no murmurs, rubs, or gallops,no edema  Respiratory:  clear to auscultation bilaterally, normal work of breathing GI: soft, nontender, nondistended, + BS MS: no deformity or atrophy  Skin: warm and dry, no rash Neuro:  Alert and Oriented x 3, Strength and sensation are intact Psych: euthymic mood, full affect   Wt Readings from Last 3 Encounters:  03/26/16 196 lb 9.6 oz (89.2 kg)    03/05/16 199 lb (90.3 kg)  12/28/15 214 lb (97.1 kg)      Studies/Labs Reviewed:   EKG:  EKG is NOT ordered today.   Recent Labs: 07/14/2015: B Natriuretic Peptide 363.6 11/27/2015: TSH 0.616 12/18/2015: ALT 15 03/05/2016: BUN 8; Creatinine, Ser 1.12; Hemoglobin 12.0; Platelets 241; Potassium 3.7; Sodium 140   Lipid Panel No results found for: CHOL, TRIG, HDL, CHOLHDL, VLDL, LDLCALC, LDLDIRECT  Additional studies/ records that were reviewed today include:  CT Chest 01/23/16 IMPRESSION: Mild pericardial  thickening anteriorly, question pericarditis. Otherwise negative    ASSESSMENT & PLAN:   Chest pain: atypical for ischemic chest pain. Possibly related to pericarditis with recent CT with pericardial thickening, although no ST elevation on ECG. Before treating empirically for pericarditis (meds can cause further GI upset), I would like to check ESR/CRP and 2D ECHO.  HTN: BP well controlled today   DMT2: continue current regimen, HgA1c 6.3   GERD: continue follow up with GI  Medication Adjustments/Labs and Tests Ordered: Current medicines are reviewed at length with the patient today.  Concerns regarding medicines are outlined above.  Medication changes, Labs and Tests ordered today are listed in the Patient Instructions below. Patient Instructions  Medication Instructions:  Your physician recommends that you continue on your current medications as directed. Please refer to the Current Medication list given to you today.  Labwork: TODAY:  CRP & ESR  Testing/Procedures: Your physician has requested that you have an echocardiogram. Echocardiography is a painless test that uses sound waves to create images of your heart. It provides your doctor with information about the size and shape of your heart and how well your heart's chambers and valves are working. This procedure takes approximately one hour. There are no restrictions for this procedure.   Follow-Up: Your physician  recommends that you schedule a follow-up appointment in: [place], PA-C   Any Other Special Instructions Will Be Listed Below (If Applicable). Echocardiogram An echocardiogram, or echocardiography, uses sound waves (ultrasound) to produce an image of your heart. The echocardiogram is simple, painless, obtained within a short period of time, and offers valuable information to your health care provider. The images from an echocardiogram can provide information such as:  Evidence of coronary artery disease (CAD).  Heart size.  Heart muscle function.  Heart valve function.  Aneurysm detection.  Evidence of a past heart attack.  Fluid buildup around the heart.  Heart muscle thickening.  Assess heart valve function. Tell a health care provider about:  Any allergies you have.  All medicines you are taking, including vitamins, herbs, eye drops, creams, and over-the-counter medicines.  Any problems you or family members have had with anesthetic medicines.  Any blood disorders you have.  Any surgeries you have had.  Any medical conditions you have.  Whether you are pregnant or may be pregnant. What happens before the procedure? No special preparation is needed. Eat and drink normally. What happens during the procedure?  In order to produce an image of your heart, gel will be applied to your chest and a wand-like tool (transducer) will be moved over your chest. The gel will help transmit the sound waves from the transducer. The sound waves will harmlessly bounce off your heart to allow the heart images to be captured in real-time motion. These images will then be recorded.  You may need an IV to receive a medicine that improves the quality of the pictures. What happens after the procedure? You may return to your normal schedule including diet, activities, and medicines, unless your health care provider tells you otherwise. This information is not intended to  replace advice given to you by your health care provider. Make sure you discuss any questions you have with your health care provider. Document Released: 02/02/2000 Document Revised: 09/23/2015 Document Reviewed: 10/12/2012 Elsevier Interactive Patient Education  2017 [employer].    If you need a refill on your cardiac medications before your next appointment, please call your pharmacy.  Signed, Angelena Form, PA-C  03/26/2016 2:58 PM    [facility] [address], [place], [place]  [address] Phone: [phone]; Fax: [phone]

## 2016-03-26 NOTE — Patient Instructions (Addendum)
Medication Instructions:  Your physician recommends that you continue on your current medications as directed. Please refer to the Current Medication list given to you today.  Labwork: TODAY:  CRP & ESR  Testing/Procedures: Your physician has requested that you have an echocardiogram. Echocardiography is a painless test that uses sound waves to create images of your heart. It provides your doctor with information about the size and shape of your heart and how well your heart's chambers and valves are working. This procedure takes approximately one hour. There are no restrictions for this procedure.   Follow-Up: Your physician recommends that you schedule a follow-up appointment in: [place], PA-C   Any Other Special Instructions Will Be Listed Below (If Applicable). Echocardiogram An echocardiogram, or echocardiography, uses sound waves (ultrasound) to produce an image of your heart. The echocardiogram is simple, painless, obtained within a short period of time, and offers valuable information to your health care provider. The images from an echocardiogram can provide information such as:  Evidence of coronary artery disease (CAD).  Heart size.  Heart muscle function.  Heart valve function.  Aneurysm detection.  Evidence of a past heart attack.  Fluid buildup around the heart.  Heart muscle thickening.  Assess heart valve function. Tell a health care provider about:  Any allergies you have.  All medicines you are taking, including vitamins, herbs, eye drops, creams, and over-the-counter medicines.  Any problems you or family members have had with anesthetic medicines.  Any blood disorders you have.  Any surgeries you have had.  Any medical conditions you have.  Whether you are pregnant or may be pregnant. What happens before the procedure? No special preparation is needed. Eat and drink normally. What happens during the procedure?  In order to  produce an image of your heart, gel will be applied to your chest and a wand-like tool (transducer) will be moved over your chest. The gel will help transmit the sound waves from the transducer. The sound waves will harmlessly bounce off your heart to allow the heart images to be captured in real-time motion. These images will then be recorded.  You may need an IV to receive a medicine that improves the quality of the pictures. What happens after the procedure? You may return to your normal schedule including diet, activities, and medicines, unless your health care provider tells you otherwise. This information is not intended to replace advice given to you by your health care provider. Make sure you discuss any questions you have with your health care provider. Document Released: 02/02/2000 Document Revised: 09/23/2015 Document Reviewed: 10/12/2012 Elsevier Interactive Patient Education  2017 [employer].    If you need a refill on your cardiac medications before your next appointment, please call your pharmacy.

## 2016-03-27 LAB — SPECIMEN STATUS

## 2016-03-27 LAB — C-REACTIVE PROTEIN: CRP: 6 mg/L — ABNORMAL HIGH (ref 0.0–4.9)

## 2016-03-28 ENCOUNTER — Other Ambulatory Visit: Payer: BLUE CROSS/BLUE SHIELD

## 2016-03-28 ENCOUNTER — Telehealth: Payer: Self-pay | Admitting: Physician Assistant

## 2016-03-28 NOTE — Telephone Encounter (Signed)
-----   Message from Eileen Stanford, [place] sent at 03/27/2016  5:56 PM EST ----- Not sure why CBC ordered but it was normal. CRP (inflammatory marker) is elevated and it was normal 3 months ago. This is consistent with inflammation around heart sac. I want to wait for echo before deciding on treatment plan.

## 2016-03-28 NOTE — Telephone Encounter (Signed)
New Message     Please call she was returning [facility] call

## 2016-03-28 NOTE — Telephone Encounter (Signed)
Returned pts call and she has been made aware of her lab results and she verbalized understanding. 

## 2016-03-29 LAB — CBC WITH DIFFERENTIAL/PLATELET
Basophils Absolute: 0 10*3/uL (ref 0.0–0.2)
Basos: 1 %
EOS (ABSOLUTE): 0.2 10*3/uL (ref 0.0–0.4)
Eos: 3 %
Hematocrit: 38.9 % (ref 34.0–46.6)
Hemoglobin: 12.8 g/dL (ref 11.1–15.9)
Immature Grans (Abs): 0 10*3/uL (ref 0.0–0.1)
Immature Granulocytes: 0 %
Lymphocytes Absolute: 2.6 10*3/uL (ref 0.7–3.1)
Lymphs: 43 %
MCH: 28.5 pg (ref 26.6–33.0)
MCHC: 32.9 g/dL (ref 31.5–35.7)
MCV: 87 fL (ref 79–97)
Monocytes Absolute: 0.3 10*3/uL (ref 0.1–0.9)
Monocytes: 6 %
Neutrophils Absolute: 2.9 10*3/uL (ref 1.4–7.0)
Neutrophils: 47 %
Platelets: 254 10*3/uL (ref 150–379)
RBC: 4.49 x10E6/uL (ref 3.77–5.28)
RDW: 13.1 % (ref 12.3–15.4)
WBC: 6 10*3/uL (ref 3.4–10.8)

## 2016-03-29 LAB — SPECIMEN STATUS REPORT

## 2016-04-02 ENCOUNTER — Other Ambulatory Visit: Payer: Self-pay | Admitting: Physician Assistant

## 2016-04-02 DIAGNOSIS — M542 Cervicalgia: Secondary | ICD-10-CM | POA: Diagnosis not present

## 2016-04-02 DIAGNOSIS — I10 Essential (primary) hypertension: Secondary | ICD-10-CM | POA: Diagnosis not present

## 2016-04-03 ENCOUNTER — Encounter: Payer: Self-pay | Admitting: Internal Medicine

## 2016-04-03 ENCOUNTER — Ambulatory Visit (INDEPENDENT_AMBULATORY_CARE_PROVIDER_SITE_OTHER): Payer: BLUE CROSS/BLUE SHIELD | Admitting: Internal Medicine

## 2016-04-03 VITALS — BP 146/86 | HR 64 | Ht 60.0 in | Wt 199.4 lb

## 2016-04-03 DIAGNOSIS — R1013 Epigastric pain: Secondary | ICD-10-CM

## 2016-04-03 DIAGNOSIS — R634 Abnormal weight loss: Secondary | ICD-10-CM | POA: Diagnosis not present

## 2016-04-03 DIAGNOSIS — Z862 Personal history of diseases of the blood and blood-forming organs and certain disorders involving the immune mechanism: Secondary | ICD-10-CM

## 2016-04-03 DIAGNOSIS — K219 Gastro-esophageal reflux disease without esophagitis: Secondary | ICD-10-CM | POA: Diagnosis not present

## 2016-04-03 NOTE — Progress Notes (Signed)
Subjective:    Patient ID: Gabriela Lowe, female    DOB: [birth year], 51 y.o.   MRN: [id number]  HPI Gabriela Lowe is a 51 year old female with a history of GERD, dyspepsia, upper abdominal bloating with abdominal pain, iron deficiency anemia who is here for follow-up. She was seen by Alonza Bogus, PA-C in October 2017 to evaluate nausea, epigastric pain and bloating and reflux symptoms. At that time she was on PPI therapy and upper endoscopy was arranged.  EGD was performed on 12/28/2015. This showed a normal esophagus, 2 cm hiatal hernia and a normal stomach and duodenum. Biopsies from the small bowel were normal and biopsies from the stomach were normal. Due to persistent iron deficiency anemia video capsule endoscopy was repeated. This was unremarkable except for mild duodenal erythema and a small lymphangiectasia. Gastric empty study was normal. Ultrasound of the abdomen was unremarkable.  She reports that her GI symptoms have been overall much less of a problem. She's having some mild morning nausea on occasion but no vomiting. Her reflux has settled down as has her gas, bloating and belching. No trouble swallowing. She is off of all reflux medication at present. Stools are regular and daily without blood or melena. Occasionally hemorrhoids bother her which responded Preparation H. She denies abdominal pain. No fevers or chills. She has continued oral iron 3 times daily. Her blood counts have normalized.  New and troubling for her is joint pains, multiple joints including shoulders hips and neck. She's lost 27 pounds. She's also having some bilateral rib pain. She is following with rheumatology tomorrow Dr. Norva Riffle. She had previously seen rheumatology more than a year ago it that point the evaluation was negative.  She also was found to have a pericardial effusion which is being evaluated by cardiology.   Review of Systems As per history of present illness, otherwise negative  Current  Medications, Allergies, Past Medical History, Past Surgical History, Family History and Social History were reviewed in [employer] record.     Objective:   Physical Exam BP (!) 146/86 (BP Location: Left Arm, Patient Position: Sitting, Cuff Size: Normal)   Pulse 64   Ht 5' (1.524 m) Comment: height measured without shoes  Wt 199 lb 6 oz (90.4 kg)   BMI 38.94 kg/m  Constitutional: Well-developed and well-nourished. No distress. HEENT: Normocephalic and atraumatic.  Conjunctivae are normal.  No scleral icterus. Neck: Neck supple. Trachea midline. Cardiovascular: Normal rate, regular rhythm and intact distal pulses. No M/R/G Pulmonary/chest: Effort normal and breath sounds normal. No wheezing, rales or rhonchi. Abdominal: Soft, nontender, nondistended. Bowel sounds active throughout.  Extremities: no clubbing, cyanosis, or edema Neurological: Alert and oriented to person place and time. Skin: Skin is warm and dry.  Psychiatric: Normal mood and affect. Behavior is normal.  CBC    Component Value Date/Time   WBC WILL FOLLOW 03/26/2016 1512   WBC 6.0 03/26/2016 1512   WBC 6.8 03/05/2016 1908   RBC WILL FOLLOW 03/26/2016 1512   RBC 4.49 03/26/2016 1512   RBC 4.18 03/05/2016 1908   HGB 12.0 03/05/2016 1908   HCT WILL FOLLOW 03/26/2016 1512   HCT 38.9 03/26/2016 1512   PLT WILL FOLLOW 03/26/2016 1512   PLT 254 03/26/2016 1512   MCV WILL FOLLOW 03/26/2016 1512   MCV 87 03/26/2016 1512   MCH WILL FOLLOW 03/26/2016 1512   MCH 28.5 03/26/2016 1512   MCH 28.7 03/05/2016 1908   MCHC WILL FOLLOW 03/26/2016 1512  MCHC 32.9 03/26/2016 1512   MCHC 32.8 03/05/2016 1908   RDW WILL FOLLOW 03/26/2016 1512   RDW 13.1 03/26/2016 1512   LYMPHSABS WILL FOLLOW 03/26/2016 1512   LYMPHSABS 2.6 03/26/2016 1512   MONOABS 0.3 10/31/2015 1712   EOSABS WILL FOLLOW 03/26/2016 1512   EOSABS 0.2 03/26/2016 1512   BASOSABS WILL FOLLOW 03/26/2016 1512   BASOSABS 0.0 03/26/2016  1512   CMP     Component Value Date/Time   NA 140 03/05/2016 1908   K 3.7 03/05/2016 1908   CL 106 03/05/2016 1908   CO2 29 03/05/2016 1908   GLUCOSE 116 (H) 03/05/2016 1908   BUN 8 03/05/2016 1908   CREATININE 1.12 (H) 03/05/2016 1908   CALCIUM 9.4 03/05/2016 1908   PROT 8.0 12/18/2015 1635   ALBUMIN 4.3 12/18/2015 1635   AST 24 12/18/2015 1635   ALT 15 12/18/2015 1635   ALKPHOS 69 12/18/2015 1635   BILITOT 0.9 12/18/2015 1635   GFRNONAA 56 (L) 03/05/2016 1908   GFRAA >60 03/05/2016 1908        Assessment & Plan:  51 year old female with a history of GERD, dyspepsia, upper abdominal bloating with abdominal pain, iron deficiency anemia who is here for follow-up.  1. GERD/bloating/belching -- overall improvement in symptoms. She is trying to eat a low gas and healthy diet. Not currently on therapy. GI evaluation has been negative after upper endoscopy, video capsule endoscopy 2 and colonoscopy. Imaging including gastric imaging study, ultrasound and CT also unrevealing. If symptoms recur consider testing for small intestinal bacterial overgrowth.  2. Iron deficiency anemia -- unrevealing GI evaluation. She has responded to oral iron and hemoglobin has normalized. Hemoglobin and iron studies to be followed by primary care.  3. Weight loss and polyarthritis symptoms -- rheumatology consultation pending for later this week.  4. Pericardial thickening/effusion -- being evaluated by cardiology but likely related to inflammatory, possibly autoimmune condition which is yet to be diagnosed.  Follow up with GI as needed 25 minutes spent with the patient today. Greater than 50% was spent in counseling and coordination of care with the patient

## 2016-04-03 NOTE — Patient Instructions (Signed)
Please purchase the following medications over the counter and take as directed: Zantac 150 mg twice daily as needed  Continue your oral iron supplement.  Follow up as needed.  If you are age 51 or older, your body mass index should be between 23-30. Your Body mass index is 38.94 kg/m. If this is out of the aforementioned range listed, please consider follow up with your Primary Care Provider.  If you are age 41 or younger, your body mass index should be between 19-25. Your Body mass index is 38.94 kg/m. If this is out of the aformentioned range listed, please consider follow up with your Primary Care Provider.

## 2016-04-04 DIAGNOSIS — G4762 Sleep related leg cramps: Secondary | ICD-10-CM | POA: Diagnosis not present

## 2016-04-04 DIAGNOSIS — M7532 Calcific tendinitis of left shoulder: Secondary | ICD-10-CM | POA: Diagnosis not present

## 2016-04-04 DIAGNOSIS — M545 Low back pain: Secondary | ICD-10-CM | POA: Diagnosis not present

## 2016-04-04 DIAGNOSIS — F5104 Psychophysiologic insomnia: Secondary | ICD-10-CM | POA: Diagnosis not present

## 2016-04-04 DIAGNOSIS — R634 Abnormal weight loss: Secondary | ICD-10-CM | POA: Diagnosis not present

## 2016-04-04 LAB — SEDIMENTATION RATE: Sed Rate: 14 mm/hr (ref 0–40)

## 2016-04-05 ENCOUNTER — Other Ambulatory Visit: Payer: Self-pay | Admitting: Otolaryngology

## 2016-04-05 DIAGNOSIS — M542 Cervicalgia: Secondary | ICD-10-CM

## 2016-04-08 NOTE — Progress Notes (Signed)
 Cardiology Office Note    Date:  04/10/2016   ID:  Gabriela Lowe, DOB 12/28/1965, MRN [id number]  PCP:  Dorothy Scifres, PA-C  Cardiologist:  New (discussed case with DOD, Dr. Camnitz)   CC: follow up of chest pain  History of Present Illness:  Gabriela Lowe is a 50 y.o. female with a history of arthritis, DMT2, GERD, HTN, and Schatzki's ring who presents to clinic for new patient evaluation of chest pain.  She is followed by [facility]: prior CT abdomen showed no acute abdominal pathology, and was reevaluated on 10/11 for persistent nausea, belching, and epigastric pain. Gastric emptying study performed on 12/12/14 was normal.   Chest CT in 01/2016 showed some mild pericardial thickening questioning possible pericarditis. No coronary calcification noted.   She was seen in the ER on 12/18/15 for abdominal pain and again 03/05/16 for chest pain. Delta troponin negative and ECG with no evidence of ischemia or pericarditis and she was discharged home.   I saw her in clinic on 03/26/16 for evaluation of chest pain. I felt like this was most c/w pericarditis. Subsequent ESR was elevated and 2D ECHO showed normal function (EF 60-65%), mod LAE, mild TR. No evidence of pericarditis on echo.   Today she presents to clinic for follow up. She continues to have a pulling sensation that radiates into her neck and shoulders. She notices it most in the AM when she gets up. Not related exertion. No SOB. No LE edema, orthopnea or PND. No dizziness or syncope. No blood in stool or urine. Occasional palpitations. She walks regularly with no chest pain or SOB.     Past Medical History:  Diagnosis Date  . Arthritis    neck & back   . Bronchitis   . Diabetes mellitus without complication (HCC)   . External hemorrhoid   . GERD (gastroesophageal reflux disease)   . Headache   . Hiatal hernia   . Hypertension   . Iron deficiency anemia    taking Fe TID, last hgb. 11.2, per pt.   . Numbness and  tingling   . Schatzki's ring     Past Surgical History:  Procedure Laterality Date  . BREAST LUMPECTOMY WITH RADIOACTIVE SEED LOCALIZATION Right 09/11/2015   Procedure: RIGHT BREAST LUMPECTOMY WITH RADIOACTIVE SEED LOCALIZATION;  Surgeon: Douglas Blackman, MD;  Location: [facility] OR;  Service: General;  Laterality: Right;  . CESAREAN SECTION     x 2  . CHOLECYSTECTOMY    . TUBAL LIGATION      Current Medications: Outpatient Medications Prior to Visit  Medication Sig Dispense Refill  . Cholecalciferol (VITAMIN D3) 5000 units CAPS Take 1 capsule by mouth daily.    . ferrous sulfate 325 (65 FE) MG tablet Take 325 mg by mouth 3 (three) times daily.    . fluticasone (FLONASE) 50 MCG/ACT nasal spray Place 2 sprays into both nostrils daily.    . gabapentin (NEURONTIN) 300 MG capsule Take 300 mg by mouth every evening.    . hyoscyamine (LEVSIN SL) 0.125 MG SL tablet Take 1-2 tablet by mouth every 4-6 hours as needed. (Patient taking differently: Take 0.125 mg by mouth every 4 (four) hours as needed for cramping. Take 1-2 tablet by mouth every 4-6 hours as needed.) 50 tablet 2  . lisinopril-hydrochlorothiazide (PRINZIDE,ZESTORETIC) 20-12.5 MG tablet Take 1 tablet by mouth daily after lunch.   0  . metFORMIN (GLUCOPHAGE) 500 MG tablet Take 500 mg by mouth every evening.  5  .   Multiple Vitamin (MULTIVITAMIN WITH MINERALS) TABS tablet Take 1 tablet by mouth daily.    . ondansetron (ZOFRAN ODT) 4 MG disintegrating tablet Take 1 tablet (4 mg total) by mouth every 8 (eight) hours as needed for nausea or vomiting. 20 tablet 0  . VITAMIN E PO Take 1 capsule by mouth daily.      Facility-Administered Medications Prior to Visit  Medication Dose Route Frequency Provider Last Rate Last Dose  . 0.9 %  sodium chloride infusion  500 mL Intravenous Continuous Jerene Bears, MD         Allergies:   Tramadol   Social History   Social History  . Marital status: Single    Spouse name: N/A  . Number of children: 2   . Years of education: 3 years college   Occupational History  . Sales Associate    Social History Main Topics  . Smoking status: Never Smoker  . Smokeless tobacco: Never Used  . Alcohol use No  . Drug use: No  . Sexual activity: Not Asked   Other Topics Concern  . None   Social History Narrative   Lives at home with her son and her partner, [name].   Right-handed.   No caffeine use.     Family History:  The patient's family history includes Congestive Heart Failure in her father; Diabetes in her brother, father, and mother; Hypertension in her mother.      ROS:   Please see the history of present illness.    ROS All other systems reviewed and are negative.   PHYSICAL EXAM:    VS:  BP 108/78   Pulse 62   Ht 5' 5" (1.651 m)   Wt 197 lb 12.8 oz (89.7 kg)   SpO2 98%   BMI 32.92 kg/m    GEN: Well nourished, well developed, in no acute distress  HEENT: normal  Neck: no JVD, carotid bruits, or masses Cardiac: RRR; no murmurs, rubs, or gallops,no edema  Respiratory:  clear to auscultation bilaterally, normal work of breathing GI: soft, nontender, nondistended, + BS MS: no deformity or atrophy  Skin: warm and dry, no rash Neuro:  Alert and Oriented x 3, Strength and sensation are intact Psych: euthymic mood, full affect   Wt Readings from Last 3 Encounters:  04/10/16 197 lb 12.8 oz (89.7 kg)  04/03/16 199 lb 6 oz (90.4 kg)  03/26/16 196 lb 9.6 oz (89.2 kg)      Studies/Labs Reviewed:   EKG:  EKG is NOT ordered today.   Recent Labs: 07/14/2015: B Natriuretic Peptide 363.6 11/27/2015: TSH 0.616 12/18/2015: ALT 15 03/05/2016: BUN 8; Creatinine, Ser 1.12; Hemoglobin 12.0; Potassium 3.7; Sodium 140 03/26/2016: Platelets WILL FOLLOW; Platelets 254   Lipid Panel No results found for: CHOL, TRIG, HDL, CHOLHDL, VLDL, LDLCALC, LDLDIRECT  Additional studies/ records that were reviewed today include:  CT Chest 01/23/16 IMPRESSION: Mild pericardial thickening  anteriorly, question pericarditis. Otherwise negative  2D ECHO: 04/09/2016 LV EF: 60% -   65% Study Conclusions - Left ventricle: The cavity size was normal. There was mild focal   basal hypertrophy of the septum. Systolic function was normal.   The estimated ejection fraction was in the range of 60% to 65%.   Wall motion was normal; there were no regional wall motion   abnormalities. Left ventricular diastolic function parameters   were normal. - Aortic valve: Transvalvular velocity was within the normal range.   There was no stenosis. There was no regurgitation. -  Mitral valve: Transvalvular velocity was within the normal range.   There was no evidence for stenosis. There was trivial   regurgitation. - Left atrium: The atrium was moderately dilated. - Right ventricle: The cavity size was normal. Wall thickness was   normal. Systolic function was normal. - Tricuspid valve: There was mild regurgitation. - Pulmonary arteries: Systolic pressure was within the normal   range. PA peak pressure: 36 mm Hg (S).  ASSESSMENT & PLAN:   Chest pain: still continues to have chest pain but now feels more like a throat pulling and less in her chest. 2D ECHO showed normal LV function with no pericardial effusion. CRP was elevated, but this is non specific. ECG not consistent with pericarditis. Will plan to try colchicine 0.6mg BID and Ibuprofen 600mg TID x 1 week empirically for pericarditis. . If this really helps her symptoms I will continue therapy. Otherwise, we will stop and she will continue work up with her PCP and gastroenterologist.   HTN: BP well controlled.   DMT2:  HgA1c 6.3. Continue current regimen   GERD: continue follow up with GI  Medication Adjustments/Labs and Tests Ordered: Current medicines are reviewed at length with the patient today.  Concerns regarding medicines are outlined above.  Medication changes, Labs and Tests ordered today are listed in the Patient Instructions  below. Patient Instructions  Medication Instructions:  Your physician has recommended you make the following change in your medication:  1.  START Colchiine 0.6 mg taking 1 tablet twice a day X's 2 weeks 2.  START the Ibuprofen 600 mg taking 1 tablet three times a day X's 2 weeks   Labwork: None ordered  Testing/Procedures: None ordered  Follow-Up: Your physician recommends that you schedule a follow-up appointment in: AS NEEDED   Any Other Special Instructions Will Be Listed Below (If Applicable).     If you need a refill on your cardiac medications before your next appointment, please call your pharmacy.      Signed,  , PA-C  04/10/2016 4:21 PM    [facility] [address], [place], [place]  [address] Phone: [phone]; Fax: [phone]    

## 2016-04-09 ENCOUNTER — Other Ambulatory Visit: Payer: Self-pay

## 2016-04-09 ENCOUNTER — Ambulatory Visit (HOSPITAL_COMMUNITY): Payer: BLUE CROSS/BLUE SHIELD | Attending: Cardiovascular Disease

## 2016-04-09 DIAGNOSIS — I10 Essential (primary) hypertension: Secondary | ICD-10-CM | POA: Insufficient documentation

## 2016-04-09 DIAGNOSIS — R079 Chest pain, unspecified: Secondary | ICD-10-CM | POA: Insufficient documentation

## 2016-04-09 DIAGNOSIS — I071 Rheumatic tricuspid insufficiency: Secondary | ICD-10-CM | POA: Insufficient documentation

## 2016-04-09 DIAGNOSIS — D649 Anemia, unspecified: Secondary | ICD-10-CM | POA: Diagnosis not present

## 2016-04-10 ENCOUNTER — Ambulatory Visit (INDEPENDENT_AMBULATORY_CARE_PROVIDER_SITE_OTHER): Payer: BLUE CROSS/BLUE SHIELD | Admitting: Physician Assistant

## 2016-04-10 ENCOUNTER — Encounter: Payer: Self-pay | Admitting: Physician Assistant

## 2016-04-10 VITALS — BP 108/78 | HR 62 | Ht 65.0 in | Wt 197.8 lb

## 2016-04-10 DIAGNOSIS — I10 Essential (primary) hypertension: Secondary | ICD-10-CM | POA: Diagnosis not present

## 2016-04-10 DIAGNOSIS — R079 Chest pain, unspecified: Secondary | ICD-10-CM

## 2016-04-10 DIAGNOSIS — E118 Type 2 diabetes mellitus with unspecified complications: Secondary | ICD-10-CM | POA: Diagnosis not present

## 2016-04-10 MED ORDER — IBUPROFEN 600 MG PO TABS: 600.0000 mg | ORAL_TABLET | Freq: Three times a day (TID) | ORAL | 0 refills | Status: DC

## 2016-04-10 MED ORDER — COLCHICINE 0.6 MG PO TABS: 0.6000 mg | ORAL_TABLET | Freq: Two times a day (BID) | ORAL | 0 refills | Status: DC

## 2016-04-10 NOTE — Patient Instructions (Signed)
Medication Instructions:  Your physician has recommended you make the following change in your medication:  1.  START Colchiine 0.6 mg taking 1 tablet twice a day X's 2 weeks 2.  START the Ibuprofen 600 mg taking 1 tablet three times a day X's 2 weeks   Labwork: None ordered  Testing/Procedures: None ordered  Follow-Up: Your physician recommends that you schedule a follow-up appointment in: AS NEEDED   Any Other Special Instructions Will Be Listed Below (If Applicable).     If you need a refill on your cardiac medications before your next appointment, please call your pharmacy.

## 2016-04-11 ENCOUNTER — Ambulatory Visit
Admission: RE | Admit: 2016-04-11 | Discharge: 2016-04-11 | Disposition: A | Discharge status: Home / Self Care | Payer: BLUE CROSS/BLUE SHIELD | Source: Ambulatory Visit | Attending: Otolaryngology | Admitting: Otolaryngology

## 2016-04-11 DIAGNOSIS — M542 Cervicalgia: Secondary | ICD-10-CM | POA: Diagnosis not present

## 2016-04-11 MED ORDER — IOPAMIDOL (ISOVUE-300) INJECTION 61%
75.0000 mL | Freq: Once | INTRAVENOUS | Status: AC | PRN
  Administered 2016-04-11: 75 mL via INTRAVENOUS

## 2016-04-23 DIAGNOSIS — Z5181 Encounter for therapeutic drug level monitoring: Secondary | ICD-10-CM | POA: Diagnosis not present

## 2016-04-23 DIAGNOSIS — M2550 Pain in unspecified joint: Secondary | ICD-10-CM | POA: Diagnosis not present

## 2016-04-23 DIAGNOSIS — L659 Nonscarring hair loss, unspecified: Secondary | ICD-10-CM | POA: Diagnosis not present

## 2016-04-23 DIAGNOSIS — R634 Abnormal weight loss: Secondary | ICD-10-CM | POA: Diagnosis not present

## 2016-04-25 DIAGNOSIS — R634 Abnormal weight loss: Secondary | ICD-10-CM | POA: Diagnosis not present

## 2016-04-25 DIAGNOSIS — Z79899 Other long term (current) drug therapy: Secondary | ICD-10-CM | POA: Diagnosis not present

## 2016-04-25 DIAGNOSIS — M2550 Pain in unspecified joint: Secondary | ICD-10-CM | POA: Diagnosis not present

## 2016-04-25 DIAGNOSIS — L659 Nonscarring hair loss, unspecified: Secondary | ICD-10-CM | POA: Diagnosis not present

## 2016-05-07 DIAGNOSIS — M542 Cervicalgia: Secondary | ICD-10-CM | POA: Diagnosis not present

## 2016-05-07 DIAGNOSIS — M2550 Pain in unspecified joint: Secondary | ICD-10-CM | POA: Diagnosis not present

## 2016-05-07 DIAGNOSIS — M7532 Calcific tendinitis of left shoulder: Secondary | ICD-10-CM | POA: Diagnosis not present

## 2016-05-07 DIAGNOSIS — M545 Low back pain: Secondary | ICD-10-CM | POA: Diagnosis not present

## 2016-05-08 ENCOUNTER — Encounter: Payer: Self-pay | Admitting: Adult Health

## 2016-05-08 ENCOUNTER — Ambulatory Visit (INDEPENDENT_AMBULATORY_CARE_PROVIDER_SITE_OTHER): Payer: BLUE CROSS/BLUE SHIELD | Admitting: Adult Health

## 2016-05-08 VITALS — BP 131/76 | HR 63 | Ht 65.0 in | Wt 196.6 lb

## 2016-05-08 DIAGNOSIS — R202 Paresthesia of skin: Secondary | ICD-10-CM | POA: Diagnosis not present

## 2016-05-08 DIAGNOSIS — M542 Cervicalgia: Secondary | ICD-10-CM

## 2016-05-08 DIAGNOSIS — R200 Anesthesia of skin: Secondary | ICD-10-CM | POA: Diagnosis not present

## 2016-05-08 DIAGNOSIS — R9389 Abnormal findings on diagnostic imaging of other specified body structures: Secondary | ICD-10-CM

## 2016-05-08 DIAGNOSIS — R938 Abnormal findings on diagnostic imaging of other specified body structures: Secondary | ICD-10-CM

## 2016-05-08 NOTE — Progress Notes (Signed)
I have reviewed and agreed above plan. 

## 2016-05-08 NOTE — Progress Notes (Signed)
PATIENT: Gabriela Lowe DOB: 12-13-1965  REASON FOR VISIT: follow up- paresthesia HISTORY FROM: patient  HISTORY OF PRESENT ILLNESS: HISTORY per Dr Rhea Belton notes: Gabriela Lowe is a 51 years old left-handed female, accompanied by her partner [name], seen in refer by emergency room for evaluation of paresthesia involving right side of her body, bilateral hands, feet, her primary care physician is [place]  Maude Leriche, initial evaluation was November 27 2015  She had a history of iron deficiency anemia on iron supplement, hypertension, she complains of numbness tingling involving bilateral hands and feet since September 2017, she works at [employer], standing on her feet up to 8 hours each day, she denies gait abnormality, no bowel and bladder incontinence,  But since September 2017, each morning she woke up and noticed bilateral hands numbness tingling, at nighttime, she often feels bilateral feet numbness tingling, intermittent, no significant worsening,  I reviewed and summarized her most recent ER visit, October 31 2015 for chest pain, November 04 2015 for paresthesia involving 4 extremities  Laboratory evaluation in September 2017, negative troponin, normal CBC with hemoglobin of 14.2, BMP showed elevated creatinine 1.37, with GFR 52, glucose was 122, HIV was negative in February 2017  I personally reviewed MRI lumbar in September 2017, mild degenerative disc disease most severe at L4-5, L5-S1, no significant canal or foraminal stenosis,   CAT scan of the brain showed no significant abnormality, MRI DWI was negative no evidence of acute stroke   Today 05/08/2016  Gabriela Lowe is a 51 year old female with a history of paresthesias in the hands. She returns today for follow-up. She states that she has been having new numbness that located in the upper arms bilaterally, the back of the neck and the lower part of the face bilaterally. She states that this started about 3 weeks  ago. She states that she also has been having significant neck pain. Her ENT ordered CT of the neck that showed possible herniated disc and was referred to neurosurgery. She states that she has an appointment with neurosurgery April 5. She denies any weakness in the arms or legs. Denies any changes with her speech. Denies any difficulty swallowing. Denies any changes with her gait or balance. Denies any pain associated with her numbness. She states that the numbness is intermittent. She states that when it occurs it only last for several seconds and then resolves. She has not noticed any movement that exacerbates her numbness. Denies any significant stress. She returns today for an evaluation.   REVIEW OF SYSTEMS: Out of a complete 14 system review of symptoms, the patient complains only of the following symptoms, and all other reviewed systems are negative.  Eye pain, unexpected weight change, ear pain, runny nose, joint pain, back pain, aching muscles, neck pain, neck stiffness, dizziness, headache, numbness  ALLERGIES: Allergies  Allergen Reactions  . Tramadol Nausea Only and Other (See Comments)    Pt states makes her groggy and nauseous    HOME MEDICATIONS: Outpatient Medications Prior to Visit  Medication Sig Dispense Refill  . Cholecalciferol (VITAMIN D3) 5000 units CAPS Take 1 capsule by mouth daily.    . cyclobenzaprine (FLEXERIL) 10 MG tablet Take 1 tablet by mouth daily as needed.  1  . ferrous sulfate 325 (65 FE) MG tablet Take 325 mg by mouth 3 (three) times daily.    . fluticasone (FLONASE) 50 MCG/ACT nasal spray Place 2 sprays into both nostrils daily.    Marland Kitchen gabapentin (NEURONTIN) 300  MG capsule Take 300 mg by mouth every evening.    . hyoscyamine (LEVSIN SL) 0.125 MG SL tablet Take 1-2 tablet by mouth every 4-6 hours as needed. (Patient taking differently: Take 0.125 mg by mouth every 4 (four) hours as needed for cramping. Take 1-2 tablet by mouth every 4-6 hours as needed.) 50  tablet 2  . lisinopril-hydrochlorothiazide (PRINZIDE,ZESTORETIC) 20-12.5 MG tablet Take 1 tablet by mouth daily after lunch.   0  . metFORMIN (GLUCOPHAGE) 500 MG tablet Take 500 mg by mouth every evening.  5  . Multiple Vitamin (MULTIVITAMIN WITH MINERALS) TABS tablet Take 1 tablet by mouth daily.    . ondansetron (ZOFRAN ODT) 4 MG disintegrating tablet Take 1 tablet (4 mg total) by mouth every 8 (eight) hours as needed for nausea or vomiting. 20 tablet 0  . colchicine (COLCRYS) 0.6 MG tablet Take 1 tablet (0.6 mg total) by mouth 2 (two) times daily. (Patient not taking: Reported on 05/08/2016) 28 tablet 0  . ibuprofen (ADVIL,MOTRIN) 600 MG tablet Take 1 tablet (600 mg total) by mouth 3 (three) times daily. 21 tablet 0   Facility-Administered Medications Prior to Visit  Medication Dose Route Frequency Provider Last Rate Last Dose  . 0.9 %  sodium chloride infusion  500 mL Intravenous Continuous Jerene Bears, MD        PAST MEDICAL HISTORY: Past Medical History:  Diagnosis Date  . Arthritis    neck & back   . Bronchitis   . Diabetes mellitus without complication ([place])   . External hemorrhoid   . GERD (gastroesophageal reflux disease)   . Headache   . Hiatal hernia   . Hypertension   . Iron deficiency anemia    taking Fe TID, last hgb. 11.2, per pt.   . Numbness and tingling   . Schatzki's ring     PAST SURGICAL HISTORY: Past Surgical History:  Procedure Laterality Date  . BREAST LUMPECTOMY WITH RADIOACTIVE SEED LOCALIZATION Right 09/11/2015   Procedure: RIGHT BREAST LUMPECTOMY WITH RADIOACTIVE SEED LOCALIZATION;  Surgeon: Coralie Keens, MD;  Location: [facility];  Service: General;  Laterality: Right;  . CESAREAN SECTION     x 2  . CHOLECYSTECTOMY    . TUBAL LIGATION      FAMILY HISTORY: Family History  Problem Relation Age of Onset  . Hypertension Mother   . Diabetes Mother   . Diabetes Father   . Congestive Heart Failure Father   . Diabetes Brother   . Colon cancer Neg  Hx     SOCIAL HISTORY: Social History   Social History  . Marital status: Single    Spouse name: N/A  . Number of children: 2  . Years of education: 3 years college   Occupational History  . Sales Associate    Social History Main Topics  . Smoking status: Never Smoker  . Smokeless tobacco: Never Used  . Alcohol use No  . Drug use: No  . Sexual activity: Not on file   Other Topics Concern  . Not on file   Social History Narrative   Lives at home with her son and her partner, [name].   Right-handed.   No caffeine use.      PHYSICAL EXAM  Vitals:   05/08/16 1456  BP: 131/76  Pulse: 63  Weight: 196 lb 9.6 oz (89.2 kg)  Height: 5\' 5"  (1.651 m)   Body mass index is 32.72 kg/m.  Generalized: Well developed, in no acute distress   Neurological  examination  Mentation: Alert oriented to time, place, history taking. Follows all commands speech and language fluent Cranial nerve II-XII: Pupils were equal round reactive to light. Extraocular movements were full, visual field were full on confrontational test. Facial sensation and strength were normal. Uvula tongue midline. Head turning and shoulder shrug  were normal and symmetric. Motor: The motor testing reveals 5 over 5 strength of all 4 extremities. Good symmetric motor tone is noted throughout.  Sensory: Sensory testing is intact to soft touch on all 4 extremities. No evidence of extinction is noted.  Coordination: Cerebellar testing reveals good finger-nose-finger and heel-to-shin bilaterally.  Gait and station: Gait is normal. Tandem gait is normal. Romberg is negative. No drift is seen.  Reflexes: Deep tendon reflexes are symmetric and normal bilaterally.   DIAGNOSTIC DATA (LABS, IMAGING, TESTING) - I reviewed patient records, labs, notes, testing and imaging myself where available.  Lab Results  Component Value Date   WBC WILL FOLLOW 03/26/2016   WBC 6.0 03/26/2016   HGB 12.0 03/05/2016   HCT WILL FOLLOW  03/26/2016   HCT 38.9 03/26/2016   MCV WILL FOLLOW 03/26/2016   MCV 87 03/26/2016   PLT WILL FOLLOW 03/26/2016   PLT 254 03/26/2016      Component Value Date/Time   NA 140 03/05/2016 1908   K 3.7 03/05/2016 1908   CL 106 03/05/2016 1908   CO2 29 03/05/2016 1908   GLUCOSE 116 (H) 03/05/2016 1908   BUN 8 03/05/2016 1908   CREATININE 1.12 (H) 03/05/2016 1908   CALCIUM 9.4 03/05/2016 1908   PROT 8.0 12/18/2015 1635   ALBUMIN 4.3 12/18/2015 1635   AST 24 12/18/2015 1635   ALT 15 12/18/2015 1635   ALKPHOS 69 12/18/2015 1635   BILITOT 0.9 12/18/2015 1635   GFRNONAA 56 (L) 03/05/2016 1908   GFRAA >60 03/05/2016 1908   No results found for: CHOL, HDL, LDLCALC, LDLDIRECT, TRIG, CHOLHDL No results found for: HGBA1C Lab Results  Component Value Date   VITAMINB12 401 11/27/2015   Lab Results  Component Value Date   TSH 0.616 11/27/2015      ASSESSMENT AND PLAN 51 y.o. year old female  has a past medical history of Arthritis; Bronchitis; Diabetes mellitus without complication ([place]); External hemorrhoid; GERD (gastroesophageal reflux disease); Headache; Hiatal hernia; Hypertension; Iron deficiency anemia; Numbness and tingling; and Schatzki's ring. here with:  1. Numbness in the upper extremities bilaterally 2. Facial numbness 2. Neck pain  4. Abnormal CT scan of the neck  I consulted with Dr. Krista Blue. We will schedule the patient for an MRI of the cervical spine to look for spinal stenosis and nerve compression. The patient has had a previous abnormal CT scan of the neck and new findings of numbness in the upper extremities and facial numbness. Patient is amenable to this plan. She will follow-up in 3 months with Dr. Krista Blue.   Ward Givens, MSN, NP-C 05/08/2016, 4:08 PM [facility] [address], [place], [place] [address] [phone]

## 2016-05-08 NOTE — Patient Instructions (Signed)
MRI cervical spine If your symptoms worsen or you develop new symptoms please let us know.

## 2016-05-23 DIAGNOSIS — M4712 Other spondylosis with myelopathy, cervical region: Secondary | ICD-10-CM | POA: Diagnosis not present

## 2016-05-23 DIAGNOSIS — Z6832 Body mass index (BMI) 32.0-32.9, adult: Secondary | ICD-10-CM | POA: Diagnosis not present

## 2016-05-27 ENCOUNTER — Ambulatory Visit
Admission: RE | Admit: 2016-05-27 | Discharge: 2016-05-27 | Disposition: A | Discharge status: Home / Self Care | Payer: BLUE CROSS/BLUE SHIELD | Source: Ambulatory Visit | Attending: Adult Health | Admitting: Adult Health

## 2016-05-27 DIAGNOSIS — R9389 Abnormal findings on diagnostic imaging of other specified body structures: Secondary | ICD-10-CM

## 2016-05-27 DIAGNOSIS — R938 Abnormal findings on diagnostic imaging of other specified body structures: Secondary | ICD-10-CM | POA: Diagnosis not present

## 2016-05-27 DIAGNOSIS — M4802 Spinal stenosis, cervical region: Secondary | ICD-10-CM | POA: Diagnosis not present

## 2016-05-27 DIAGNOSIS — R200 Anesthesia of skin: Secondary | ICD-10-CM

## 2016-05-27 MED ORDER — GADOBENATE DIMEGLUMINE 529 MG/ML IV SOLN
20.0000 mL | Freq: Once | INTRAVENOUS | Status: AC | PRN
  Administered 2016-05-27: 20 mL via INTRAVENOUS

## 2016-05-29 DIAGNOSIS — M4712 Other spondylosis with myelopathy, cervical region: Secondary | ICD-10-CM | POA: Diagnosis not present

## 2016-05-30 ENCOUNTER — Encounter: Payer: Self-pay | Admitting: Neurology

## 2016-05-30 ENCOUNTER — Telehealth: Payer: Self-pay | Admitting: *Deleted

## 2016-05-30 NOTE — Telephone Encounter (Signed)
Called and spoke with pt about MRI results per MM,NP note. Pt verbalized understanding. She will call if she has any new or worsening sx. She will follow up in June s as planned.

## 2016-05-30 NOTE — Telephone Encounter (Signed)
-----   Message from Ward Givens, NP sent at 05/30/2016  4:20 PM EDT ----- Cervical MRI showed some stenosis but no nerve root compression. I consulted with Dr. Krista Blue and this would not be the cause of her symptoms. Please advise patient.

## 2016-05-31 ENCOUNTER — Telehealth: Payer: Self-pay | Admitting: Neurology

## 2016-05-31 NOTE — Telephone Encounter (Signed)
Please get her neurosurgeon record.

## 2016-07-05 DIAGNOSIS — M488X2 Other specified spondylopathies, cervical region: Secondary | ICD-10-CM | POA: Diagnosis not present

## 2016-07-05 DIAGNOSIS — M4712 Other spondylosis with myelopathy, cervical region: Secondary | ICD-10-CM | POA: Diagnosis not present

## 2016-07-05 DIAGNOSIS — M50021 Cervical disc disorder at C4-C5 level with myelopathy: Secondary | ICD-10-CM | POA: Diagnosis not present

## 2016-07-05 DIAGNOSIS — M5020 Other cervical disc displacement, unspecified cervical region: Secondary | ICD-10-CM | POA: Diagnosis not present

## 2016-07-08 ENCOUNTER — Telehealth: Payer: Self-pay | Admitting: Neurology

## 2016-07-08 NOTE — Telephone Encounter (Signed)
I was able to review neurosurgical record dated May 23 2016 by Dr. 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

## 2016-07-25 DIAGNOSIS — M4712 Other spondylosis with myelopathy, cervical region: Secondary | ICD-10-CM | POA: Diagnosis not present

## 2016-08-12 ENCOUNTER — Ambulatory Visit: Payer: BLUE CROSS/BLUE SHIELD | Admitting: Neurology

## 2016-10-01 ENCOUNTER — Encounter: Payer: Self-pay | Admitting: Neurology

## 2016-10-01 ENCOUNTER — Ambulatory Visit (INDEPENDENT_AMBULATORY_CARE_PROVIDER_SITE_OTHER): Payer: BLUE CROSS/BLUE SHIELD | Admitting: Neurology

## 2016-10-01 VITALS — BP 139/81 | HR 60 | Ht 65.0 in | Wt 196.0 lb

## 2016-10-01 DIAGNOSIS — IMO0002 Reserved for concepts with insufficient information to code with codable children: Secondary | ICD-10-CM | POA: Insufficient documentation

## 2016-10-01 DIAGNOSIS — R519 Headache, unspecified: Secondary | ICD-10-CM

## 2016-10-01 DIAGNOSIS — R51 Headache: Secondary | ICD-10-CM | POA: Diagnosis not present

## 2016-10-01 DIAGNOSIS — G43709 Chronic migraine without aura, not intractable, without status migrainosus: Secondary | ICD-10-CM | POA: Insufficient documentation

## 2016-10-01 DIAGNOSIS — R202 Paresthesia of skin: Secondary | ICD-10-CM | POA: Diagnosis not present

## 2016-10-01 DIAGNOSIS — G8929 Other chronic pain: Secondary | ICD-10-CM

## 2016-10-01 MED ORDER — GABAPENTIN 100 MG PO CAPS: 300.0000 mg | ORAL_CAPSULE | Freq: Three times a day (TID) | ORAL | 11 refills | Status: DC

## 2016-10-01 NOTE — Progress Notes (Signed)
PATIENT: Gabriela Lowe DOB: 1965/06/16  REASON FOR VISIT: follow up- paresthesia HISTORY FROM: patient  HISTORY OF PRESENT ILLNESS: HISTORY per Dr Rhea Belton notes: Gabriela Lowe is a 51 years old left-handed female, accompanied by her partner [name], seen in refer by emergency room for evaluation of paresthesia involving right side of her body, bilateral hands, feet, her primary care physician is [place]  Maude Leriche, initial evaluation was November 27 2015  She had a history of iron deficiency anemia on iron supplement, hypertension, she complains of numbness tingling involving bilateral hands and feet since September 2017, she works at [employer], standing on her feet up to 8 hours each day, she denies gait abnormality, no bowel and bladder incontinence,  But since September 2017, each morning she woke up and noticed bilateral hands numbness tingling, at nighttime, she also noticed bilateral feet numbness tingling, intermittent, no significant worsening,  I reviewed and summarized her most recent ER visit, October 31 2015 for chest pain, November 04 2015 for paresthesia involving 4 extremities  Laboratory evaluation in September 2017, negative troponin, normal CBC with hemoglobin of 14.2, BMP showed elevated creatinine 1.37, with GFR 52, glucose was 122, HIV was negative in February 2017  I personally reviewed MRI lumbar in September 2017, mild degenerative disc disease most severe at L4-5, L5-S1, no significant canal or foraminal stenosis,   CAT scan of the brain showed no significant abnormality, MRI DWI was negative no evidence of acute stroke   UPDATE October 01 2016: She had MRI of cervical spine in April 2018, which showed multilevel degenerative changes, 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  She recovered well.  Now, she complains of intermittent numbness at her face, electrical shoot,   She had a history of migraine headache in the past. Now complains of frequent headaches     REVIEW OF SYSTEMS: Out of a complete 14 system review of symptoms, the patient complains only of the following symptoms, and all other reviewed systems are negative.  Fatigue, ear pain, ringing ears, eye pain, blurry vision, chest tightness, abdominal pain, joint pain, swelling, back pain, achy muscles, neck pain, stiffness, dizziness, headache, numbness  ALLERGIES: Allergies  Allergen Reactions  . Tramadol Nausea Only and Other (See Comments)    Pt states makes her groggy and nauseous    HOME MEDICATIONS: Outpatient Medications Prior to Visit  Medication Sig Dispense Refill  . cyclobenzaprine (FLEXERIL) 10 MG tablet Take 1 tablet by mouth daily as needed.  1  . ferrous sulfate 325 (65 FE) MG tablet Take 325 mg by mouth 3 (three) times daily.    . fluticasone (FLONASE) 50 MCG/ACT nasal spray Place 2 sprays into both nostrils daily.    Marland Kitchen gabapentin (NEURONTIN) 300 MG capsule Take 300 mg by mouth every evening.    . hyoscyamine (LEVSIN SL) 0.125 MG SL tablet Take 1-2 tablet by mouth every 4-6 hours as needed. (Patient taking differently: Take 0.125 mg by mouth every 4 (four) hours as needed for cramping. Take 1-2 tablet by mouth every 4-6 hours as needed.) 50 tablet 2  . lisinopril-hydrochlorothiazide (PRINZIDE,ZESTORETIC) 20-12.5 MG tablet Take 1 tablet by mouth daily after lunch.   0  . metFORMIN (GLUCOPHAGE) 500 MG tablet Take 500 mg by mouth every evening.  5  . Multiple Vitamin (MULTIVITAMIN WITH MINERALS) TABS tablet Take 1 tablet by mouth daily.    . ondansetron (ZOFRAN ODT) 4 MG disintegrating tablet Take 1 tablet (4 mg total) by mouth every 8 (eight) hours as needed for nausea or vomiting. 20 tablet 0  . Cholecalciferol  (VITAMIN D3) 5000 units CAPS Take 1 capsule by mouth daily.    Marland Kitchen 0.9 %  sodium chloride infusion      No facility-administered medications prior to visit.     PAST MEDICAL HISTORY: Past Medical History:  Diagnosis Date  . Arthritis    neck & back   . Bronchitis   . Diabetes mellitus without complication ([place])   . External hemorrhoid   . GERD (gastroesophageal reflux disease)   . Headache   . Hiatal hernia   . Hypertension   . Iron deficiency anemia    taking Fe TID, last hgb. 11.2, per pt.   . Numbness and tingling   . Schatzki's ring     PAST SURGICAL HISTORY: Past Surgical History:  Procedure Laterality Date  . BREAST LUMPECTOMY WITH RADIOACTIVE SEED LOCALIZATION Right 09/11/2015   Procedure: RIGHT BREAST LUMPECTOMY WITH RADIOACTIVE SEED LOCALIZATION;  Surgeon: Coralie Keens, MD;  Location: [facility];  Service: General;  Laterality: Right;  . CESAREAN SECTION     x 2  . CHOLECYSTECTOMY    . TUBAL LIGATION      FAMILY HISTORY: Family History  Problem Relation Age of Onset  . Hypertension Mother   . Diabetes Mother   . Diabetes Father   . Congestive Heart Failure Father   . Diabetes Brother   . Colon cancer Neg Hx     SOCIAL HISTORY: Social History   Social History  . Marital status: Single    Spouse name: N/A  . Number of children: 2  . Years of education: 3 years college   Occupational History  . Sales Associate    Social History Main Topics  . Smoking status: Never Smoker  . Smokeless tobacco: Never Used  . Alcohol use No  . Drug use: No  . Sexual activity: Not on file   Other Topics Concern  . Not on file   Social History Narrative   Lives at home with her son and her partner, [name].   Right-handed.   No caffeine use.      PHYSICAL EXAM  Vitals:   10/01/16 1436  BP: 139/81  Pulse: 60  Weight: 196 lb (88.9 kg)  Height: _0  (1.651 m)   Body mass index is 32.62 kg/m.  Generalized: Well developed, in no acute distress    Neurological examination  Mentation: Alert oriented to time, place, history taking. Follows all commands speech and language fluent Cranial nerve II-XII: Pupils were equal round reactive to light. Extraocular movements were full, visual field were full on confrontational test. Facial sensation and strength were normal. Uvula tongue midline. Head turning and shoulder shrug  were normal and symmetric. Motor: The motor testing reveals 5 over 5 strength of all 4 extremities. Good symmetric motor tone is noted throughout.  Sensory: Sensory testing is intact to soft touch on all 4 extremities. No evidence of extinction is noted.  Coordination: Cerebellar testing reveals good finger-nose-finger and heel-to-shin bilaterally.  Gait and station: Gait is normal. Tandem gait is normal. Romberg is negative. No drift is seen.  Reflexes: Deep tendon reflexes are symmetric and normal bilaterally.   DIAGNOSTIC DATA (LABS, IMAGING, TESTING) - I reviewed patient records,  labs, notes, testing and imaging myself where available.  Lab Results  Component Value Date   WBC WILL FOLLOW 03/26/2016   WBC 6.0 03/26/2016   HGB WILL FOLLOW 03/26/2016   HGB 12.8 03/26/2016   HCT WILL FOLLOW 03/26/2016   HCT 38.9 03/26/2016   MCV WILL FOLLOW 03/26/2016   MCV 87 03/26/2016   PLT WILL FOLLOW 03/26/2016   PLT 254 03/26/2016      Component Value Date/Time   NA 140 03/05/2016 1908   K 3.7 03/05/2016 1908   CL 106 03/05/2016 1908   CO2 29 03/05/2016 1908   GLUCOSE 116 (H) 03/05/2016 1908   BUN 8 03/05/2016 1908   CREATININE 1.12 (H) 03/05/2016 1908   CALCIUM 9.4 03/05/2016 1908   PROT 8.0 12/18/2015 1635   ALBUMIN 4.3 12/18/2015 1635   AST 24 12/18/2015 1635   ALT 15 12/18/2015 1635   ALKPHOS 69 12/18/2015 1635   BILITOT 0.9 12/18/2015 1635   GFRNONAA 56 (L) 03/05/2016 1908   GFRAA >60 03/05/2016 1908   No results found for: CHOL, HDL, LDLCALC, LDLDIRECT, TRIG, CHOLHDL No results found for: HGBA1C Lab  Results  Component Value Date   VITAMINB12 401 11/27/2015   Lab Results  Component Value Date   TSH 0.616 11/27/2015      ASSESSMENT AND PLAN 51 y.o. year old female   Facial paresthesia  Unsure etiology, MRI of the brain in September 2017 showed no significant abnormality  Chronic migraine headaches, worsening headaches   ESR C-reactive protein to rule out temporal arteritis  Continue NSAIDs, Flexeril, Zofran as needed  Chronic neck pain, status post cervical decompression surgery    Marcial Pacas, M.D. Ph.D.  [facility] [place], [place] [address] Phone: [phone] Fax:      [phone]

## 2016-10-02 LAB — C-REACTIVE PROTEIN: CRP: 3.7 mg/L (ref 0.0–4.9)

## 2016-10-02 LAB — SEDIMENTATION RATE: Sed Rate: 20 mm/hr (ref 0–40)

## 2016-10-08 ENCOUNTER — Ambulatory Visit: Payer: BLUE CROSS/BLUE SHIELD | Admitting: Neurology

## 2016-10-23 DIAGNOSIS — M2550 Pain in unspecified joint: Secondary | ICD-10-CM | POA: Diagnosis not present

## 2016-10-25 DIAGNOSIS — E119 Type 2 diabetes mellitus without complications: Secondary | ICD-10-CM | POA: Diagnosis not present

## 2016-10-25 DIAGNOSIS — I10 Essential (primary) hypertension: Secondary | ICD-10-CM | POA: Diagnosis not present

## 2016-11-03 DIAGNOSIS — M5412 Radiculopathy, cervical region: Secondary | ICD-10-CM | POA: Diagnosis not present

## 2016-11-03 DIAGNOSIS — M25512 Pain in left shoulder: Secondary | ICD-10-CM | POA: Diagnosis not present

## 2016-11-06 DIAGNOSIS — M2550 Pain in unspecified joint: Secondary | ICD-10-CM | POA: Diagnosis not present

## 2016-12-05 ENCOUNTER — Telehealth: Payer: Self-pay | Admitting: Neurology

## 2016-12-05 NOTE — Telephone Encounter (Signed)
Spoke to patient - symptoms have been worsening over the last two months.  Her appt has been moved to an earlier date.

## 2016-12-05 NOTE — Telephone Encounter (Signed)
Pt called with new onset of electric shocks in the head,temple,back and burning sensation in head, rt eye and rt facial side intermittent for the past couple of months.  Ongoing rt sided facial numbness also. An appt was scheduled with Dr Krista Blue 1/3 but the pt is wanting to be seen sooner. Please call

## 2016-12-09 DIAGNOSIS — R0789 Other chest pain: Secondary | ICD-10-CM | POA: Diagnosis not present

## 2016-12-09 DIAGNOSIS — M797 Fibromyalgia: Secondary | ICD-10-CM | POA: Diagnosis not present

## 2016-12-11 ENCOUNTER — Ambulatory Visit (INDEPENDENT_AMBULATORY_CARE_PROVIDER_SITE_OTHER): Payer: BLUE CROSS/BLUE SHIELD | Admitting: Neurology

## 2016-12-11 ENCOUNTER — Encounter: Payer: Self-pay | Admitting: Neurology

## 2016-12-11 VITALS — BP 154/84 | HR 64 | Ht 65.0 in | Wt 196.0 lb

## 2016-12-11 DIAGNOSIS — G43709 Chronic migraine without aura, not intractable, without status migrainosus: Secondary | ICD-10-CM

## 2016-12-11 DIAGNOSIS — IMO0002 Reserved for concepts with insufficient information to code with codable children: Secondary | ICD-10-CM

## 2016-12-11 MED ORDER — SUMATRIPTAN SUCCINATE 50 MG PO TABS: 50.0000 mg | ORAL_TABLET | ORAL | 6 refills | Status: DC | PRN

## 2016-12-11 NOTE — Progress Notes (Addendum)
PATIENT: Gabriela Lowe DOB: Dec 07, 1965  HISTORY OF PRESENT ILLNESS: HISTORY per Dr Rhea Belton notes: Gabriela Lowe is a 51 years old left-handed female, accompanied by her partner [name], seen in refer by emergency room for evaluation of paresthesia involving right side of her body, bilateral hands, feet, her primary care physician is [place]  Maude Leriche, initial evaluation was November 27 2015  She had a history of iron deficiency anemia on iron supplement, hypertension, she complains of numbness tingling involving bilateral hands and feet since September 2017, she works at [employer], standing on her feet up to 8 hours each day, she denies gait abnormality, no bowel and bladder incontinence,  But since September 2017, each morning she woke up, noticed bilateral hands numbness tingling, at nighttime, she also noticed bilateral feet numbness tingling, intermittent, no significant worsening,  I reviewed and summarized her most recent ER visit, October 31 2015 for chest pain, November 04 2015 for paresthesia involving 4 extremities  Laboratory evaluation in September 2017, negative troponin, normal CBC with hemoglobin of 14.2, BMP showed elevated creatinine 1.37, with GFR 52, glucose was 122, HIV was negative in February 2017  I personally reviewed MRI lumbar in September 2017, mild degenerative disc disease most severe at L4-5, L5-S1, no significant canal or foraminal stenosis,   CAT scan of the brain showed no significant abnormality, MRI DWI was negative no evidence of acute stroke   UPDATE October 01 2016: She had MRI of cervical spine in April 2018, which showed multilevel degenerative changes, moderate spinal stenosis at C5-6, moderate to severe right foraminal narrowing, C6-7 mild spinal stenosis, variable degree of foraminal narrowing,  She eventually had anterior approach cervical decompression surgery at [facility] in May 2018, which did help her neck pain.   She recovered well.  Now, she complains of intermittent numbness at her face, electrical shoot,   She had a history of migraine headache in the past. Now complains of frequent headaches   UPDATE Dec 11 2016: Her migraine has much improved, she has transient buring, shooting sensation in her head, cervical pain has much improved since cervical decompression surgery,  She has migraine headache about once or twice each week, responding somewhat to over-the-counter ibuprofen Tylenol  REVIEW OF SYSTEMS: Out of a complete 14 system review of symptoms, the patient complains only of the following symptoms, and all other reviewed systems are negative. Excessive sweaty, ear pain, ringing ears, eye pain, chest tightness, abdominal pain, joint pain, back pain, achy muscles, neck pain, neck stiffness, dizziness, headaches,  ALLERGIES: Allergies  Allergen Reactions  . Tramadol Nausea Only and Other (See Comments)    Pt states makes her groggy and nauseous    HOME MEDICATIONS: Outpatient Medications Prior to Visit  Medication Sig Dispense Refill  . ferrous sulfate 325 (65 FE) MG tablet Take 325 mg by mouth 3 (three) times daily.    . fluticasone (FLONASE) 50 MCG/ACT nasal spray Place 2 sprays into both nostrils daily.    . hyoscyamine (LEVSIN SL) 0.125 MG SL tablet Take 1-2 tablet by mouth every 4-6 hours as needed. (Patient taking differently: Take 0.125 mg by mouth every 4 (four) hours as needed for cramping. Take 1-2 tablet by mouth every 4-6 hours as needed.) 50 tablet 2  . lisinopril-hydrochlorothiazide (PRINZIDE,ZESTORETIC) 20-12.5 MG tablet Take 1 tablet by mouth daily after lunch.   0  . metFORMIN (GLUCOPHAGE) 500 MG tablet Take 500 mg by mouth every evening.  5  . Multiple  Vitamin (MULTIVITAMIN WITH MINERALS) TABS tablet Take 1 tablet by mouth daily.    . ondansetron (ZOFRAN ODT) 4 MG disintegrating tablet Take 1 tablet (4 mg total) by mouth every 8 (eight) hours as needed for nausea or  vomiting. 20 tablet 0  . cyclobenzaprine (FLEXERIL) 10 MG tablet Take 1 tablet by mouth daily as needed.  1  . gabapentin (NEURONTIN) 100 MG capsule Take 3 capsules (300 mg total) by mouth 3 (three) times daily. 270 capsule 11   No facility-administered medications prior to visit.     PAST MEDICAL HISTORY: Past Medical History:  Diagnosis Date  . Arthritis    neck & back   . Bronchitis   . Diabetes mellitus without complication ([place])   . External hemorrhoid   . GERD (gastroesophageal reflux disease)   . Headache   . Hiatal hernia   . Hypertension   . Iron deficiency anemia    taking Fe TID, last hgb. 11.2, per pt.   . Numbness and tingling   . Schatzki's ring     PAST SURGICAL HISTORY: Past Surgical History:  Procedure Laterality Date  . BREAST LUMPECTOMY WITH RADIOACTIVE SEED LOCALIZATION Right 09/11/2015   Procedure: RIGHT BREAST LUMPECTOMY WITH RADIOACTIVE SEED LOCALIZATION;  Surgeon: Coralie Keens, MD;  Location: [place];  Service: General;  Laterality: Right;  . CESAREAN SECTION     x 2  . CHOLECYSTECTOMY    . TUBAL LIGATION      FAMILY HISTORY: Family History  Problem Relation Age of Onset  . Hypertension Mother   . Diabetes Mother   . Diabetes Father   . Congestive Heart Failure Father   . Diabetes Brother   . Colon cancer Neg Hx     SOCIAL HISTORY: Social History   Social History  . Marital status: Single    Spouse name: N/A  . Number of children: 2  . Years of education: 3 years college   Occupational History  . Sales Associate    Social History Main Topics  . Smoking status: Never Smoker  . Smokeless tobacco: Never Used  . Alcohol use No  . Drug use: No  . Sexual activity: Not on file   Other Topics Concern  . Not on file   Social History Narrative   Lives at home with her son and her partner, [name].   Right-handed.   No caffeine use.      PHYSICAL EXAM  Vitals:   12/11/16 1139  BP: (!) 154/84  Pulse: 64  Weight: 196 lb  (88.9 kg)  Height: 5\' 5"  (1.651 m)   Body mass index is 32.62 kg/m.  Generalized: Well developed, in no acute distress   Neurological examination  Mentation: Alert oriented to time, place, history taking. Follows all commands speech and language fluent Cranial nerve II-XII: Pupils were equal round reactive to light. Extraocular movements were full, visual field were full on confrontational test. Facial sensation and strength were normal. Uvula tongue midline. Head turning and shoulder shrug  were normal and symmetric. Motor: The motor testing reveals 5 over 5 strength of all 4 extremities. Good symmetric motor tone is noted throughout.  Sensory: Sensory testing is intact to soft touch on all 4 extremities. No evidence of extinction is noted.  Coordination: Cerebellar testing reveals good finger-nose-finger and heel-to-shin bilaterally.  Gait and station: Gait is normal. Tandem gait is normal. Romberg is negative. No drift is seen.  Reflexes: Deep tendon reflexes are symmetric and normal bilaterally.   DIAGNOSTIC  DATA (LABS, IMAGING, TESTING) - I reviewed patient records, labs, notes, testing and imaging myself where available.  Lab Results  Component Value Date   WBC WILL FOLLOW 03/26/2016   WBC 6.0 03/26/2016   HGB WILL FOLLOW 03/26/2016   HGB 12.8 03/26/2016   HCT WILL FOLLOW 03/26/2016   HCT 38.9 03/26/2016   MCV WILL FOLLOW 03/26/2016   MCV 87 03/26/2016   PLT WILL FOLLOW 03/26/2016   PLT 254 03/26/2016      Component Value Date/Time   NA 140 03/05/2016 1908   K 3.7 03/05/2016 1908   CL 106 03/05/2016 1908   CO2 29 03/05/2016 1908   GLUCOSE 116 (H) 03/05/2016 1908   BUN 8 03/05/2016 1908   CREATININE 1.12 (H) 03/05/2016 1908   CALCIUM 9.4 03/05/2016 1908   PROT 8.0 12/18/2015 1635   ALBUMIN 4.3 12/18/2015 1635   AST 24 12/18/2015 1635   ALT 15 12/18/2015 1635   ALKPHOS 69 12/18/2015 1635   BILITOT 0.9 12/18/2015 1635   GFRNONAA 56 (L) 03/05/2016 1908   GFRAA >60  03/05/2016 1908   No results found for: CHOL, HDL, LDLCALC, LDLDIRECT, TRIG, CHOLHDL No results found for: HGBA1C Lab Results  Component Value Date   VITAMINB12 401 11/27/2015   Lab Results  Component Value Date   TSH 0.616 11/27/2015      ASSESSMENT AND PLAN 51 y.o. year old female   Facial paresthesia  Unsure etiology, MRI of the brain in September 2017 showed no significant abnormality  Chronic migraine headaches, worsening headaches   Imitrex 50mg  prn  Chronic neck pain, status post cervical decompression surgery    Marcial Pacas, M.D. Ph.D.  [facility] [place], [place] [address] Phone: [phone] Fax:      [phone]

## 2016-12-16 ENCOUNTER — Ambulatory Visit (INDEPENDENT_AMBULATORY_CARE_PROVIDER_SITE_OTHER): Payer: BLUE CROSS/BLUE SHIELD | Admitting: Physician Assistant

## 2016-12-16 ENCOUNTER — Encounter: Payer: Self-pay | Admitting: Physician Assistant

## 2016-12-16 VITALS — BP 120/82 | HR 65 | Ht 65.0 in | Wt 194.0 lb

## 2016-12-16 DIAGNOSIS — R0789 Other chest pain: Secondary | ICD-10-CM | POA: Diagnosis not present

## 2016-12-16 DIAGNOSIS — H938X3 Other specified disorders of ear, bilateral: Secondary | ICD-10-CM | POA: Diagnosis not present

## 2016-12-16 DIAGNOSIS — E119 Type 2 diabetes mellitus without complications: Secondary | ICD-10-CM

## 2016-12-16 DIAGNOSIS — M4302 Spondylolysis, cervical region: Secondary | ICD-10-CM | POA: Diagnosis not present

## 2016-12-16 DIAGNOSIS — I10 Essential (primary) hypertension: Secondary | ICD-10-CM

## 2016-12-16 DIAGNOSIS — M26623 Arthralgia of bilateral temporomandibular joint: Secondary | ICD-10-CM | POA: Diagnosis not present

## 2016-12-16 DIAGNOSIS — H9202 Otalgia, left ear: Secondary | ICD-10-CM | POA: Diagnosis not present

## 2016-12-16 NOTE — Progress Notes (Signed)
Cardiology Office Note    Date:  12/18/2016   ID:  Gabriela Lowe, DOB 11/05/1965, MRN [id number]  PCP:  Maude Leriche, PA-C  Cardiologist:  New (previously seen by Angelena Form in Feb 2018)   Chief Complaint  Patient presents with  . Follow-up    HEART PALPITATIONS FOR 2 WEEKS, NOT CURRENTLY HAVING THEM TODAY    History of Present Illness:  Gabriela Lowe is a 51 y.o. female with PMH of HTN, Schatzki's ring, arthritis, DM II and GERD. She was last seen by Angelena Form PA-C on 04/10/2016 as a new patient for evaluation of chest pain. She also has been followed by [facility] service for persistent nausea, belching and epigastric pain. She had a chest CT in December 2017 that showed mild pericardial thickening questioning some possible pericarditis, however no coronary calcification was seen. She was seen in the ED in January 2018 for chest pain. Troponin negative, EKG showed no evidence of ischemia or pericarditis. During initial evaluation by cardiology service, her symptom was consistent with pericarditis. ESR was elevated. A 2-D echocardiogram showed EF 60-65%, moderate LAE, mild TR, no evidence of pericarditis on echo. During her last office visit back in February, she was given colchicine 0.5 mg twice a day and ibuprofen 600 mg 3 times a day for treatment of pericarditis. She was instructed to follow-up on an as-needed basis. She has also been followed by Dr. Krista Blue for evaluation of paresthesia involving the right side of her body, bilateral hand and feet. She eventually underwent cervical decompression surgery.  [name] She would sit up and symptom will quickly go away. It lasted only a few seconds at a time. She does not notice the same symptom during exertion. She works at [employer] and frequently have to lift  heavy object. She does notice a left-sided chest pain with lifting boxes, however that appears to be more musculoskeletal in nature especially after recent neck surgery. Her chest discomfort occurs 1-2 times a week. Given how atypical her symptom is, I recommended continued observation for now. If her symptoms become more frequent, we may consider a GXT and also a cardiac calcium score. Risk factors include hypertension, DM 2 and early family history of CAD.   Past Medical History:  Diagnosis Date  . Arthritis    neck & back   . Bronchitis   . Diabetes mellitus without complication ([place])   . External hemorrhoid   . GERD (gastroesophageal reflux disease)   . Headache   . Hiatal hernia   . Hypertension   . Iron deficiency anemia    taking Fe TID, last hgb. 11.2, per pt.   . Numbness and tingling   . Schatzki's ring     Past Surgical History:  Procedure Laterality Date  . BREAST LUMPECTOMY WITH RADIOACTIVE SEED LOCALIZATION Right 09/11/2015   Procedure: RIGHT BREAST LUMPECTOMY WITH RADIOACTIVE SEED LOCALIZATION;  Surgeon: Coralie Keens, MD;  Location: [place];  Service: General;  Laterality: Right;  . CESAREAN SECTION     x 2  . CHOLECYSTECTOMY    . TUBAL LIGATION      Current Medications: Outpatient Medications Prior to Visit  Medication Sig Dispense Refill  . DULoxetine (CYMBALTA) 60 MG capsule Take by mouth daily.  2  . ferrous sulfate 325 (65 FE) MG tablet Take 325 mg by  mouth 3 (three) times daily.    . fluticasone (FLONASE) 50 MCG/ACT nasal spray Place 2 sprays into both nostrils daily.    Marland Kitchen lisinopril-hydrochlorothiazide (PRINZIDE,ZESTORETIC) 20-12.5 MG tablet Take 1 tablet by mouth daily after lunch.   0  . metFORMIN (GLUCOPHAGE) 500 MG tablet Take 500 mg by mouth every evening.  5  . Multiple Vitamin (MULTIVITAMIN WITH MINERALS) TABS tablet Take 1 tablet by mouth daily.    . ondansetron (ZOFRAN ODT) 4 MG disintegrating tablet Take 1 tablet (4 mg total) by mouth every 8  (eight) hours as needed for nausea or vomiting. 20 tablet 0  . SUMAtriptan (IMITREX) 50 MG tablet Take 1 tablet (50 mg total) by mouth every 2 (two) hours as needed for migraine. May repeat in 2 hours if headache persists or recurs. 12 tablet 6  . tiZANidine (ZANAFLEX) 2 MG tablet Take 2 mg by mouth 3 (three) times daily as needed.  2  . hyoscyamine (LEVSIN SL) 0.125 MG SL tablet Take 1-2 tablet by mouth every 4-6 hours as needed. (Patient not taking: Reported on 12/16/2016) 50 tablet 2   No facility-administered medications prior to visit.      Allergies:   Tramadol   Social History   Social History  . Marital status: Single    Spouse name: N/A  . Number of children: 2  . Years of education: 3 years college   Occupational History  . Sales Associate    Social History Main Topics  . Smoking status: Never Smoker  . Smokeless tobacco: Never Used  . Alcohol use No  . Drug use: No  . Sexual activity: Not Asked   Other Topics Concern  . None   Social History Narrative   Lives at home with her son and her partner, [name].   Right-handed.   No caffeine use.     Family History:  The patient's family history includes Congestive Heart Failure in her father; Diabetes in her brother, father, and mother; Heart attack (age of onset: 40) in her father; Hypertension in her mother.   ROS:   Please see the history of present illness.    ROS All other systems reviewed and are negative.   PHYSICAL EXAM:   VS:  BP 120/82   Pulse 65   Ht 5' 5"  (1.651 m)   Wt 194 lb (88 kg)   BMI 32.28 kg/m    GEN: Well nourished, well developed, in no acute distress  HEENT: normal  Neck: no JVD, carotid bruits, or masses Cardiac: RRR; no murmurs, rubs, or gallops,no edema  Respiratory:  clear to auscultation bilaterally, normal work of breathing GI: soft, nontender, nondistended, + BS MS: no deformity or atrophy  Skin: warm and dry, no rash Neuro:  Alert and Oriented x 3, Strength and sensation  are intact Psych: euthymic mood, full affect  Wt Readings from Last 3 Encounters:  12/16/16 194 lb (88 kg)  12/11/16 196 lb (88.9 kg)  10/01/16 196 lb (88.9 kg)      Studies/Labs Reviewed:   EKG:  EKG is ordered today.  The ekg ordered today demonstrates Normal sinus rhythm, no significant ST-T wave changes  Recent Labs: 03/05/2016: BUN 8; Creatinine, Ser 1.12; Potassium 3.7; Sodium 140 03/26/2016: Hemoglobin WILL FOLLOW; Hemoglobin 12.8; Platelets WILL FOLLOW; Platelets 254   Lipid Panel No results found for: CHOL, TRIG, HDL, CHOLHDL, VLDL, LDLCALC, LDLDIRECT  Additional studies/ records that were reviewed today include:   Echo 04/09/2016 LV EF: 60% -  65%  Study Conclusions  - Left ventricle: The cavity size was normal. There was mild focal   basal hypertrophy of the septum. Systolic function was normal.   The estimated ejection fraction was in the range of 60% to 65%.   Wall motion was normal; there were no regional wall motion   abnormalities. Left ventricular diastolic function parameters   were normal. - Aortic valve: Transvalvular velocity was within the normal range.   There was no stenosis. There was no regurgitation. - Mitral valve: Transvalvular velocity was within the normal range.   There was no evidence for stenosis. There was trivial   regurgitation. - Left atrium: The atrium was moderately dilated. - Right ventricle: The cavity size was normal. Wall thickness was   normal. Systolic function was normal. - Tricuspid valve: There was mild regurgitation. - Pulmonary arteries: Systolic pressure was within the normal   range. PA peak pressure: 36 mm Hg (S).   ASSESSMENT:    1. Atypical chest pain   2. Essential hypertension   3. Controlled type 2 diabetes mellitus without complication, without long-term current use of insulin (HCC)      PLAN:  In order of problems listed above:  1. Atypical chest pain: She described it as a pulling sensation over the  left side of the chest, does not occur with exertion. She has a separate musculoskeletal pain when she lifted up heavy boxes. Given her atypical nature of the symptom, I recommended observation. She will need to start on 81 mg aspirin. If symptom worsens, I would recommend a GXT and calcium score.  2. Hypertension: Blood pressure well controlled on current medication.  3. DM 2: We'll defer management primary care provider    Medication Adjustments/Labs and Tests Ordered: Current medicines are reviewed at length with the patient today.  Concerns regarding medicines are outlined above.  Medication changes, Labs and Tests ordered today are listed in the Patient Instructions below. Patient Instructions  Medication Instructions:  START- Aspirin 81 mg daily  If you need a refill on your cardiac medications before your next appointment, please call your pharmacy.  Labwork: None Ordered   Testing/Procedures: None Ordered  Follow-Up: Your physician wants you to follow-up in: 3 Months with Dr Sallyanne Kuster.    Thank you for choosing [facility] at [facility]!!     2    Signed, Almyra Deforest, [place]  12/18/2016 7:10 AM    [address], [place], [place]  [address] Phone: [phone]; Fax: [phone]

## 2016-12-16 NOTE — Patient Instructions (Signed)
Medication Instructions:  START- Aspirin 81 mg daily  If you need a refill on your cardiac medications before your next appointment, please call your pharmacy.  Labwork: None Ordered   Testing/Procedures: None Ordered  Follow-Up: Your physician wants you to follow-up in: 3 Months with Dr Sallyanne Kuster.    Thank you for choosing [facility] at [facility]!!     2

## 2016-12-18 ENCOUNTER — Encounter: Payer: Self-pay | Admitting: Physician Assistant

## 2016-12-27 ENCOUNTER — Encounter: Payer: Self-pay | Admitting: Neurology

## 2017-01-14 DIAGNOSIS — H16143 Punctate keratitis, bilateral: Secondary | ICD-10-CM | POA: Diagnosis not present

## 2017-01-15 ENCOUNTER — Encounter: Payer: Self-pay | Admitting: Internal Medicine

## 2017-01-31 DIAGNOSIS — J019 Acute sinusitis, unspecified: Secondary | ICD-10-CM | POA: Diagnosis not present

## 2017-02-20 ENCOUNTER — Ambulatory Visit: Payer: BLUE CROSS/BLUE SHIELD | Admitting: Neurology

## 2017-02-21 DIAGNOSIS — R070 Pain in throat: Secondary | ICD-10-CM | POA: Diagnosis not present

## 2017-02-21 DIAGNOSIS — M26609 Unspecified temporomandibular joint disorder, unspecified side: Secondary | ICD-10-CM | POA: Diagnosis not present

## 2017-02-21 DIAGNOSIS — J3489 Other specified disorders of nose and nasal sinuses: Secondary | ICD-10-CM | POA: Diagnosis not present

## 2017-02-25 DIAGNOSIS — M25512 Pain in left shoulder: Secondary | ICD-10-CM | POA: Diagnosis not present

## 2017-02-25 DIAGNOSIS — M2550 Pain in unspecified joint: Secondary | ICD-10-CM | POA: Diagnosis not present

## 2017-02-26 ENCOUNTER — Other Ambulatory Visit: Payer: Self-pay | Admitting: Otolaryngology

## 2017-02-26 DIAGNOSIS — R1310 Dysphagia, unspecified: Secondary | ICD-10-CM

## 2017-02-26 DIAGNOSIS — R070 Pain in throat: Secondary | ICD-10-CM

## 2017-02-26 DIAGNOSIS — M542 Cervicalgia: Secondary | ICD-10-CM

## 2017-03-06 ENCOUNTER — Other Ambulatory Visit: Payer: BLUE CROSS/BLUE SHIELD

## 2017-03-17 ENCOUNTER — Ambulatory Visit
Admission: RE | Admit: 2017-03-17 | Discharge: 2017-03-17 | Disposition: A | Discharge status: Home / Self Care | Payer: BLUE CROSS/BLUE SHIELD | Source: Ambulatory Visit | Attending: Otolaryngology | Admitting: Otolaryngology

## 2017-03-17 DIAGNOSIS — M542 Cervicalgia: Secondary | ICD-10-CM

## 2017-03-17 DIAGNOSIS — R1310 Dysphagia, unspecified: Secondary | ICD-10-CM

## 2017-03-17 DIAGNOSIS — R070 Pain in throat: Secondary | ICD-10-CM

## 2017-03-27 ENCOUNTER — Ambulatory Visit: Payer: BLUE CROSS/BLUE SHIELD | Admitting: Cardiovascular Disease

## 2017-03-27 ENCOUNTER — Other Ambulatory Visit: Payer: Self-pay | Admitting: Physician Assistant

## 2017-03-27 ENCOUNTER — Encounter: Payer: Self-pay | Admitting: Cardiovascular Disease

## 2017-03-27 ENCOUNTER — Other Ambulatory Visit: Payer: Self-pay

## 2017-03-27 VITALS — BP 143/84 | HR 56 | Resp 16 | Ht 65.0 in | Wt 191.0 lb

## 2017-03-27 DIAGNOSIS — Z1231 Encounter for screening mammogram for malignant neoplasm of breast: Secondary | ICD-10-CM

## 2017-03-27 DIAGNOSIS — Z8679 Personal history of other diseases of the circulatory system: Secondary | ICD-10-CM

## 2017-03-27 DIAGNOSIS — I10 Essential (primary) hypertension: Secondary | ICD-10-CM

## 2017-03-27 NOTE — Progress Notes (Signed)
Cardiology Office Note:    Date:  03/28/2017   ID:  Gabriela Lowe, DOB [birth year], MRN [id number]  PCP:  Maude Leriche, PA-C  Cardiologist:  No primary care provider on file.   Referring MD: Maude Leriche, PA-C   Chief Complaint  Patient presents with  . Follow-up  pericarditis  History of Present Illness:    Gabriela Lowe is a 52 y.o. female with a hx of protracted acute pericarditis last year.  She also has a history of type 2 diabetes mellitus and hypertension as well as gastroesophageal reflux disease with a Schatzki's ring and degenerative arthritis.  Her pleuritic chest pain resolved several months ago but she still has some discomfort when she bends over.  This sounds more consistent with a musculoskeletal mechanism.  She works at [employer] where she has to [employer] and this commonly brings the symptoms on.  The current pain is clearly not pleuritic and is actually worsened by bending forward rather than leaning back.  Labs performed last summer showed a much improved ESR down to 20 and CRP down to 3.7.  ANA was negative.  Her most recent hemoglobin A1c was excellent at 6.2%.  Her last lipid profile showed an LDL cholesterol 92 and HDL of 82, triglycerides 51.  Past Medical History:  Diagnosis Date  . Arthritis    neck & back   . Bronchitis   . Diabetes mellitus without complication ([place])   . External hemorrhoid   . GERD (gastroesophageal reflux disease)   . Headache   . Hiatal hernia   . Hypertension   . Iron deficiency anemia    taking Fe TID, last hgb. 11.2, per pt.   . Numbness and tingling   . Schatzki's ring     Past Surgical History:  Procedure Laterality Date  . BREAST LUMPECTOMY WITH RADIOACTIVE SEED LOCALIZATION Right 09/11/2015   Procedure: RIGHT BREAST LUMPECTOMY WITH RADIOACTIVE SEED LOCALIZATION;  Surgeon: Coralie Keens, MD;  Location: [place];  Service: General;  Laterality: Right;  . CESAREAN SECTION     x 2  . CHOLECYSTECTOMY    .  TUBAL LIGATION      Current Medications: Current Meds  Medication Sig  . aspirin EC 81 MG tablet Take 81 mg by mouth daily.  . ferrous sulfate 325 (65 FE) MG tablet Take 325 mg by mouth 3 (three) times daily.  . fluticasone (FLONASE) 50 MCG/ACT nasal spray Place 2 sprays into both nostrils daily.  Marland Kitchen lisinopril-hydrochlorothiazide (PRINZIDE,ZESTORETIC) 20-12.5 MG tablet Take 1 tablet by mouth daily after lunch.   . metFORMIN (GLUCOPHAGE) 500 MG tablet Take 500 mg by mouth every evening.  . Multiple Vitamin (MULTIVITAMIN WITH MINERALS) TABS tablet Take 1 tablet by mouth daily.  . ondansetron (ZOFRAN ODT) 4 MG disintegrating tablet Take 1 tablet (4 mg total) by mouth every 8 (eight) hours as needed for nausea or vomiting.  . [DISCONTINUED] DULoxetine (CYMBALTA) 60 MG capsule Take by mouth daily.  . [DISCONTINUED] SUMAtriptan (IMITREX) 50 MG tablet Take 1 tablet (50 mg total) by mouth every 2 (two) hours as needed for migraine. May repeat in 2 hours if headache persists or recurs.  . [DISCONTINUED] tiZANidine (ZANAFLEX) 2 MG tablet Take 2 mg by mouth 3 (three) times daily as needed.     Allergies:   Tramadol   Social History   Socioeconomic History  . Marital status: Single    Spouse name: None  . Number of children: 2  . Years of education: 3  years college  . Highest education level: None  Social Needs  . Financial resource strain: None  . Food insecurity - worry: None  . Food insecurity - inability: None  . Transportation needs - medical: None  . Transportation needs - non-medical: None  Occupational History  . Occupation: Chemical engineer  Tobacco Use  . Smoking status: Never Smoker  . Smokeless tobacco: Never Used  Substance and Sexual Activity  . Alcohol use: No  . Drug use: No  . Sexual activity: None  Other Topics Concern  . None  Social History Narrative   Lives at home with her son and her partner, [name].   Right-handed.   No caffeine use.     Family  History: The patient's family history includes Congestive Heart Failure in her father; Diabetes in her brother, father, and mother; Heart attack (age of onset: 23) in her father; Hypertension in her mother. There is no history of Colon cancer.  ROS:   Please see the history of present illness.     All other systems reviewed and are negative.  EKGs/Labs/Other Studies Reviewed:     EKG:  EKG is ordered today.  The ekg ordered today demonstrates sinus bradycardia otherwise completely normal  Recent Labs: Labs performed last summer showed a much improved ESR down to 20 and CRP down to 3.7.  ANA was negative.  Her most recent hemoglobin A1c was excellent at 6.2%.  Her last lipid profile showed an LDL cholesterol 92 and HDL of 82, triglycerides 51.  In September 2018 creatinine 1.12  Physical Exam:    VS:  BP (!) 143/84   Pulse (!) 56   Resp 16   Ht 5' 5"  (1.651 m)   Wt 191 lb (86.6 kg)   SpO2 100%   BMI 31.78 kg/m     Wt Readings from Last 3 Encounters:  03/27/17 191 lb (86.6 kg)  12/16/16 194 lb (88 kg)  12/11/16 196 lb (88.9 kg)     GEN:  Well nourished, well developed in no acute distress HEENT: Normal NECK: No JVD; No carotid bruits LYMPHATICS: No lymphadenopathy CARDIAC: RRR, no murmurs, rubs, gallops RESPIRATORY:  Clear to auscultation without rales, wheezing or rhonchi  ABDOMEN: Soft, non-tender, non-distended MUSCULOSKELETAL:  No edema; No deformity  SKIN: Warm and dry NEUROLOGIC:  Alert and oriented x 3 PSYCHIATRIC:  Normal affect   ASSESSMENT:    1. Essential hypertension   2. History of pericarditis    PLAN:    In order of problems listed above:  1. Acute pericarditis: Resolved, routine cardiology follow-up is not necessary.  There is no evidence of underlying collagen vascular disease 2. HTN: Usually her blood pressure at home is better, no changes appear necessary to her current medical regimen.  Follow-up with PCP 3. DM: Good glycemic control.  She is  mildly obese and weight loss is recommended.   Medication Adjustments/Labs and Tests Ordered: Current medicines are reviewed at length with the patient today.  Concerns regarding medicines are outlined above.  Orders Placed This Encounter  Procedures  . EKG 12-Lead   No orders of the defined types were placed in this encounter.   Signed, Sanda Klein, MD  03/28/2017 8:01 PM    [place]

## 2017-03-27 NOTE — Patient Instructions (Signed)
Dr Croitoru recommends that you follow-up with him as needed. 

## 2017-03-28 DIAGNOSIS — Z8679 Personal history of other diseases of the circulatory system: Secondary | ICD-10-CM | POA: Insufficient documentation

## 2017-04-03 NOTE — Progress Notes (Signed)
[facility] Clinic Note - Initial Visit   Date: 04/04/17  Gabriela Lowe MRN: [id number] DOB: 05-Feb-1966   Dear Maude Leriche, PA:  Thank you for your kind referral of Gabriela Lowe for consultation of atypical facial pain. Although her history is well known to you, please allow [place] to reiterate it for the purpose of our medical record. The patient was accompanied to the clinic by mother who also provides collateral information.     History of Present Illness: Gabriela Lowe is a 52 y.o. left-handed African American female with cervical spinal stenosis s/p ACDF at C5-6, diabetes mellitus, fibromyalgia, hypertension, and iron deficiency anemia presenting for evaluation of facial paresthesias.    Since 2017, she started having intermittent spells of burning sensation over the base of her neck which involves the side of the scalp, which also involves the nose and eyes.  It occurs about 2-3 times per day, lasting a few seconds.  She denies any triggers and alleviating factors.  She also complains of bitemporal pressure and tension headaches, which typically last a few minutes.  It is improved by soft tissue massage.  She denies any vision changes, weakness or falls.    She has history of migraine which occur 1-2 times per month and respond OTC ibuprofen and are well-controlled.    Out-side paper records, electronic medical record, and images have been reviewed where available and summarized as:  MRI cervical spine 05/27/2016: 1.   At C3-C4, there is mild spinal stenosis and mild thickening of the posterior longitudinal ligament. The neural foramina are mildly narrowed and there is no nerve root compression. 2.   At C4-C5, there is moderate spinal stenosis due to thickening and calcification of the posterior longitudinal ligament and uncovertebral spurring. There is moderately severe right foraminal narrowing at could lead to right C5 nerve root compression 3.    At C5-C6 and C6-C7 there is mild spinal stenosis due to disc bulging and possibly congenitally short pedicles. There does not appear to be nerve root compression at these levels. 4.   The spinal cord has normal signal. There is a normal enhancement pattern.  MRIs lumbar spine 11/14/2015: 1. Mild lumbar spondylosis and facet arthropathy with trace facet effusions of the lower lumbar spine. Mild bilateral foraminal narrowing of L4 through S1. No significant canal stenosis. No evidence for neural impingement. 2. No acute osseous abnormality or abnormal enhancement.  MRI brain without contrast 10/31/2015: No diffusion signal abnormality to suggest acute or early subacute infarct.  Lab Results  Component Value Date   TSH 0.616 11/27/2015   Lab Results  Component Value Date   OVZCHYIF02 774 11/27/2015    Past Medical History:  Diagnosis Date  . Arthritis    neck & back   . Bronchitis   . Diabetes mellitus without complication ([place])   . External hemorrhoid   . GERD (gastroesophageal reflux disease)   . Headache   . Hiatal hernia   . Hypertension   . Iron deficiency anemia    taking Fe TID, last hgb. 11.2, per pt.   . Numbness and tingling   . Schatzki's ring     Past Surgical History:  Procedure Laterality Date  . BREAST LUMPECTOMY WITH RADIOACTIVE SEED LOCALIZATION Right 09/11/2015   Procedure: RIGHT BREAST LUMPECTOMY WITH RADIOACTIVE SEED LOCALIZATION;  Surgeon: Coralie Keens, MD;  Location: [facility];  Service: General;  Laterality: Right;  . CESAREAN SECTION     x 2  . CHOLECYSTECTOMY    .  TUBAL LIGATION       Medications:  Outpatient Encounter Medications as of 04/04/2017  Medication Sig  . aspirin EC 81 MG tablet Take 81 mg by mouth daily.  . ferrous sulfate 325 (65 FE) MG tablet Take 325 mg by mouth 3 (three) times daily.  . fluticasone (FLONASE) 50 MCG/ACT nasal spray Place 2 sprays into both nostrils daily.  Marland Kitchen lisinopril-hydrochlorothiazide (PRINZIDE,ZESTORETIC)  20-12.5 MG tablet Take 1 tablet by mouth daily after lunch.   . metFORMIN (GLUCOPHAGE) 500 MG tablet Take 500 mg by mouth every evening.  . Multiple Vitamin (MULTIVITAMIN WITH MINERALS) TABS tablet Take 1 tablet by mouth daily.  . ondansetron (ZOFRAN ODT) 4 MG disintegrating tablet Take 1 tablet (4 mg total) by mouth every 8 (eight) hours as needed for nausea or vomiting.  Marland Kitchen tiZANidine (ZANAFLEX) 2 MG tablet Take by mouth every 6 (six) hours as needed for muscle spasms.   No facility-administered encounter medications on file as of 04/04/2017.      Allergies:  Allergies  Allergen Reactions  . Tramadol Nausea Only and Other (See Comments)    Pt states makes her groggy and nauseous    Family History: Family History  Problem Relation Age of Onset  . Hypertension Mother   . Diabetes Mother   . Diabetes Father   . Congestive Heart Failure Father   . Heart attack Father 35  . Diabetes Brother   . Colon cancer Neg Hx     Social History: Social History   Tobacco Use  . Smoking status: Never Smoker  . Smokeless tobacco: Never Used  Substance Use Topics  . Alcohol use: No  . Drug use: No   Social History   Social History Narrative   Lives at home with her son.  Works at [employer].  Education: 3 years of college.    Right-handed.   No caffeine.     Review of Systems:  CONSTITUTIONAL: No fevers, chills, night sweats, or weight loss.   EYES: No visual changes or eye pain ENT: No hearing changes.  No history of nose bleeds.   RESPIRATORY: No cough, wheezing and shortness of breath.   CARDIOVASCULAR: Negative for chest pain, and palpitations.   GI: Negative for abdominal discomfort, blood in stools or black stools.  No recent change in bowel habits.   GU:  No history of incontinence.   MUSCLOSKELETAL: No history of joint pain or swelling.  +myalgias.   SKIN: Negative for lesions, rash, and itching.   HEMATOLOGY/ONCOLOGY: Negative for prolonged bleeding, bruising easily, and  swollen nodes.  No history of cancer.   ENDOCRINE: Negative for cold or heat intolerance, polydipsia or goiter.   PSYCH:  No depression or anxiety symptoms.   NEURO: As Above.   Vital Signs:  BP 140/84   Pulse 84   Ht 5\' 5"  (1.651 m)   Wt 191 lb 6 oz (86.8 kg)   SpO2 99%   BMI 31.85 kg/m    General Medical Exam:   General:  Well appearing, comfortable.   Eyes/ENT: see cranial nerve examination.  Neck position is sidebent to the left, rotated right at baseline.  There is no tenderness over the greater or lesser occipital nerves Neck: No masses appreciated.  Full range of motion without tenderness.  No carotid bruits. Respiratory:  Clear to auscultation, good air entry bilaterally.   Cardiac:  Regular rate and rhythm, no murmur.   Extremities:  No deformities, edema, or skin discoloration.  Skin:  No rashes  or lesions.  Neurological Exam: MENTAL STATUS including orientation to time, place, person, recent and remote memory, attention span and concentration, language, and fund of knowledge is normal.  Speech is not dysarthric.  CRANIAL NERVES: II:  No visual field defects.  Unremarkable fundi.   III-IV-VI: Pupils equal round and reactive to light.  Normal conjugate, extra-ocular eye movements in all directions of gaze.  No nystagmus.  No ptosis.   V:  Normal facial sensation.    VII:  Normal facial symmetry and movements.  No pathologic facial reflexes.  VIII:  Normal hearing and vestibular function.   IX-X:  Normal palatal movement.   XI:  Normal shoulder shrug and head rotation.   XII:  Normal tongue strength and range of motion, no deviation or fasciculation.  MOTOR: Motor strength is 5/5 throughout. No atrophy, fasciculations or abnormal movements.  No pronator drift.  Tone is normal.    MSRs:  Reflexes are 2+/4 throughout. Plantars are down going.  SENSORY:  Normal and symmetric perception of light touch, pinprick, vibration, and proprioception.  Romberg's sign absent.    COORDINATION/GAIT: Normal finger-to- nose-finger.  Intact rapid alternating movements bilaterally.  Able to rise from a chair without using arms.  Gait narrow based and stable. Tandem and stressed gait intact.    IMPRESSION: 1.  Sporadic and very transient paresthesias of the scalp involving the face, unclear etiology. Symptoms are too brief for a migraine variant.   She has history of fibromyalgia which can manifest with various sensory complaints.   MRI brain from 2015 was viewed personally.  Reassured patient that with normal exam and MRI brain, it is very unlikely that she has anything worrisome.    2.  Cervicalgia with history of cervical spinal stenosis s/p  ACDF C4-5.  She takes tizanidine 2mg  daily.  Recommend starting neck physiotherapy.  Return to clinic as needed  Thank you for allowing me to participate in patient's care.  If I can answer any additional questions, I would be pleased to do so.    Sincerely,    Donika K. Posey Pronto, DO

## 2017-04-04 ENCOUNTER — Encounter: Payer: Self-pay | Admitting: Neurology

## 2017-04-04 ENCOUNTER — Ambulatory Visit: Payer: BLUE CROSS/BLUE SHIELD | Admitting: Neurology

## 2017-04-04 VITALS — BP 140/84 | HR 84 | Ht 65.0 in | Wt 191.4 lb

## 2017-04-04 DIAGNOSIS — M542 Cervicalgia: Secondary | ICD-10-CM

## 2017-04-04 DIAGNOSIS — R209 Unspecified disturbances of skin sensation: Secondary | ICD-10-CM

## 2017-04-04 DIAGNOSIS — R202 Paresthesia of skin: Secondary | ICD-10-CM

## 2017-04-04 NOTE — Patient Instructions (Signed)
Start neck physiotherapy  Return to clinic as needed 

## 2017-04-17 ENCOUNTER — Ambulatory Visit: Payer: BLUE CROSS/BLUE SHIELD

## 2017-04-29 ENCOUNTER — Other Ambulatory Visit (HOSPITAL_COMMUNITY)
Admission: RE | Admit: 2017-04-29 | Discharge: 2017-04-29 | Disposition: A | Discharge status: Home / Self Care | Payer: BLUE CROSS/BLUE SHIELD | Source: Ambulatory Visit | Attending: Nurse Practitioner | Admitting: Nurse Practitioner

## 2017-04-29 ENCOUNTER — Other Ambulatory Visit: Payer: Self-pay | Admitting: Nurse Practitioner

## 2017-04-29 DIAGNOSIS — R102 Pelvic and perineal pain: Secondary | ICD-10-CM | POA: Diagnosis not present

## 2017-04-29 DIAGNOSIS — Z01419 Encounter for gynecological examination (general) (routine) without abnormal findings: Secondary | ICD-10-CM | POA: Insufficient documentation

## 2017-04-29 DIAGNOSIS — N898 Other specified noninflammatory disorders of vagina: Secondary | ICD-10-CM | POA: Diagnosis not present

## 2017-05-01 LAB — CYTOLOGY - PAP
Diagnosis: NEGATIVE
HPV: NOT DETECTED

## 2017-05-06 ENCOUNTER — Ambulatory Visit
Admission: RE | Admit: 2017-05-06 | Discharge: 2017-05-06 | Disposition: A | Discharge status: Home / Self Care | Payer: BLUE CROSS/BLUE SHIELD | Source: Ambulatory Visit | Attending: Physician Assistant | Admitting: Physician Assistant

## 2017-05-06 DIAGNOSIS — Z1231 Encounter for screening mammogram for malignant neoplasm of breast: Secondary | ICD-10-CM

## 2017-05-07 DIAGNOSIS — E119 Type 2 diabetes mellitus without complications: Secondary | ICD-10-CM | POA: Diagnosis not present

## 2017-05-07 DIAGNOSIS — R42 Dizziness and giddiness: Secondary | ICD-10-CM | POA: Diagnosis not present

## 2017-05-07 DIAGNOSIS — D509 Iron deficiency anemia, unspecified: Secondary | ICD-10-CM | POA: Diagnosis not present

## 2017-05-07 DIAGNOSIS — I10 Essential (primary) hypertension: Secondary | ICD-10-CM | POA: Diagnosis not present

## 2017-05-15 DIAGNOSIS — I10 Essential (primary) hypertension: Secondary | ICD-10-CM | POA: Diagnosis not present

## 2017-05-15 DIAGNOSIS — E119 Type 2 diabetes mellitus without complications: Secondary | ICD-10-CM | POA: Diagnosis not present

## 2017-05-15 DIAGNOSIS — D509 Iron deficiency anemia, unspecified: Secondary | ICD-10-CM | POA: Diagnosis not present

## 2017-05-16 ENCOUNTER — Encounter: Payer: Self-pay | Admitting: Neurology

## 2017-05-20 NOTE — Progress Notes (Signed)
Follow-up Visit   Date: 05/21/17    Gabriela Lowe MRN: [id number] DOB: 1965-03-08   Interim History: JOSETTE Lowe is a 52 y.o. left-handed African American female with cervical spinal stenosis s/p ACDF at C5-6, diabetes mellitus, fibromyalgia, hypertension, and iron deficiency anemia returning to the clinic for follow-up of headaches.  The patient was accompanied to the clinic by self.  History of present illness: Since 2017, she started having intermittent spells of burning sensation over the base of her neck which involves the side of the scalp, which also involves the nose and eyes.  It occurs about 2-3 times per day, lasting a few seconds.  She denies any triggers and alleviating factors.  She also complains of bitemporal pressure and tension headaches, which typically last a few minutes.  It is improved by soft tissue massage.  She denies any vision changes, weakness or falls.    She has history of migraine which occur 1-2 times per month and respond OTC ibuprofen and are well-controlled.    UPDATE 05/20/2017: She scheduled sooner follow-up visit because of increased pressure of the temples and tenderness around her eyes.  She is having swelling around her eyes.  She is having daily throbbing headaches, lasting all day.  She takes tylenol which eases her pain. She has some nausea, no vomiting.  She continues to have burning over the scalp.  She does not get any relief with tizanidine.  She is requesting further imaging.   Medications:  Current Outpatient Medications on File Prior to Visit  Medication Sig Dispense Refill  . aspirin EC 81 MG tablet Take 81 mg by mouth daily.    . ferrous sulfate 325 (65 FE) MG tablet Take 325 mg by mouth 3 (three) times daily.    . fluticasone (FLONASE) 50 MCG/ACT nasal spray Place 2 sprays into both nostrils daily.    Marland Kitchen lisinopril-hydrochlorothiazide (PRINZIDE,ZESTORETIC) 20-12.5 MG tablet Take 1 tablet by mouth daily after lunch.   0  .  metFORMIN (GLUCOPHAGE) 500 MG tablet Take 500 mg by mouth every evening.  5  . Multiple Vitamin (MULTIVITAMIN WITH MINERALS) TABS tablet Take 1 tablet by mouth daily.    . ondansetron (ZOFRAN ODT) 4 MG disintegrating tablet Take 1 tablet (4 mg total) by mouth every 8 (eight) hours as needed for nausea or vomiting. 20 tablet 0  . tiZANidine (ZANAFLEX) 2 MG tablet Take by mouth every 6 (six) hours as needed for muscle spasms.     No current facility-administered medications on file prior to visit.     Allergies:  Allergies  Allergen Reactions  . Tramadol Nausea Only and Other (See Comments)    Pt states makes her groggy and nauseous    Review of Systems:  CONSTITUTIONAL: No fevers, chills, night sweats, or weight loss.  EYES: No visual changes or eye pain ENT: No hearing changes.  No history of nose bleeds.   RESPIRATORY: No cough, wheezing and shortness of breath.   CARDIOVASCULAR: Negative for chest pain, and palpitations.   GI: Negative for abdominal discomfort, blood in stools or black stools.  No recent change in bowel habits.   GU:  No history of incontinence.   MUSCLOSKELETAL: No history of joint pain or swelling.  No myalgias.   SKIN: Negative for lesions, rash, and itching.   ENDOCRINE: Negative for cold or heat intolerance, polydipsia or goiter.   PSYCH:  No depression +anxiety symptoms.   NEURO: As Above.   Vital Signs:  BP Marland Kitchen)  160/90   Pulse 72   Ht _0  (1.651 m)   Wt 189 lb (85.7 kg)   SpO2 98%   BMI 31.45 kg/m    General: Well appearing Neck: no carotid bruit CV: regular Ext: normal  Neurological Exam: MENTAL STATUS including orientation to time, place, person, recent and remote memory, attention span and concentration, language, and fund of knowledge is normal.  Speech is not dysarthric.  CRANIAL NERVES: No visual field defects.  Pupils equal round and reactive to light.  Normal conjugate, extra-ocular eye movements in all directions of gaze.  No ptosis.  Normal facial sensation.  Face is symmetric. Palate elevates symmetrically.  Tongue is midline.  MOTOR:  Motor strength is 5/5 in all extremities.  No atrophy, fasciculations or abnormal movements.  No pronator drift.  Tone is normal.    MSRs:  Reflexes are 2+/4 throughout.  SENSORY:  Intact to vibration throughout.  COORDINATION/GAIT:   Intact rapid alternating movements bilaterally.  Gait narrow based and stable.   Data: MRI cervical spine 05/27/2016: 1. At C3-C4, there is mild spinal stenosis and mild thickening of the posterior longitudinal ligament. The neural foramina are mildly narrowed and there is no nerve root compression. 2. At C4-C5, there is moderate spinal stenosis due to thickening and calcification of the posterior longitudinal ligament and uncovertebral spurring. There is moderately severe right foraminal narrowing at could lead to right C5 nerve root compression 3. At C5-C6 and C6-C7 there is mild spinal stenosis due to disc bulging and possibly congenitally short pedicles. There does not appear to be nerve root compression at these levels. 4. The spinal cord has normal signal. There is a normal enhancement pattern.  MRIs lumbar spine 11/14/2015: 1. Mild lumbar spondylosis and facet arthropathy with trace facet effusions of the lower lumbar spine. Mild bilateral foraminal narrowing of L4 through S1. No significant canal stenosis. No evidence for neural impingement. 2. No acute osseous abnormality or abnormal enhancement.  MRI brain without contrast 10/31/2015: No diffusion signal abnormality to suggest acute or early subacute infarct.  Lab Results  Component Value Date   ESRSEDRATE 20 10/01/2016   Lab Results  Component Value Date   CRP 3.7 10/01/2016     IMPRESSION/PLAN: 1.  Chronic daily headaches with temporal tenderness on the left.  Check inflammatory markers to be sure temporal arteritis is not missed.  Reassured patient that my suspicion for any  intracranial pathology is very low, especially as MRI brain from 2017 was normal.  She is very concerned and requesting repeat MRI brain.    2.  Sporadic scalp and facial paresthesias, nonspecific.  Will check contrasted MRI brain, but again, unlikely to be anything worrisome.  3.  Cervicalgia with history of cervical spinal stenosis s/p  ACDF C4-5.  She takes tizanidine 54m daily, but does not have any improvement.  She has not started neck physiotherapy.  4.  Eye swelling, likely due to seasonal allergies.  Follow-up with PCP.   PLAN/RECOMMENDATIONS:  Check ESR, CRP MRI brain wwo contrast Start nortriptyline 119mat bedtime for 2 week, then increase to 2 tablet at bedtime, hopefully this will help with both her paresthesia and headache.   Return to clinic in 4 months   Thank you for allowing me to participate in patient's care.  If I can answer any additional questions, I would be pleased to do so.    Sincerely,    Diem Dicocco K. PaPosey ProntoDO

## 2017-05-21 ENCOUNTER — Encounter: Payer: Self-pay | Admitting: Neurology

## 2017-05-21 ENCOUNTER — Other Ambulatory Visit (INDEPENDENT_AMBULATORY_CARE_PROVIDER_SITE_OTHER): Payer: BLUE CROSS/BLUE SHIELD

## 2017-05-21 ENCOUNTER — Ambulatory Visit: Payer: BLUE CROSS/BLUE SHIELD | Admitting: Neurology

## 2017-05-21 VITALS — BP 160/90 | HR 72 | Ht 65.0 in | Wt 189.0 lb

## 2017-05-21 DIAGNOSIS — R202 Paresthesia of skin: Secondary | ICD-10-CM

## 2017-05-21 DIAGNOSIS — G43709 Chronic migraine without aura, not intractable, without status migrainosus: Secondary | ICD-10-CM

## 2017-05-21 DIAGNOSIS — R208 Other disturbances of skin sensation: Secondary | ICD-10-CM | POA: Diagnosis not present

## 2017-05-21 DIAGNOSIS — R209 Unspecified disturbances of skin sensation: Secondary | ICD-10-CM

## 2017-05-21 DIAGNOSIS — IMO0002 Reserved for concepts with insufficient information to code with codable children: Secondary | ICD-10-CM

## 2017-05-21 LAB — C-REACTIVE PROTEIN: CRP: 0.4 mg/dL — ABNORMAL LOW (ref 0.5–20.0)

## 2017-05-21 LAB — SEDIMENTATION RATE: Sed Rate: 16 mm/hr (ref 0–30)

## 2017-05-21 MED ORDER — NORTRIPTYLINE HCL 10 MG PO CAPS: ORAL_CAPSULE | ORAL | 5 refills | Status: DC

## 2017-05-21 NOTE — Patient Instructions (Signed)
Start nortriptyline 10mg  at bedtime for 2 week, then increase to 2 tablet at bedtime  MRI brain wwo contrast  Check labs  Return to clinic in 4 months

## 2017-06-02 ENCOUNTER — Ambulatory Visit
Admission: RE | Admit: 2017-06-02 | Discharge: 2017-06-02 | Disposition: A | Discharge status: Home / Self Care | Payer: BLUE CROSS/BLUE SHIELD | Source: Ambulatory Visit | Attending: Neurology | Admitting: Neurology

## 2017-06-02 ENCOUNTER — Other Ambulatory Visit: Payer: Self-pay | Admitting: Neurology

## 2017-06-02 DIAGNOSIS — R51 Headache: Secondary | ICD-10-CM | POA: Diagnosis not present

## 2017-06-02 DIAGNOSIS — G43709 Chronic migraine without aura, not intractable, without status migrainosus: Secondary | ICD-10-CM

## 2017-06-02 DIAGNOSIS — R202 Paresthesia of skin: Secondary | ICD-10-CM

## 2017-06-02 DIAGNOSIS — R208 Other disturbances of skin sensation: Secondary | ICD-10-CM

## 2017-06-02 DIAGNOSIS — R209 Unspecified disturbances of skin sensation: Secondary | ICD-10-CM

## 2017-06-02 DIAGNOSIS — E236 Other disorders of pituitary gland: Secondary | ICD-10-CM | POA: Diagnosis not present

## 2017-06-02 DIAGNOSIS — IMO0002 Reserved for concepts with insufficient information to code with codable children: Secondary | ICD-10-CM

## 2017-06-03 ENCOUNTER — Encounter: Payer: Self-pay | Admitting: Neurology

## 2017-06-10 DIAGNOSIS — M26623 Arthralgia of bilateral temporomandibular joint: Secondary | ICD-10-CM | POA: Diagnosis not present

## 2017-06-10 DIAGNOSIS — J310 Chronic rhinitis: Secondary | ICD-10-CM | POA: Diagnosis not present

## 2017-07-09 DIAGNOSIS — M549 Dorsalgia, unspecified: Secondary | ICD-10-CM | POA: Diagnosis not present

## 2017-07-21 DIAGNOSIS — M940 Chondrocostal junction syndrome [Tietze]: Secondary | ICD-10-CM | POA: Diagnosis not present

## 2017-07-21 DIAGNOSIS — S29012A Strain of muscle and tendon of back wall of thorax, initial encounter: Secondary | ICD-10-CM | POA: Diagnosis not present

## 2017-08-22 DIAGNOSIS — B349 Viral infection, unspecified: Secondary | ICD-10-CM | POA: Diagnosis not present

## 2017-08-22 DIAGNOSIS — J0100 Acute maxillary sinusitis, unspecified: Secondary | ICD-10-CM | POA: Diagnosis not present

## 2017-08-28 DIAGNOSIS — N644 Mastodynia: Secondary | ICD-10-CM | POA: Diagnosis not present

## 2017-09-17 DIAGNOSIS — M542 Cervicalgia: Secondary | ICD-10-CM | POA: Diagnosis not present

## 2017-09-17 DIAGNOSIS — M545 Low back pain: Secondary | ICD-10-CM | POA: Diagnosis not present

## 2017-09-26 DIAGNOSIS — M5412 Radiculopathy, cervical region: Secondary | ICD-10-CM | POA: Diagnosis not present

## 2017-09-26 DIAGNOSIS — M542 Cervicalgia: Secondary | ICD-10-CM | POA: Diagnosis not present

## 2017-10-03 DIAGNOSIS — M5412 Radiculopathy, cervical region: Secondary | ICD-10-CM | POA: Diagnosis not present

## 2017-10-03 DIAGNOSIS — M542 Cervicalgia: Secondary | ICD-10-CM | POA: Diagnosis not present

## 2017-10-09 DIAGNOSIS — M542 Cervicalgia: Secondary | ICD-10-CM | POA: Diagnosis not present

## 2017-10-09 DIAGNOSIS — M5412 Radiculopathy, cervical region: Secondary | ICD-10-CM | POA: Diagnosis not present

## 2017-10-16 DIAGNOSIS — M5412 Radiculopathy, cervical region: Secondary | ICD-10-CM | POA: Diagnosis not present

## 2017-10-16 DIAGNOSIS — M542 Cervicalgia: Secondary | ICD-10-CM | POA: Diagnosis not present

## 2017-10-21 ENCOUNTER — Other Ambulatory Visit: Payer: Self-pay | Admitting: Neurology

## 2017-11-24 DIAGNOSIS — R1013 Epigastric pain: Secondary | ICD-10-CM | POA: Diagnosis not present

## 2017-11-24 DIAGNOSIS — M2550 Pain in unspecified joint: Secondary | ICD-10-CM | POA: Diagnosis not present

## 2017-11-24 DIAGNOSIS — M25512 Pain in left shoulder: Secondary | ICD-10-CM | POA: Diagnosis not present

## 2017-11-24 DIAGNOSIS — M25511 Pain in right shoulder: Secondary | ICD-10-CM | POA: Diagnosis not present

## 2017-12-02 DIAGNOSIS — H9202 Otalgia, left ear: Secondary | ICD-10-CM | POA: Diagnosis not present

## 2017-12-02 DIAGNOSIS — M2669 Other specified disorders of temporomandibular joint: Secondary | ICD-10-CM | POA: Diagnosis not present

## 2017-12-02 DIAGNOSIS — J309 Allergic rhinitis, unspecified: Secondary | ICD-10-CM | POA: Diagnosis not present

## 2017-12-22 DIAGNOSIS — M542 Cervicalgia: Secondary | ICD-10-CM | POA: Diagnosis not present

## 2018-01-16 IMAGING — DX DG CHEST 2V
2 series · 2 of 2 positions shown · non-contrast
Comparison: Chest radiograph November 20, 2012

CLINICAL DATA: Chest pain and LEFT arm tingling. History of
hypertension bronchitis.

EXAM:
CHEST  2 VIEW

[w chest pa]
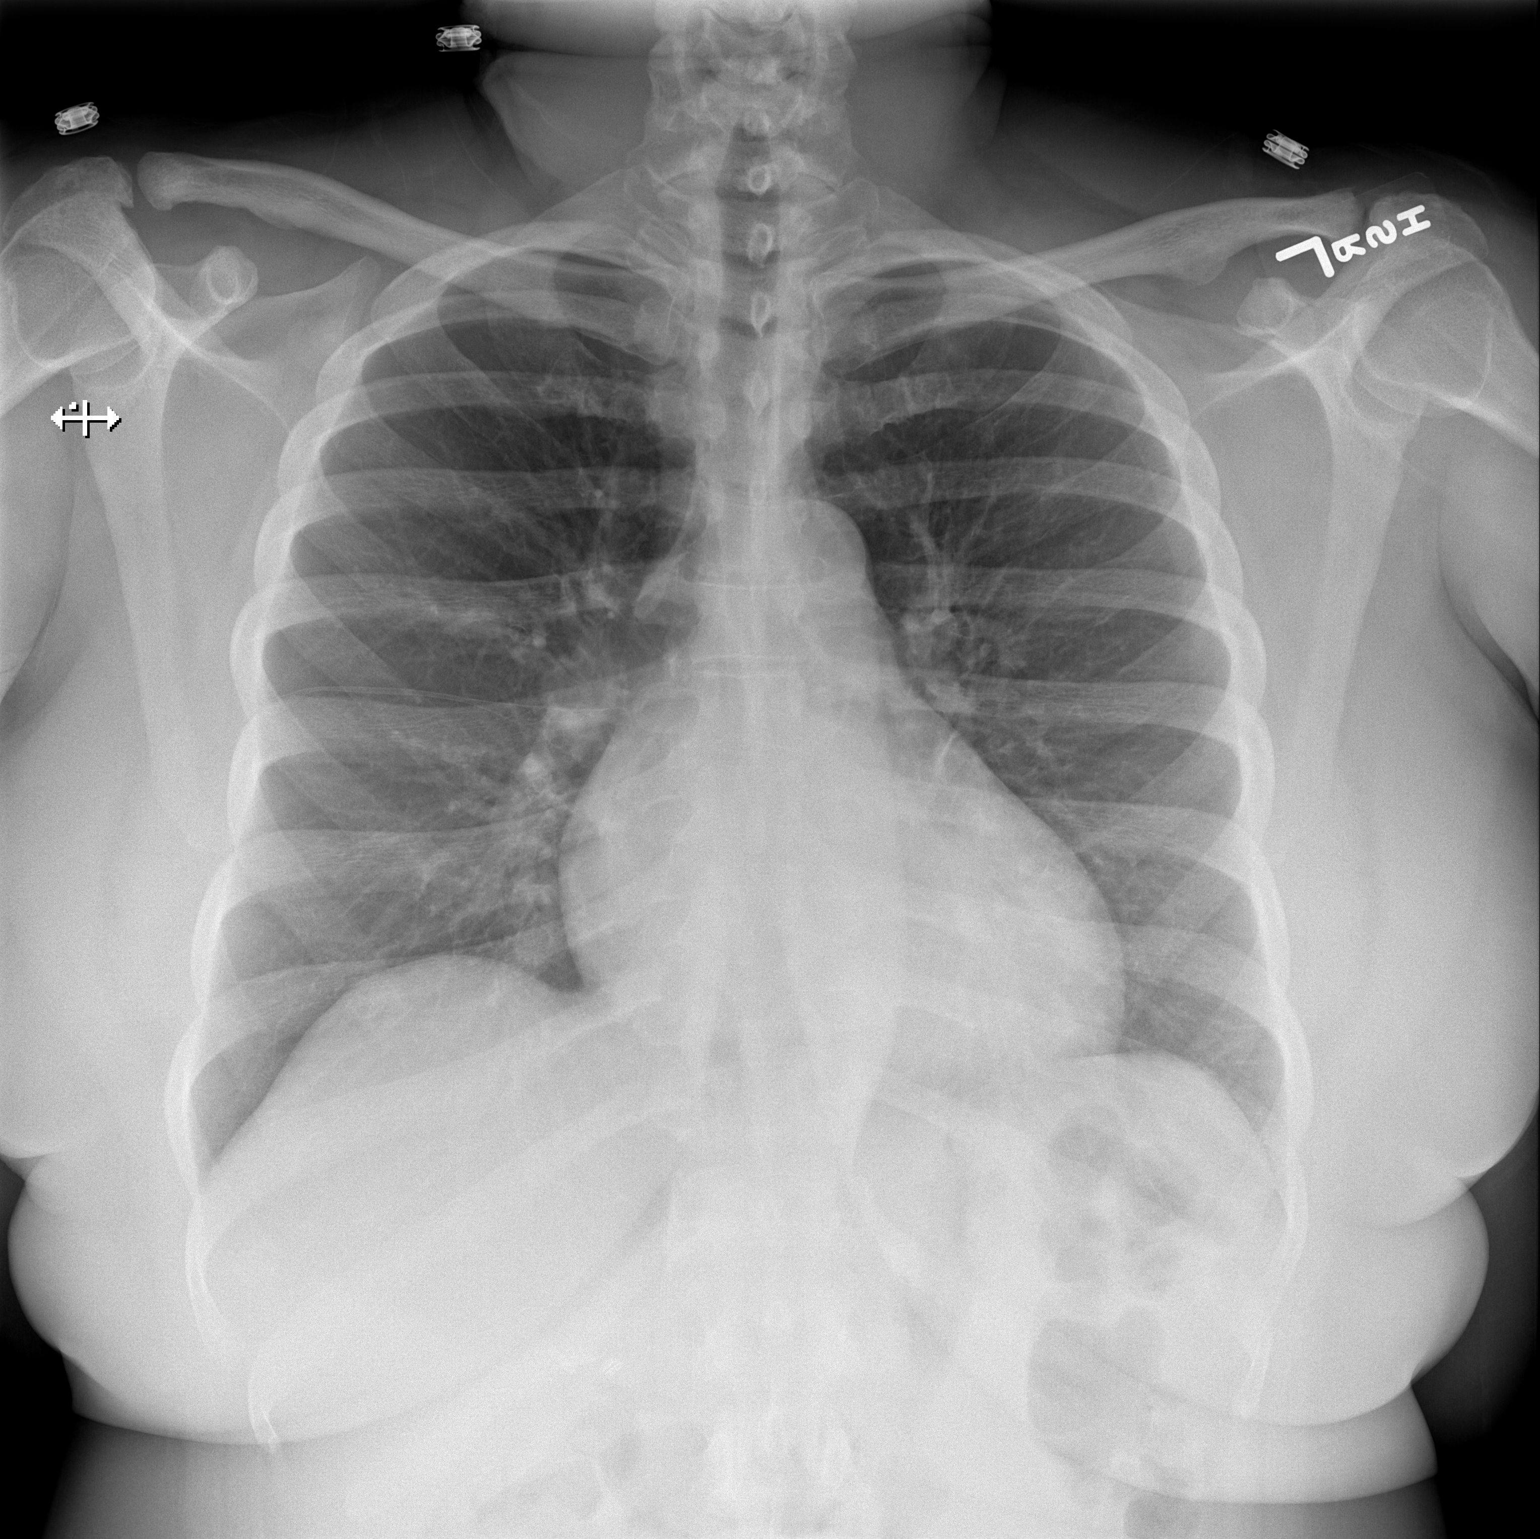

[w chest lat]
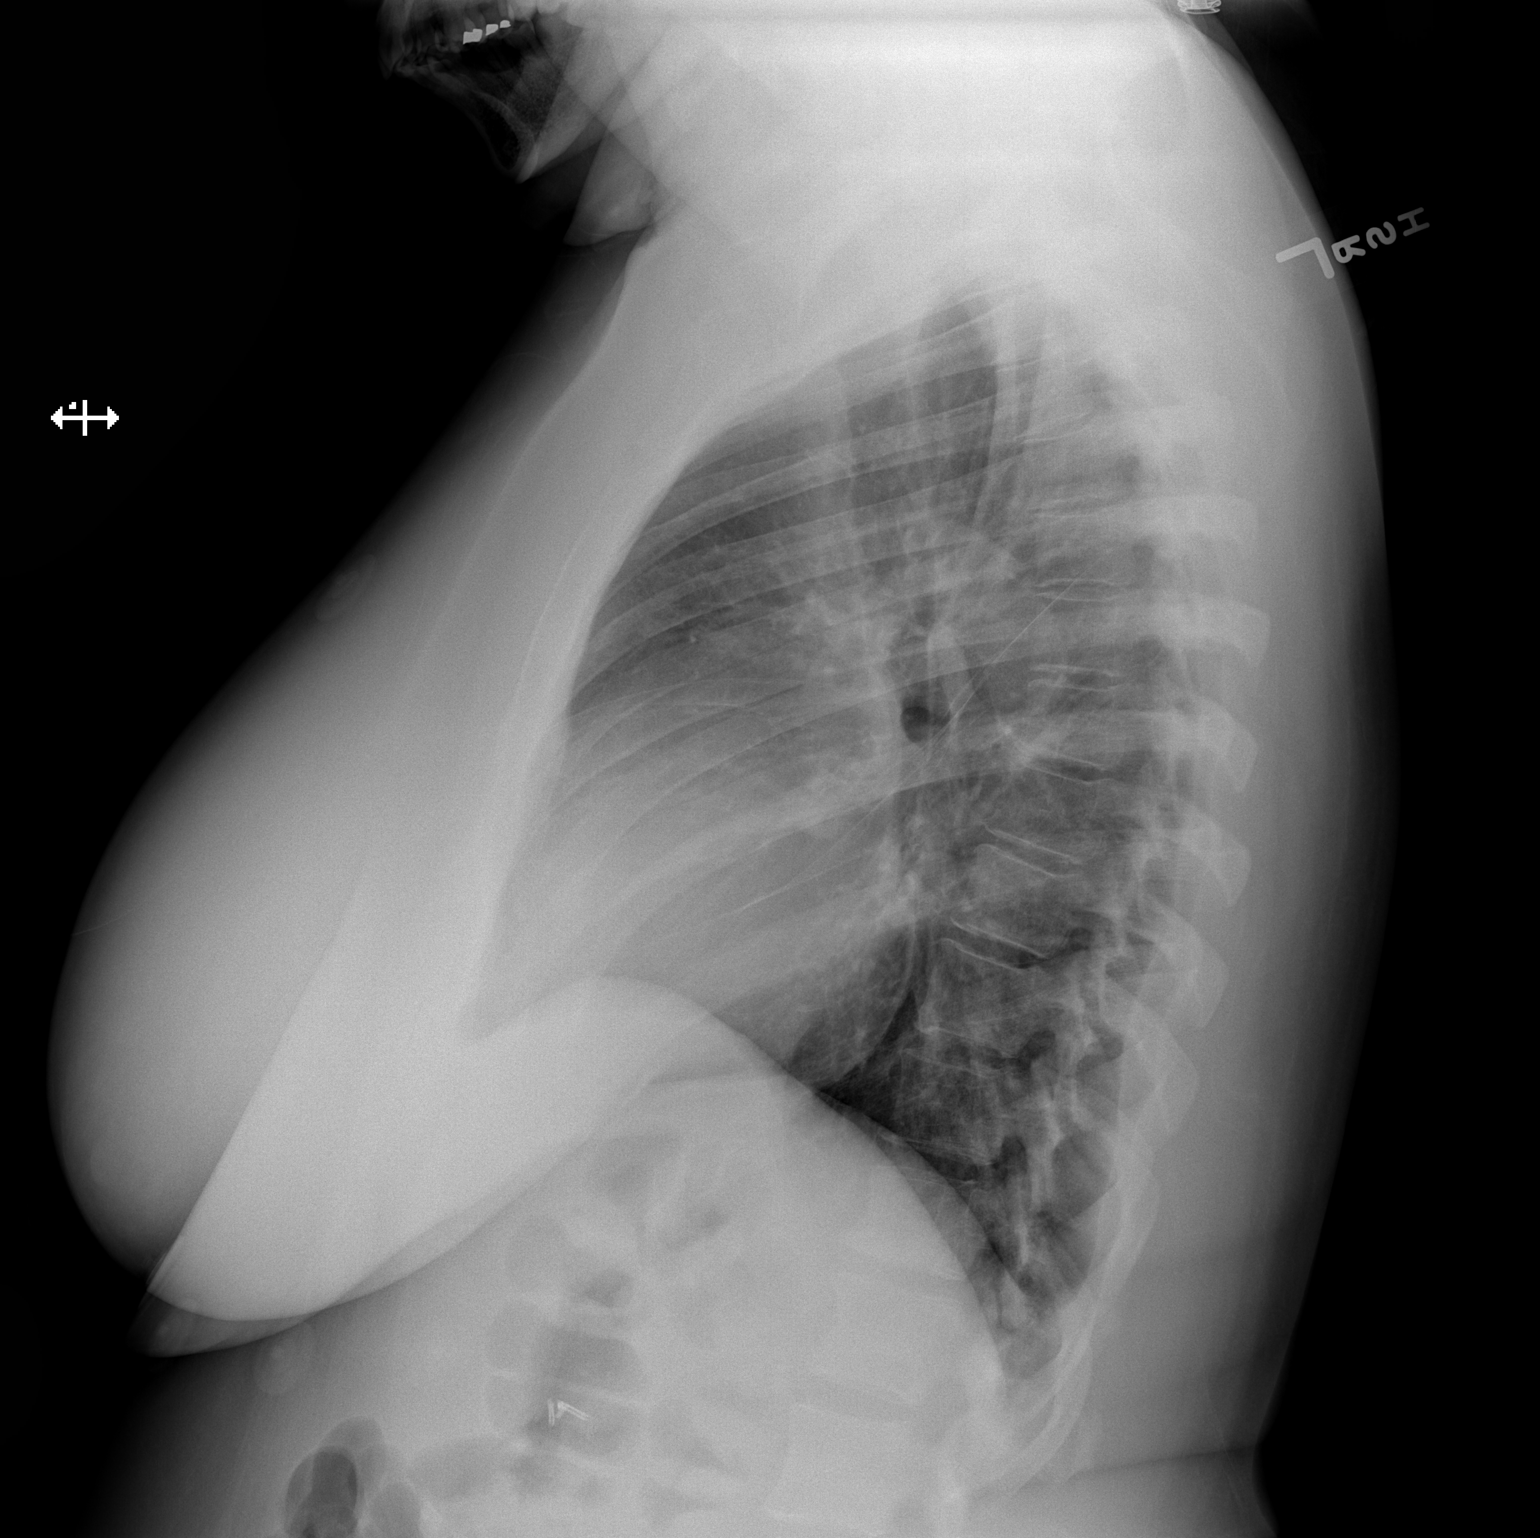

[2 of 2 positions shown; findings below may reference images not displayed]

FINDINGS: Cardiomediastinal silhouette is normal. The lungs are clear without
pleural effusions or focal consolidations. Trachea projects midline
and there is no pneumothorax. Soft tissue planes and included
osseous structures are non-suspicious. Surgical clips in the
included right abdomen compatible with cholecystectomy.
IMPRESSION: Normal chest.

## 2018-01-21 DIAGNOSIS — M25511 Pain in right shoulder: Secondary | ICD-10-CM | POA: Diagnosis not present

## 2018-01-22 ENCOUNTER — Other Ambulatory Visit: Payer: Self-pay | Admitting: Orthopaedic Surgery

## 2018-01-26 ENCOUNTER — Other Ambulatory Visit: Payer: Self-pay | Admitting: Orthopaedic Surgery

## 2018-01-26 DIAGNOSIS — E119 Type 2 diabetes mellitus without complications: Secondary | ICD-10-CM | POA: Diagnosis not present

## 2018-01-26 DIAGNOSIS — M25511 Pain in right shoulder: Secondary | ICD-10-CM

## 2018-02-07 ENCOUNTER — Ambulatory Visit
Admission: RE | Admit: 2018-02-07 | Discharge: 2018-02-07 | Disposition: A | Discharge status: Home / Self Care | Payer: BLUE CROSS/BLUE SHIELD | Source: Ambulatory Visit | Attending: Orthopaedic Surgery | Admitting: Orthopaedic Surgery

## 2018-02-07 DIAGNOSIS — M25511 Pain in right shoulder: Secondary | ICD-10-CM

## 2018-02-25 DIAGNOSIS — M25511 Pain in right shoulder: Secondary | ICD-10-CM | POA: Diagnosis not present

## 2018-03-02 ENCOUNTER — Other Ambulatory Visit: Payer: Self-pay | Admitting: Neurology

## 2018-03-03 DIAGNOSIS — E119 Type 2 diabetes mellitus without complications: Secondary | ICD-10-CM | POA: Diagnosis not present

## 2018-03-03 DIAGNOSIS — I10 Essential (primary) hypertension: Secondary | ICD-10-CM | POA: Diagnosis not present

## 2018-03-03 DIAGNOSIS — K219 Gastro-esophageal reflux disease without esophagitis: Secondary | ICD-10-CM | POA: Diagnosis not present

## 2018-03-03 DIAGNOSIS — D509 Iron deficiency anemia, unspecified: Secondary | ICD-10-CM | POA: Diagnosis not present

## 2018-03-23 ENCOUNTER — Other Ambulatory Visit: Payer: Self-pay | Admitting: Physician Assistant

## 2018-03-23 DIAGNOSIS — Z1231 Encounter for screening mammogram for malignant neoplasm of breast: Secondary | ICD-10-CM

## 2018-03-27 ENCOUNTER — Ambulatory Visit: Payer: BLUE CROSS/BLUE SHIELD | Admitting: Internal Medicine

## 2018-03-27 ENCOUNTER — Encounter: Payer: Self-pay | Admitting: Internal Medicine

## 2018-03-27 VITALS — BP 114/64 | HR 68 | Ht 64.0 in | Wt 205.4 lb

## 2018-03-27 DIAGNOSIS — K589 Irritable bowel syndrome without diarrhea: Secondary | ICD-10-CM | POA: Diagnosis not present

## 2018-03-27 DIAGNOSIS — K219 Gastro-esophageal reflux disease without esophagitis: Secondary | ICD-10-CM | POA: Diagnosis not present

## 2018-03-27 MED ORDER — RIFAXIMIN 550 MG PO TABS: 550.0000 mg | ORAL_TABLET | Freq: Three times a day (TID) | ORAL | 0 refills | Status: DC

## 2018-03-27 NOTE — Patient Instructions (Signed)
Continue pantoprazole 40 mg every morning  Please purchase the following medications over the counter and take as directed: Famotidine 20 mg every evening if needed  We have sent the following medications to your pharmacy for you to pick up at your convenience: Xifaxan 550 mg three times daily x 14 days  If you are age 53 or older, your body mass index should be between 23-30. Your Body mass index is 35.26 kg/m. If this is out of the aforementioned range listed, please consider follow up with your Primary Care Provider.  If you are age 39 or younger, your body mass index should be between 19-25. Your Body mass index is 35.26 kg/m. If this is out of the aformentioned range listed, please consider follow up with your Primary Care Provider.

## 2018-03-27 NOTE — Progress Notes (Signed)
Subjective:    Patient ID: Gabriela Lowe, female    DOB: 08-10-1965, 53 y.o.   MRN: [id number]  HPI [name] is a 53 year old female with a history of GERD, dyspepsia, upper abdominal bloating with pain, iron deficiency anemia, prior enteritis by CT scan not documented by upper endoscopy, colonoscopy or video capsule endoscopy who is here for follow-up to discuss upper abdominal pain, bloating and nausea.  No vomiting.  She was last seen in the office 2 years ago.  She is here alone today.  Since being here last she has been diagnosed and treated for fibromyalgia by Dr. Amil Amen with rheumatology.  She is been treated with gabapentin and muscle relaxers which have helped this condition.  Most recently she is developed recurrent upper abdominal complaints including upper abdominal pain, bloating and gas pressure.  Also nausea without vomiting.  Some heartburn but no dysphagia or odynophagia.  Recently started back on pantoprazole 40 mg in the morning and using some ranitidine in the evening.  This has helped her heartburn to some degree.  Bowel movements have been regular for her which are 2 or 3 days/week.  She uses stool softeners because she is remained on oral iron which causes some mild hard stool.  Stools are dark on iron.  No blood in her stool or melena.  The past her work-up has included upper endoscopy, video capsule endoscopy, colonoscopy, CT scan and gastric emptying study.   Review of Systems As per HPI, otherwise negative  Current Medications, Allergies, Past Medical History, Past Surgical History, Family History and Social History were reviewed in [employer] [employer] record.     Objective:   Physical Exam BP 114/64   Pulse 68   Ht 5\' 4"  (1.626 m)   Wt 205 lb 6.4 oz (93.2 kg)   BMI 35.26 kg/m  Constitutional: Well-developed and well-nourished. No distress. HEENT: Normocephalic and atraumatic.  Conjunctivae are normal.  No scleral icterus. Neck:  Neck supple. Trachea midline. Cardiovascular: Normal rate, regular rhythm and intact distal pulses.  Pulmonary/chest: Effort normal and breath sounds normal. No wheezing, rales or rhonchi. Abdominal: Soft, mild epigastric and upper abdominal tenderness without rebound or guarding, nondistended. Bowel sounds active throughout. There are no masses palpable. No hepatosplenomegaly. Extremities: no clubbing, cyanosis, or edema Neurological: Alert and oriented to person place and time. Skin: Skin is warm and dry. Psychiatric: Normal mood and affect. Behavior is normal.  ESOPHOGRAM / BARIUM SWALLOW / BARIUM TABLET STUDY   TECHNIQUE: Combined double contrast and single contrast examination performed using effervescent crystals, thick barium liquid, and thin barium liquid. The patient was observed with fluoroscopy swallowing a 13 mm barium sulphate tablet.   FLUOROSCOPY TIME:  Fluoroscopy Time:  1 min 18 sec   Radiation Exposure Index (if provided by the fluoroscopic device): 40 mGy   Number of Acquired Spot Images: 0   COMPARISON:  Chest CT 01/23/2016. cervical spine radiographs 11/03/2016.   FINDINGS: A double contrast study was undertaken and the patient tolerated this well and without difficulty.   No obstruction to the forward flow of contrast throughout the esophagus and into the stomach. Normal esophageal course and contour. Normal esophageal mucosal pattern.   Intermittent tertiary contractions occurred in the mid and distal esophagus when upright.   A 12.5 mm barium tablet was administered and passed freely to the stomach without delay (series 5).   Dedicated cervical esophagus imaging is normal. Sequelae of C4-C5 ACDF redemonstrated with no mass effect on the  cervical esophagus at that level. There is minimal mass effect on the lower cervical esophagus related to anterior endplate osteophytes at the C6-C7 level (series 7, image 8).   With prone swallows esophageal  motility is normal for age, and little to no tertiary contractions occurred.   The gastroesophageal junction is normal.   No gastroesophageal reflux occurred spontaneously or was elicited. Gastric emptying appears normal.   IMPRESSION: Overall normal esophagram; some tertiary contractions in the lower esophagus when upright, but normal esophageal motility otherwise.     Electronically Signed   By: Genevie Ann M.D.   On: 03/17/2017 11:08      Assessment & Plan:   53 year old female with a history of GERD, dyspepsia, upper abdominal bloating with pain, iron deficiency anemia, prior enteritis by CT scan not documented by upper endoscopy, colonoscopy or video capsule endoscopy who is here for follow-up to discuss upper abdominal pain, bloating and nausea  1.  Upper abdominal pain/bloating/nausea --previously evaluated.  Upper endoscopy rather unremarkable.  Gastric emptying study also normal.  This is likely irritable bowel, cannot exclude bacterial overgrowth.  I am going to treated with rifaximin for irritable bowel 550 mg 3 times daily x14 days.  I asked that she call me a couple weeks after therapy to let me know if symptoms have improved.  2.  GERD --we will continue pantoprazole 40 mg daily.  I suggested she change ranitidine to famotidine 20 mg in the evening on an as-needed basis for breakthrough heartburn symptom.  3.  Iron deficiency --history of.  She reports hemoglobin checked recently and 13.  She will continue daily oral iron.  4.  Colon cancer screening --normal colonoscopy 2015, repeat in 2025

## 2018-03-31 ENCOUNTER — Telehealth: Payer: Self-pay | Admitting: Internal Medicine

## 2018-03-31 NOTE — Telephone Encounter (Signed)
Insurance company called stating that the med rifaximin Doreene Nest) has been approved.

## 2018-03-31 NOTE — Telephone Encounter (Signed)
Noted  

## 2018-04-23 DIAGNOSIS — M7551 Bursitis of right shoulder: Secondary | ICD-10-CM | POA: Diagnosis not present

## 2018-04-23 DIAGNOSIS — G8918 Other acute postprocedural pain: Secondary | ICD-10-CM | POA: Diagnosis not present

## 2018-04-23 DIAGNOSIS — M75121 Complete rotator cuff tear or rupture of right shoulder, not specified as traumatic: Secondary | ICD-10-CM | POA: Diagnosis not present

## 2018-04-29 ENCOUNTER — Telehealth: Payer: Self-pay

## 2018-04-29 NOTE — Telephone Encounter (Signed)
   Patient was seen recently.  Treated with rifaximin.  Please see if this helped her symptoms in any way.  Please determine if bowel movements are regular particularly since she has had recent surgery

## 2018-04-30 ENCOUNTER — Other Ambulatory Visit: Payer: Self-pay

## 2018-04-30 DIAGNOSIS — R109 Unspecified abdominal pain: Secondary | ICD-10-CM

## 2018-04-30 NOTE — Telephone Encounter (Signed)
If persistent abdominal pain would recommend CBC, CMP followed by CT enterography to rule out enteritis which has been previously suggested by remote scan

## 2018-05-01 ENCOUNTER — Other Ambulatory Visit (INDEPENDENT_AMBULATORY_CARE_PROVIDER_SITE_OTHER): Payer: BLUE CROSS/BLUE SHIELD

## 2018-05-01 DIAGNOSIS — R05 Cough: Secondary | ICD-10-CM | POA: Diagnosis not present

## 2018-05-01 DIAGNOSIS — R109 Unspecified abdominal pain: Secondary | ICD-10-CM

## 2018-05-01 LAB — COMPREHENSIVE METABOLIC PANEL
ALT: 14 U/L (ref 0–35)
AST: 17 U/L (ref 0–37)
Albumin: 4 g/dL (ref 3.5–5.2)
Alkaline Phosphatase: 77 U/L (ref 39–117)
BUN: 12 mg/dL (ref 6–23)
CO2: 32 mEq/L (ref 19–32)
Calcium: 9.4 mg/dL (ref 8.4–10.5)
Chloride: 103 mEq/L (ref 96–112)
Creatinine, Ser: 1.11 mg/dL (ref 0.40–1.20)
GFR: 62.36 mL/min (ref >60.00)
Glucose, Bld: 96 mg/dL (ref 70–99)
Potassium: 4.2 mEq/L (ref 3.5–5.1)
Sodium: 142 mEq/L (ref 135–145)
Total Bilirubin: 0.8 mg/dL (ref 0.2–1.2)
Total Protein: 7.5 g/dL (ref 6.0–8.3)

## 2018-05-01 LAB — CBC WITH DIFFERENTIAL/PLATELET
Basophils Absolute: 0.1 10*3/uL (ref 0.0–0.1)
Basophils Relative: 1.6 % (ref 0.0–3.0)
Eosinophils Absolute: 0.2 10*3/uL (ref 0.0–0.7)
Eosinophils Relative: 2.4 % (ref 0.0–5.0)
HCT: 40.1 % (ref 36.0–46.0)
Hemoglobin: 13.3 g/dL (ref 12.0–15.0)
Lymphocytes Relative: 31.9 % (ref 12.0–46.0)
Lymphs Abs: 2.2 10*3/uL (ref 0.7–4.0)
MCHC: 33.1 g/dL (ref 30.0–36.0)
MCV: 85.8 fl (ref 78.0–100.0)
Monocytes Absolute: 0.4 10*3/uL (ref 0.1–1.0)
Monocytes Relative: 5.9 % (ref 3.0–12.0)
Neutro Abs: 4.1 10*3/uL (ref 1.4–7.7)
Neutrophils Relative %: 58.2 % (ref 43.0–77.0)
Platelets: 240 10*3/uL (ref 150.0–400.0)
RBC: 4.68 Mil/uL (ref 3.87–5.11)
RDW: 13 % (ref 11.5–15.5)
WBC: 7 10*3/uL (ref 4.0–10.5)

## 2018-05-11 DIAGNOSIS — M75121 Complete rotator cuff tear or rupture of right shoulder, not specified as traumatic: Secondary | ICD-10-CM | POA: Diagnosis not present

## 2018-05-19 IMAGING — MR MR LUMBAR SPINE WO/W CM
4 of 7 series · 22 of 48 positions shown · IV contrast (20 ml multihance)
Comparison: 11/01/2015 CT of abdomen and pelvis.

CLINICAL DATA: 49 y/o F; diffuse body pain greatest in the central
lower back and both legs.

EXAM:
MRI LUMBAR SPINE WITHOUT AND WITH CONTRAST
TECHNIQUE: Multiplanar and multiecho pulse sequences of the lumbar spine were
obtained without and with intravenous contrast.
CONTRAST:  20mL MULTIHANCE GADOBENATE DIMEGLUMINE 529 MG/ML IV SOLN

[Series 6: T1 · sagittal · 4.0mm · 0.70mm/px · 4 of 15 slices shown (1 of 2)]
[im 1/15]
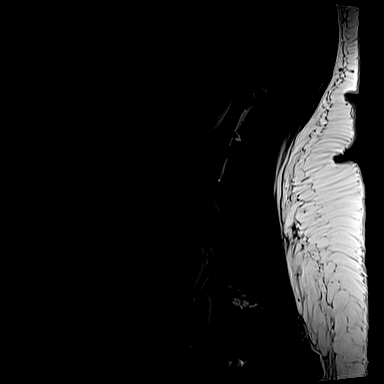
[im 5/15]
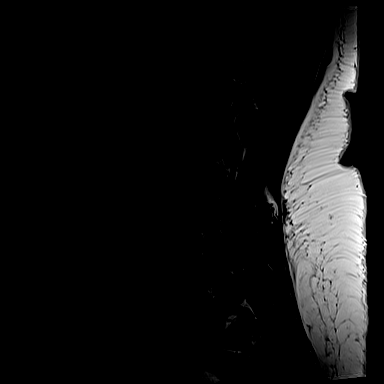
[im 10/15]
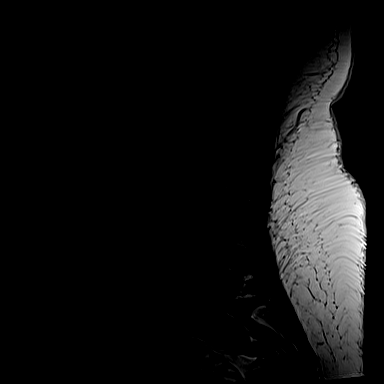
[im 15/15]
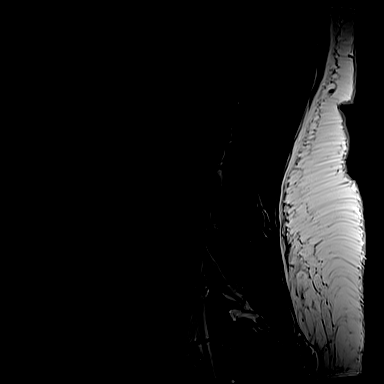

[Series 8: T2 · axial · 4.0mm · 0.28mm/px · z∈[-45,+139]mm · 8 of 33 slices shown (1 of 2)]
[im 1/33]
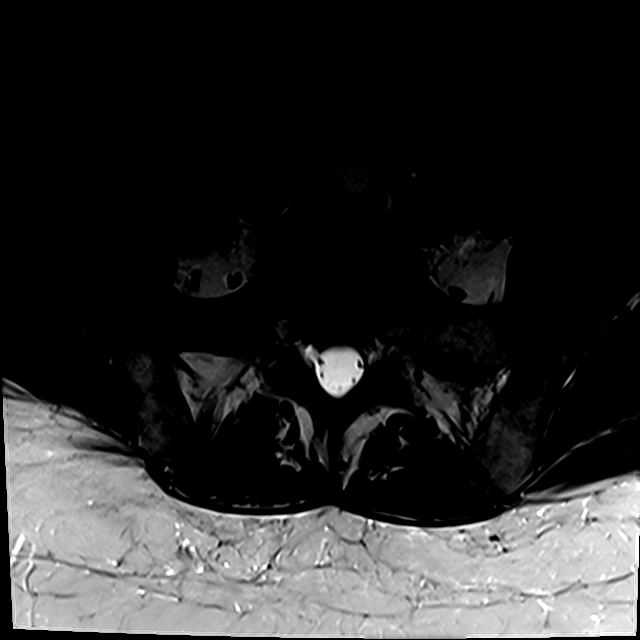
[im 4/33]
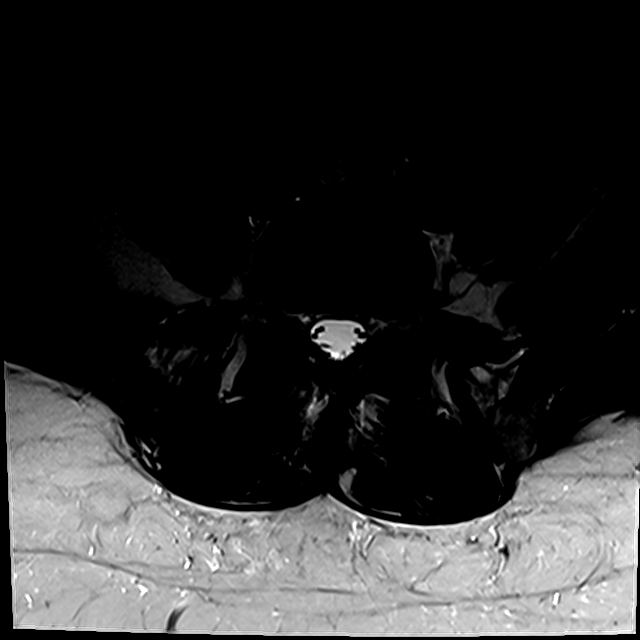
[im 11/33]
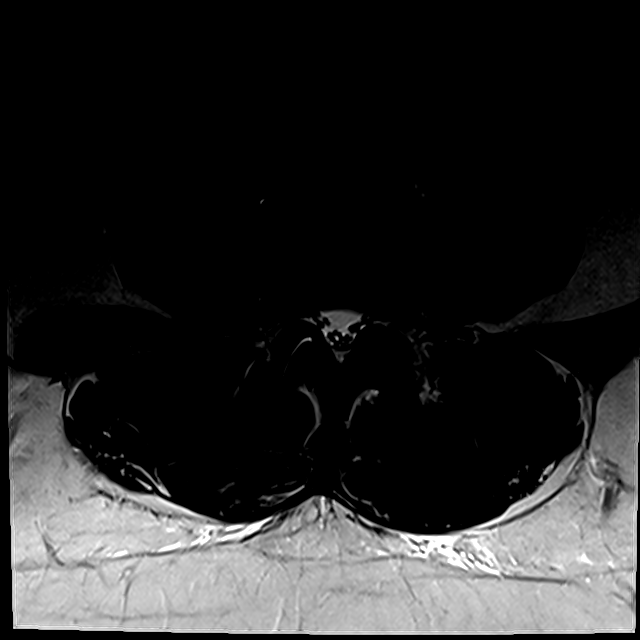
[im 15/33]
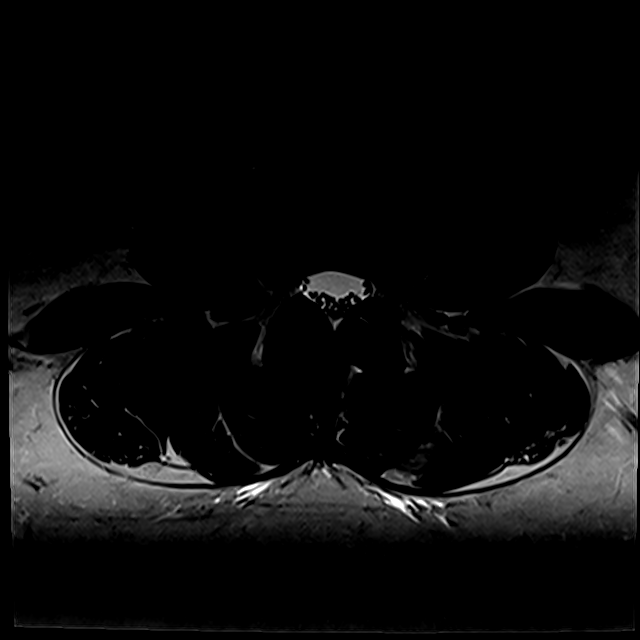
[im 18/33]
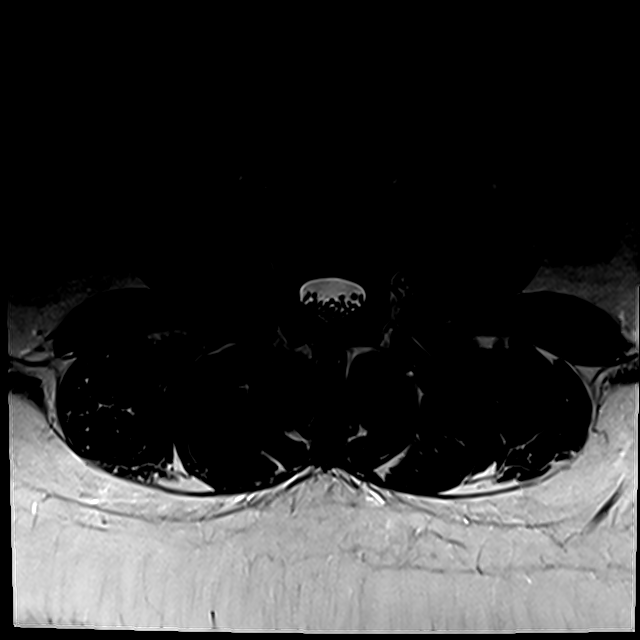
[im 22/33]
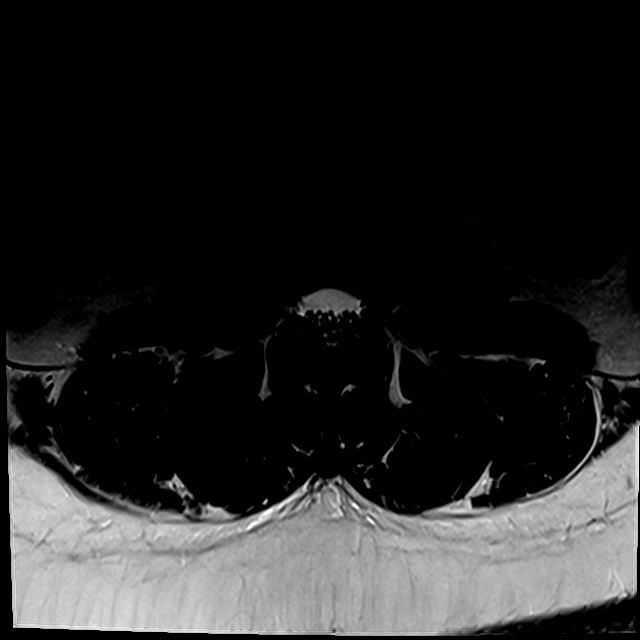
[im 29/33]
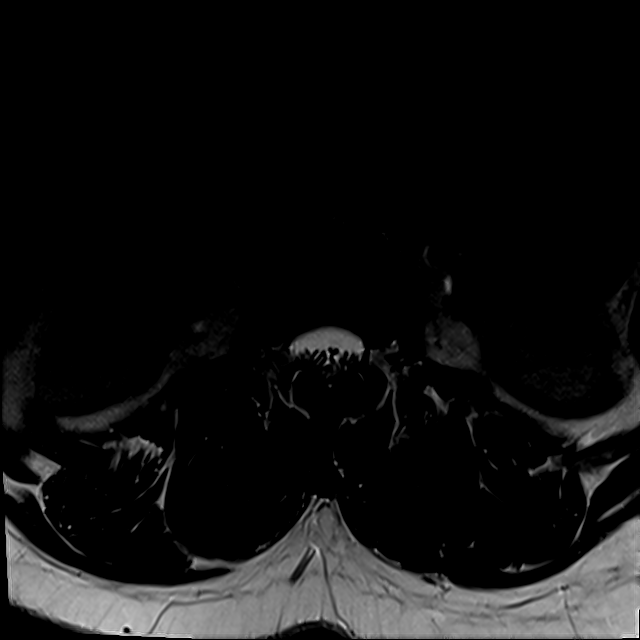
[im 33/33]
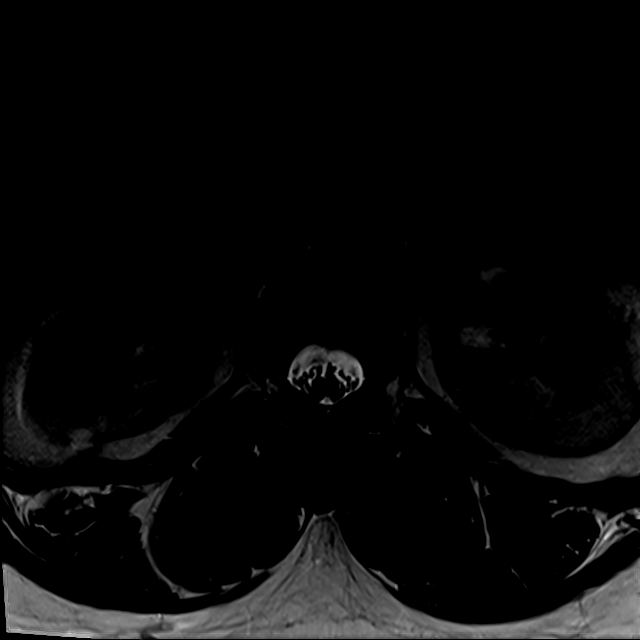

[Series 9: T1 · axial · 4.0mm · 0.56mm/px · z∈[-45,+119]mm · 5 of 33 slices shown (2 of 2)]
[im 1/33]
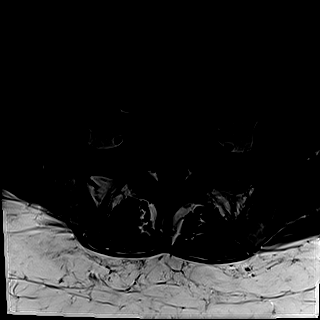
[im 4/33]
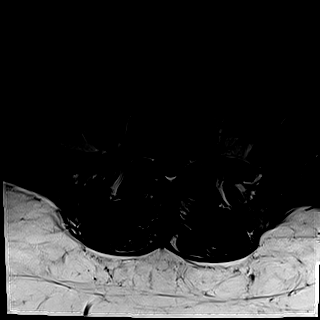
[im 11/33]
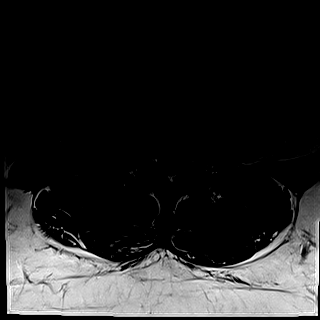
[im 18/33]
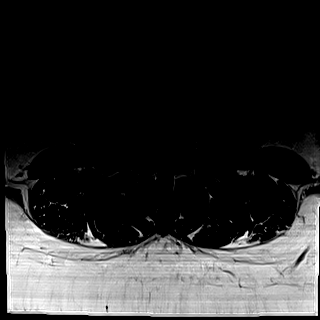
[im 29/33]
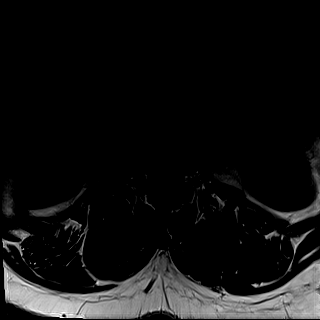

[Series 11: T2 · sagittal · 4.0mm · 0.70mm/px · 5 of 15 slices shown (2 of 2)]
[im 1/15]
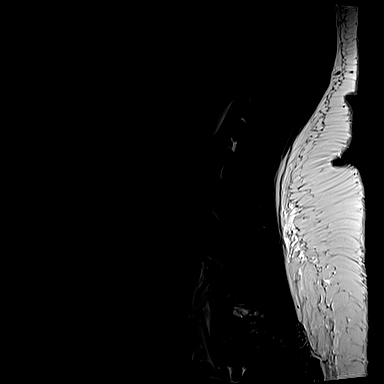
[im 4/15]
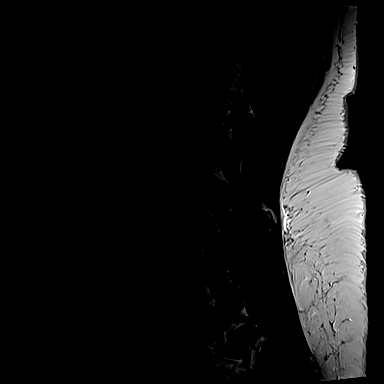
[im 8/15]
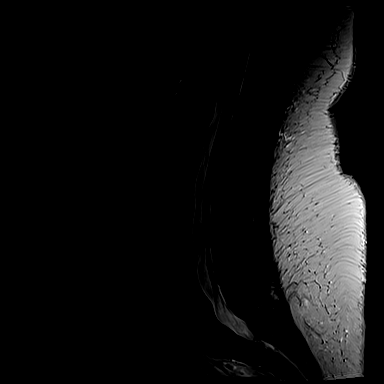
[im 11/15]
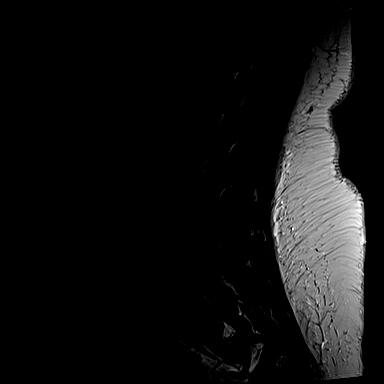
[im 15/15]
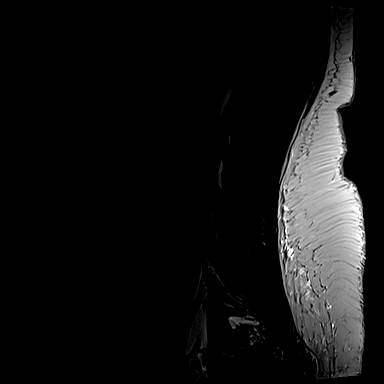

[22 of 48 positions shown; findings below may reference images not displayed]

FINDINGS: Segmentation:  Standard.

Alignment:  Physiologic.

Vertebrae:  No fracture, evidence of discitis, or bone lesion.

Conus medullaris: Extends to the L1 level and appears normal.

Paraspinal and other soft tissues: Negative.

Disc levels:

Disc desiccation and mild disc space narrowing at L4-5 and L5-S1.

L1-2: No significant disc displacement, foraminal narrowing, or
canal stenosis.

L2-3: Minimal disc bulge and mild facet hypertrophy. No significant
foraminal narrowing or canal stenosis.

L3-4: Minimal disc bulge and mild ligamentum flavum and facet
hypertrophy. No significant foraminal narrowing or canal stenosis.

L4-5: Small disc bulge mild facet and moderate ligamentum flavum
hypertrophy. Mild bilateral foraminal narrowing. No significant
canal stenosis. Trace right facet effusion.

L5-S1: Small disc bulge, mild facet and ligamentum flavum
hypertrophy. Trace bilateral facet effusions. Mild bilateral
foraminal narrowing. No significant canal stenosis.
IMPRESSION: 1. Mild lumbar spondylosis and facet arthropathy with trace facet
effusions of the lower lumbar spine. Mild bilateral foraminal
narrowing of L4 through S1. No significant canal stenosis. No
evidence for neural impingement.
2. No acute osseous abnormality or abnormal enhancement.

By: Laaouina Tiger M.D.

## 2018-06-01 DIAGNOSIS — M75121 Complete rotator cuff tear or rupture of right shoulder, not specified as traumatic: Secondary | ICD-10-CM | POA: Diagnosis not present

## 2018-06-03 DIAGNOSIS — R0982 Postnasal drip: Secondary | ICD-10-CM | POA: Diagnosis not present

## 2018-06-04 DIAGNOSIS — M75101 Unspecified rotator cuff tear or rupture of right shoulder, not specified as traumatic: Secondary | ICD-10-CM | POA: Diagnosis not present

## 2018-06-09 DIAGNOSIS — M75101 Unspecified rotator cuff tear or rupture of right shoulder, not specified as traumatic: Secondary | ICD-10-CM | POA: Diagnosis not present

## 2018-06-10 DIAGNOSIS — M75101 Unspecified rotator cuff tear or rupture of right shoulder, not specified as traumatic: Secondary | ICD-10-CM | POA: Diagnosis not present

## 2018-06-17 DIAGNOSIS — M75101 Unspecified rotator cuff tear or rupture of right shoulder, not specified as traumatic: Secondary | ICD-10-CM | POA: Diagnosis not present

## 2018-06-22 DIAGNOSIS — M75101 Unspecified rotator cuff tear or rupture of right shoulder, not specified as traumatic: Secondary | ICD-10-CM | POA: Diagnosis not present

## 2018-06-22 DIAGNOSIS — H9202 Otalgia, left ear: Secondary | ICD-10-CM | POA: Diagnosis not present

## 2018-06-22 DIAGNOSIS — J301 Allergic rhinitis due to pollen: Secondary | ICD-10-CM | POA: Diagnosis not present

## 2018-06-22 DIAGNOSIS — M2669 Other specified disorders of temporomandibular joint: Secondary | ICD-10-CM | POA: Diagnosis not present

## 2018-06-22 DIAGNOSIS — R6889 Other general symptoms and signs: Secondary | ICD-10-CM | POA: Diagnosis not present

## 2018-06-25 DIAGNOSIS — M75101 Unspecified rotator cuff tear or rupture of right shoulder, not specified as traumatic: Secondary | ICD-10-CM | POA: Diagnosis not present

## 2018-06-29 DIAGNOSIS — Z9889 Other specified postprocedural states: Secondary | ICD-10-CM | POA: Diagnosis not present

## 2018-06-29 DIAGNOSIS — M75101 Unspecified rotator cuff tear or rupture of right shoulder, not specified as traumatic: Secondary | ICD-10-CM | POA: Diagnosis not present

## 2018-07-01 DIAGNOSIS — M75101 Unspecified rotator cuff tear or rupture of right shoulder, not specified as traumatic: Secondary | ICD-10-CM | POA: Diagnosis not present

## 2018-07-06 DIAGNOSIS — M75101 Unspecified rotator cuff tear or rupture of right shoulder, not specified as traumatic: Secondary | ICD-10-CM | POA: Diagnosis not present

## 2018-07-08 DIAGNOSIS — M75101 Unspecified rotator cuff tear or rupture of right shoulder, not specified as traumatic: Secondary | ICD-10-CM | POA: Diagnosis not present

## 2018-07-14 DIAGNOSIS — M75101 Unspecified rotator cuff tear or rupture of right shoulder, not specified as traumatic: Secondary | ICD-10-CM | POA: Diagnosis not present

## 2018-07-15 ENCOUNTER — Other Ambulatory Visit: Payer: Self-pay

## 2018-07-15 ENCOUNTER — Ambulatory Visit
Admission: RE | Admit: 2018-07-15 | Discharge: 2018-07-15 | Disposition: A | Discharge status: Home / Self Care | Payer: BLUE CROSS/BLUE SHIELD | Source: Ambulatory Visit | Attending: Physician Assistant | Admitting: Physician Assistant

## 2018-07-15 DIAGNOSIS — Z1231 Encounter for screening mammogram for malignant neoplasm of breast: Secondary | ICD-10-CM | POA: Diagnosis not present

## 2018-07-16 DIAGNOSIS — M75101 Unspecified rotator cuff tear or rupture of right shoulder, not specified as traumatic: Secondary | ICD-10-CM | POA: Diagnosis not present

## 2018-07-21 DIAGNOSIS — M75101 Unspecified rotator cuff tear or rupture of right shoulder, not specified as traumatic: Secondary | ICD-10-CM | POA: Diagnosis not present

## 2018-07-29 DIAGNOSIS — Z9889 Other specified postprocedural states: Secondary | ICD-10-CM | POA: Diagnosis not present

## 2018-07-29 DIAGNOSIS — M75101 Unspecified rotator cuff tear or rupture of right shoulder, not specified as traumatic: Secondary | ICD-10-CM | POA: Diagnosis not present

## 2018-08-20 ENCOUNTER — Telehealth: Payer: Self-pay

## 2018-08-20 ENCOUNTER — Other Ambulatory Visit: Payer: Self-pay

## 2018-08-20 DIAGNOSIS — R109 Unspecified abdominal pain: Secondary | ICD-10-CM

## 2018-08-20 NOTE — Telephone Encounter (Signed)
-----   Message from Algernon Huxley, RN sent at 06/05/2018  2:21 PM EDT ----- Regarding: CT entero Pt had shoulder surgery and could not do earlier. She called back to schedule and covid-19 scheduling prohibited. Pt needs to schedule now.

## 2018-08-20 NOTE — Telephone Encounter (Signed)
Pt to come for labs prior to CT entero, orders in epic. Pt scheduled for CT entero of A/P at [facility] CT 09/08/18@3 :45pm. Pt to arrive there at 2pm to drink contrast. She should have no solids 4 hours prior to scan. Pt aware.

## 2018-09-08 ENCOUNTER — Other Ambulatory Visit: Payer: BLUE CROSS/BLUE SHIELD

## 2018-09-08 DIAGNOSIS — R202 Paresthesia of skin: Secondary | ICD-10-CM | POA: Diagnosis not present

## 2018-09-15 DIAGNOSIS — R202 Paresthesia of skin: Secondary | ICD-10-CM | POA: Diagnosis not present

## 2018-09-25 ENCOUNTER — Other Ambulatory Visit (INDEPENDENT_AMBULATORY_CARE_PROVIDER_SITE_OTHER): Payer: BC Managed Care – PPO

## 2018-09-25 DIAGNOSIS — R109 Unspecified abdominal pain: Secondary | ICD-10-CM

## 2018-09-25 LAB — BUN: BUN: 12 mg/dL (ref 6–23)

## 2018-09-25 LAB — CREATININE, SERUM: Creatinine, Ser: 1.09 mg/dL (ref 0.40–1.20)

## 2018-09-29 ENCOUNTER — Other Ambulatory Visit: Payer: Self-pay

## 2018-09-29 ENCOUNTER — Ambulatory Visit
Admission: RE | Admit: 2018-09-29 | Discharge: 2018-09-29 | Disposition: A | Discharge status: Home / Self Care | Payer: BC Managed Care – PPO | Source: Ambulatory Visit | Attending: Internal Medicine | Admitting: Internal Medicine

## 2018-09-29 DIAGNOSIS — R109 Unspecified abdominal pain: Secondary | ICD-10-CM

## 2018-09-29 MED ORDER — IOHEXOL 300 MG/ML  SOLN: 100.0000 mL | Freq: Once | INTRAMUSCULAR | Status: DC | PRN

## 2018-10-23 ENCOUNTER — Telehealth: Payer: Self-pay | Admitting: Internal Medicine

## 2018-10-23 NOTE — Telephone Encounter (Signed)
Attempted to return call to [facility], left message. If the staff is unable to successfully place an IV, another possible option is to schedule CT at hospital to have access to the IV team who is capable of gaining access on patients that are difficult sticks.

## 2018-10-23 NOTE — Telephone Encounter (Signed)
Spoke to [place], told Stacy to schedule the patient at the hospital so they could have access to the IV team. Marzetta Board agreed and reported if [place] CT could not establish access, she would be rescheduled at the hospital.

## 2018-10-27 ENCOUNTER — Ambulatory Visit (INDEPENDENT_AMBULATORY_CARE_PROVIDER_SITE_OTHER)
Admission: RE | Admit: 2018-10-27 | Discharge: 2018-10-27 | Disposition: A | Discharge status: Home / Self Care | Payer: BC Managed Care – PPO | Source: Ambulatory Visit | Attending: Internal Medicine | Admitting: Internal Medicine

## 2018-10-27 ENCOUNTER — Other Ambulatory Visit: Payer: Self-pay

## 2018-10-27 DIAGNOSIS — R109 Unspecified abdominal pain: Secondary | ICD-10-CM

## 2018-10-27 DIAGNOSIS — K219 Gastro-esophageal reflux disease without esophagitis: Secondary | ICD-10-CM | POA: Diagnosis not present

## 2018-10-27 DIAGNOSIS — R1010 Upper abdominal pain, unspecified: Secondary | ICD-10-CM | POA: Diagnosis not present

## 2018-10-27 MED ORDER — IOHEXOL 300 MG/ML  SOLN
100.0000 mL | Freq: Once | INTRAMUSCULAR | Status: AC | PRN
  Administered 2018-10-27: 16:00:00 100 mL via INTRAVENOUS

## 2018-11-17 DIAGNOSIS — M797 Fibromyalgia: Secondary | ICD-10-CM | POA: Diagnosis not present

## 2018-11-17 DIAGNOSIS — K219 Gastro-esophageal reflux disease without esophagitis: Secondary | ICD-10-CM | POA: Diagnosis not present

## 2018-11-17 DIAGNOSIS — E119 Type 2 diabetes mellitus without complications: Secondary | ICD-10-CM | POA: Diagnosis not present

## 2018-11-17 DIAGNOSIS — I10 Essential (primary) hypertension: Secondary | ICD-10-CM | POA: Diagnosis not present

## 2018-11-23 DIAGNOSIS — R220 Localized swelling, mass and lump, head: Secondary | ICD-10-CM | POA: Diagnosis not present

## 2018-12-15 DIAGNOSIS — E119 Type 2 diabetes mellitus without complications: Secondary | ICD-10-CM | POA: Diagnosis not present

## 2019-02-22 ENCOUNTER — Ambulatory Visit: Payer: BC Managed Care – PPO | Attending: Internal Medicine

## 2019-02-22 DIAGNOSIS — Z20822 Contact with and (suspected) exposure to covid-19: Secondary | ICD-10-CM

## 2019-02-23 LAB — NOVEL CORONAVIRUS, NAA: SARS-CoV-2, NAA: DETECTED — AB

## 2019-02-24 ENCOUNTER — Telehealth: Payer: Self-pay | Admitting: *Deleted

## 2019-02-24 ENCOUNTER — Telehealth: Payer: Self-pay

## 2019-02-24 NOTE — Telephone Encounter (Signed)
Pt returned call with fax number.   [phone]

## 2019-02-24 NOTE — Telephone Encounter (Signed)
Fax [phone].

## 2019-02-24 NOTE — Telephone Encounter (Signed)
Please fax result to [place]: Ilda Basset Fax:  Patient will callback with number

## 2019-03-24 ENCOUNTER — Other Ambulatory Visit: Payer: Self-pay | Admitting: Physician Assistant

## 2019-03-24 DIAGNOSIS — Z1231 Encounter for screening mammogram for malignant neoplasm of breast: Secondary | ICD-10-CM

## 2019-04-06 DIAGNOSIS — M25511 Pain in right shoulder: Secondary | ICD-10-CM | POA: Diagnosis not present

## 2019-04-06 DIAGNOSIS — M2550 Pain in unspecified joint: Secondary | ICD-10-CM | POA: Diagnosis not present

## 2019-04-06 DIAGNOSIS — M25512 Pain in left shoulder: Secondary | ICD-10-CM | POA: Diagnosis not present

## 2019-04-26 ENCOUNTER — Other Ambulatory Visit: Payer: Self-pay

## 2019-04-26 ENCOUNTER — Ambulatory Visit: Payer: BC Managed Care – PPO | Admitting: Family Medicine

## 2019-04-26 ENCOUNTER — Encounter: Payer: Self-pay | Admitting: Family Medicine

## 2019-04-26 VITALS — BP 120/84 | HR 58 | Temp 99.3°F | Ht 64.0 in | Wt 211.8 lb

## 2019-04-26 DIAGNOSIS — I10 Essential (primary) hypertension: Secondary | ICD-10-CM | POA: Diagnosis not present

## 2019-04-26 DIAGNOSIS — E119 Type 2 diabetes mellitus without complications: Secondary | ICD-10-CM

## 2019-04-26 DIAGNOSIS — E559 Vitamin D deficiency, unspecified: Secondary | ICD-10-CM | POA: Insufficient documentation

## 2019-04-26 DIAGNOSIS — D509 Iron deficiency anemia, unspecified: Secondary | ICD-10-CM | POA: Diagnosis not present

## 2019-04-26 DIAGNOSIS — K219 Gastro-esophageal reflux disease without esophagitis: Secondary | ICD-10-CM

## 2019-04-26 DIAGNOSIS — M797 Fibromyalgia: Secondary | ICD-10-CM | POA: Insufficient documentation

## 2019-04-26 LAB — POCT GLYCOSYLATED HEMOGLOBIN (HGB A1C): Hemoglobin A1C: 6.7 % — AB (ref 4.0–5.6)

## 2019-04-26 NOTE — Progress Notes (Signed)
Subjective:    Patient ID: Ernesta Amble, female    DOB: [birth year], 54 y.o.   MRN: [id number]  HPI Chief Complaint  Patient presents with  . new pt    new pt, get established. diabetes. checkes sugars twice a day   She is new to the practice and here to establish care.  Previous medical care: [facility]. Dr. Hilda Blades (need records)  Other providers: Dr. Amil Amen - fibromyalgia  Dr. Reather Laurence- dentist Dr. Hilarie Fredrickson- GI Dr. Warren Danes- ENT  Dr. Norm Salt- ortho    Diabetes- diagnosed 3 years ago and her Hgb A1c was 7.0%. states she had prediabetes up until.  Started on Metformin 500 mg ER and doing well on this. She did not tolerate metformin IR Checks BS at home  Denies having complications from diabetes in the past. Has never been on insulin   HTN- diagnosed several years ago, before diabetes. Is doing fine with her medication and BP in normal range per patient.   HL- reports taking atorvastatin for the past year due to diabetes.   GERD- protonix daily. History of EGD with Dr. Hilarie Fredrickson  Vitamin D def- taking a supplement   Fibromyalgia- gabapentin. Prescribed by Dr. Mercer Pod- some nights    Anemia - unknown source. Taking iron daily  Colonoscopy UTD per patient   Stopped having periods 3 years ago   Last had labs over 6 months ago.    Rotator cuff surgery- right side March 2020.   Single. 2 kids and a grand baby. Works at [employer]  Denies smoking, drinking alcohol, drug use  Diet: fairly healthy  Excerise: nothing regular    Reviewed allergies, medications, past medical, surgical, family, and social history.    Review of Systems Pertinent positives and negatives in the history of present illness.     Objective:   Physical Exam BP 120/84   Pulse (!) 58   Temp 99.3 F (37.4 C)   Ht 5\' 4"  (1.626 m)   Wt 211 lb 12.8 oz (96.1 kg)   BMI 36.36 kg/m   Alert and in no distress. Cardiac exam shows a regular rhythm without murmurs or gallops. Lungs  are clear to auscultation. Extremities without edema. Skin is warm and dry.        Assessment & Plan:  Controlled type 2 diabetes mellitus without complication, without long-term current use of insulin (HCC) - Plan: CBC with Differential/Platelet, Comprehensive metabolic panel, Lipid panel, POCT glycosylated hemoglobin (Hb A1C) -Hemoglobin A1c is 6.7% today and her diabetes is well controlled.  She will continue on Metformin ER and continue checking her blood sugars at home.  Continue with low-carb, low sugar diet Reports taking a statin for diabetes.  Unclear as to if her cholesterol is ever been elevated.  She is fasting we will check lipid panel. I will request records from PCP at [place] hypertension -Blood pressure controlled.  Continue on current medications.  Counseling on low-sodium diet and exercise.  Gastroesophageal reflux disease, unspecified whether esophagitis present -Appears to be manageable with Protonix.  Avoid food triggers.  Has seen Dr. Hilarie Fredrickson for this.  History of EGD  Vitamin D deficiency - Plan: VITAMIN D 25 Hydroxy (Vit-D Deficiency, Fractures) -She is taking a daily supplement, unclear of the dose.  Check vitamin D level and adjust dose as appropriate.  Microcytic anemia - Plan: CBC with Differential/Platelet, Iron, TIBC and Ferritin Panel -Unclear etiology.  Up-to-date with colonoscopy.  No periods in 3 years.  She continues taking  iron.  Check CBC and iron studies and follow-up.  Fibromyalgia-she sees Dr. Amil Amen for this.  Takes gabapentin.

## 2019-04-26 NOTE — Patient Instructions (Signed)
It was a pleasure meeting you today.  Your hemoglobin A1c is 6.7% and your diabetes is well controlled.  Continue on your current medications.  Continue to keep an eye on your blood sugar and blood pressure.  We will be in touch with your lab results

## 2019-04-27 LAB — COMPREHENSIVE METABOLIC PANEL
ALT: 18 IU/L (ref 0–32)
AST: 25 IU/L (ref 0–40)
Albumin/Globulin Ratio: 1.6 (ref 1.2–2.2)
Albumin: 4.2 g/dL (ref 3.8–4.9)
Alkaline Phosphatase: 94 IU/L (ref 39–117)
BUN/Creatinine Ratio: 9 (ref 9–23)
BUN: 10 mg/dL (ref 6–24)
Bilirubin Total: 0.6 mg/dL (ref 0.0–1.2)
CO2: 27 mmol/L (ref 20–29)
Calcium: 9.7 mg/dL (ref 8.7–10.2)
Chloride: 102 mmol/L (ref 96–106)
Creatinine, Ser: 1.12 mg/dL — ABNORMAL HIGH (ref 0.57–1.00)
GFR calc Af Amer: 65 mL/min/{1.73_m2} (ref >59)
GFR calc non Af Amer: 56 mL/min/{1.73_m2} — ABNORMAL LOW (ref >59)
Globulin, Total: 2.7 g/dL (ref 1.5–4.5)
Glucose: 87 mg/dL (ref 65–99)
Potassium: 4.3 mmol/L (ref 3.5–5.2)
Sodium: 143 mmol/L (ref 134–144)
Total Protein: 6.9 g/dL (ref 6.0–8.5)

## 2019-04-27 LAB — CBC WITH DIFFERENTIAL/PLATELET
Basophils Absolute: 0 10*3/uL (ref 0.0–0.2)
Basos: 1 %
EOS (ABSOLUTE): 0.1 10*3/uL (ref 0.0–0.4)
Eos: 2 %
Hematocrit: 40.9 % (ref 34.0–46.6)
Hemoglobin: 13.5 g/dL (ref 11.1–15.9)
Immature Grans (Abs): 0 10*3/uL (ref 0.0–0.1)
Immature Granulocytes: 0 %
Lymphocytes Absolute: 2.2 10*3/uL (ref 0.7–3.1)
Lymphs: 40 %
MCH: 27.8 pg (ref 26.6–33.0)
MCHC: 33 g/dL (ref 31.5–35.7)
MCV: 84 fL (ref 79–97)
Monocytes Absolute: 0.3 10*3/uL (ref 0.1–0.9)
Monocytes: 5 %
Neutrophils Absolute: 2.9 10*3/uL (ref 1.4–7.0)
Neutrophils: 52 %
Platelets: 222 10*3/uL (ref 150–450)
RBC: 4.86 x10E6/uL (ref 3.77–5.28)
RDW: 13 % (ref 11.7–15.4)
WBC: 5.6 10*3/uL (ref 3.4–10.8)

## 2019-04-27 LAB — LIPID PANEL
Chol/HDL Ratio: 1.9 ratio (ref 0.0–4.4)
Cholesterol, Total: 166 mg/dL (ref 100–199)
HDL: 88 mg/dL (ref >39)
LDL Chol Calc (NIH): 68 mg/dL (ref 0–99)
Triglycerides: 50 mg/dL (ref 0–149)
VLDL Cholesterol Cal: 10 mg/dL (ref 5–40)

## 2019-04-27 LAB — IRON,TIBC AND FERRITIN PANEL
Ferritin: 89 ng/mL (ref 15–150)
Iron Saturation: 24 % (ref 15–55)
Iron: 83 ug/dL (ref 27–159)
Total Iron Binding Capacity: 344 ug/dL (ref 250–450)
UIBC: 261 ug/dL (ref 131–425)

## 2019-04-27 LAB — VITAMIN D 25 HYDROXY (VIT D DEFICIENCY, FRACTURES): Vit D, 25-Hydroxy: 31.6 ng/mL (ref 30.0–100.0)

## 2019-05-31 DIAGNOSIS — M542 Cervicalgia: Secondary | ICD-10-CM | POA: Diagnosis not present

## 2019-05-31 DIAGNOSIS — M25512 Pain in left shoulder: Secondary | ICD-10-CM | POA: Diagnosis not present

## 2019-05-31 DIAGNOSIS — M545 Low back pain: Secondary | ICD-10-CM | POA: Diagnosis not present

## 2019-06-03 ENCOUNTER — Other Ambulatory Visit: Payer: Self-pay | Admitting: Obstetrics and Gynecology

## 2019-06-03 DIAGNOSIS — D259 Leiomyoma of uterus, unspecified: Secondary | ICD-10-CM | POA: Diagnosis not present

## 2019-06-03 DIAGNOSIS — Z6834 Body mass index (BMI) 34.0-34.9, adult: Secondary | ICD-10-CM | POA: Diagnosis not present

## 2019-06-25 ENCOUNTER — Other Ambulatory Visit: Payer: Self-pay | Admitting: Internal Medicine

## 2019-06-25 MED ORDER — LISINOPRIL-HYDROCHLOROTHIAZIDE 20-12.5 MG PO TABS: 1.0000 | ORAL_TABLET | Freq: Every day | ORAL | 2 refills | Status: DC

## 2019-06-25 MED ORDER — METFORMIN HCL ER 500 MG PO TB24: 500.0000 mg | ORAL_TABLET | Freq: Every day | ORAL | 2 refills | Status: DC

## 2019-06-30 DIAGNOSIS — D259 Leiomyoma of uterus, unspecified: Secondary | ICD-10-CM | POA: Diagnosis not present

## 2019-07-12 DIAGNOSIS — M25512 Pain in left shoulder: Secondary | ICD-10-CM | POA: Diagnosis not present

## 2019-07-12 DIAGNOSIS — M545 Low back pain: Secondary | ICD-10-CM | POA: Diagnosis not present

## 2019-07-16 ENCOUNTER — Ambulatory Visit: Payer: BC Managed Care – PPO

## 2019-07-26 ENCOUNTER — Other Ambulatory Visit: Payer: Self-pay | Admitting: Orthopedic Surgery

## 2019-07-26 DIAGNOSIS — M25511 Pain in right shoulder: Secondary | ICD-10-CM

## 2019-07-27 ENCOUNTER — Other Ambulatory Visit: Payer: Self-pay | Admitting: Orthopaedic Surgery

## 2019-07-27 DIAGNOSIS — M25512 Pain in left shoulder: Secondary | ICD-10-CM

## 2019-08-10 DIAGNOSIS — Z6832 Body mass index (BMI) 32.0-32.9, adult: Secondary | ICD-10-CM | POA: Diagnosis not present

## 2019-08-10 DIAGNOSIS — Z1231 Encounter for screening mammogram for malignant neoplasm of breast: Secondary | ICD-10-CM | POA: Diagnosis not present

## 2019-08-10 DIAGNOSIS — Z1151 Encounter for screening for human papillomavirus (HPV): Secondary | ICD-10-CM | POA: Diagnosis not present

## 2019-08-10 DIAGNOSIS — Z01419 Encounter for gynecological examination (general) (routine) without abnormal findings: Secondary | ICD-10-CM | POA: Diagnosis not present

## 2019-08-10 LAB — HM MAMMOGRAPHY

## 2019-08-10 LAB — RESULTS CONSOLE HPV: CHL HPV: NEGATIVE

## 2019-08-10 LAB — HM PAP SMEAR: HM Pap smear: NEGATIVE

## 2019-08-27 ENCOUNTER — Other Ambulatory Visit: Payer: BC Managed Care – PPO

## 2019-09-01 ENCOUNTER — Other Ambulatory Visit: Payer: Self-pay

## 2019-09-01 ENCOUNTER — Ambulatory Visit
Admission: RE | Admit: 2019-09-01 | Discharge: 2019-09-01 | Disposition: A | Discharge status: Home / Self Care | Payer: BC Managed Care – PPO | Source: Ambulatory Visit | Attending: Orthopaedic Surgery | Admitting: Orthopaedic Surgery

## 2019-09-01 DIAGNOSIS — M25512 Pain in left shoulder: Secondary | ICD-10-CM

## 2019-09-01 DIAGNOSIS — M75122 Complete rotator cuff tear or rupture of left shoulder, not specified as traumatic: Secondary | ICD-10-CM | POA: Diagnosis not present

## 2019-09-01 DIAGNOSIS — M19012 Primary osteoarthritis, left shoulder: Secondary | ICD-10-CM | POA: Diagnosis not present

## 2019-09-13 DIAGNOSIS — M25512 Pain in left shoulder: Secondary | ICD-10-CM | POA: Diagnosis not present

## 2019-09-26 ENCOUNTER — Other Ambulatory Visit: Payer: Self-pay | Admitting: Family Medicine

## 2019-11-10 ENCOUNTER — Telehealth: Payer: Self-pay | Admitting: Internal Medicine

## 2019-11-10 NOTE — Telephone Encounter (Signed)
Girtha Rm, NP-C  [place], Jamari Moten A, CMA Looks like she needs a follow up. It has been 6 months since her diabetes check     Left message for pt to call back to schedule an appt

## 2019-11-22 ENCOUNTER — Ambulatory Visit: Payer: BC Managed Care – PPO | Admitting: Family Medicine

## 2019-11-25 NOTE — Progress Notes (Signed)
Subjective:    Patient ID: Gabriela Lowe, female    DOB: 08-28-1965, 54 y.o.   MRN: [id number]  Gabriela Lowe is a 54 y.o. female who presents for follow-up of Type 2 diabetes mellitus.  Other providers: Dr. Amil Amen - fibromyalgia  Dr. Reather Laurence- dentist Dr. Hilarie Fredrickson- GI Dr. Warren Danes- ENT  Dr. Norm Salt- ortho    Diabetes- diagnosed 3 years ago and her Hgb A1c was 7.0%. states she had prediabetes up until.  Started on Metformin 500 mg ER and doing well on this. She did not tolerate metformin IR Checks BS at home  Denies having complications from diabetes in the past. Has never been on insulin   Patient is checking home blood sugars.   Home blood sugar records: BGs range between 80-120 and 120-130 How often is blood sugars being checked: twice a day current symptoms include: none. Patient denies increased appetite, nausea, visual disturbances, vomiting and weight loss.  Patient is checking their feet daily. Any Foot concerns (callous, ulcer, wound, thickened nails, toenail fungus, skin fungus, hammer toe): none Last dilated eye exam: over a year  Current treatments: doing well on meds. Medication compliance: good  Current diet: states she has been eating a lot of fruit, especially grapes and blueberries. States her diet could be healthier.  Current exercise: walking daily for one hour  Known diabetic complications: none  The following portions of the patient's history were reviewed and updated as appropriate: allergies, current medications, past medical history, past social history and problem list.  ROS as in subjective above.     Objective:    Physical Exam Alert and in no distress otherwise not examined.  Blood pressure 110/60, pulse (!) 53, weight 219 lb 3.2 oz (99.4 kg).  Lab Review Diabetic Labs Latest Ref Rng & Units 11/26/2019 04/26/2019 09/25/2018 05/01/2018 03/05/2016  HbA1c 4.0 - 5.6 % 7.5(A) 6.7(A) - - -  Chol 100 - 199 mg/dL - 166 - - -  HDL >39 mg/dL -  88 - - -  Calc LDL 0 - 99 mg/dL - 68 - - -  Triglycerides 0 - 149 mg/dL - 50 - - -  Creatinine 0.57 - 1.00 mg/dL - 1.12(H) 1.09 1.11 1.12(H)  GFR >60.00 mL/min - - - 62.36 -   BP/Weight 11/26/2019 04/26/2019 03/27/2018 05/21/2017 1/88/4166  Systolic BP 063 016 010 932 355  Diastolic BP 60 84 64 90 84  Wt. (Lbs) 219.2 211.8 205.4 189 191.38  BMI 37.63 36.36 35.26 31.45 31.85   No flowsheet data found.  Gabriela Lowe  reports that she has never smoked. She has never used smokeless tobacco. She reports that she does not drink alcohol and does not use drugs.     Assessment & Plan:    Controlled type 2 diabetes mellitus without complication, without long-term current use of insulin ([place]) - Plan: HgB A1c, CBC with Differential/Platelet, Comprehensive metabolic panel  Essential hypertension - Plan: CBC with Differential/Platelet, Comprehensive metabolic panel  Needs flu shot - Plan: Flu Vaccine QUAD 36+ mos IM  1. Rx changes: none Hgb A1C 7.5% discussed increasing her medication but she prefers to try improving her diet.  2. Education: Reviewed 'ABCs' of diabetes management (respective goals in parentheses):  A1C (<7), blood pressure (<130/80), and cholesterol (LDL <100). 3. Compliance at present is estimated to be good. Efforts to improve compliance (if necessary) will be directed at dietary modifications: cut back on sugar including fruit and carbohydrates. continue getting regular exercise and keep an eye on blood  sugars  4. HTN- BP in goal range.  5. Follow up: 4 months for fasting CPE and diabetes

## 2019-11-26 ENCOUNTER — Ambulatory Visit (INDEPENDENT_AMBULATORY_CARE_PROVIDER_SITE_OTHER): Payer: BC Managed Care – PPO | Admitting: Family Medicine

## 2019-11-26 ENCOUNTER — Other Ambulatory Visit: Payer: Self-pay

## 2019-11-26 ENCOUNTER — Encounter: Payer: Self-pay | Admitting: Family Medicine

## 2019-11-26 VITALS — BP 110/60 | HR 53 | Wt 219.2 lb

## 2019-11-26 DIAGNOSIS — E119 Type 2 diabetes mellitus without complications: Secondary | ICD-10-CM

## 2019-11-26 DIAGNOSIS — I10 Essential (primary) hypertension: Secondary | ICD-10-CM

## 2019-11-26 DIAGNOSIS — Z23 Encounter for immunization: Secondary | ICD-10-CM | POA: Diagnosis not present

## 2019-11-26 LAB — POCT GLYCOSYLATED HEMOGLOBIN (HGB A1C): Hemoglobin A1C: 7.5 % — AB (ref 4.0–5.6)

## 2019-11-27 LAB — CBC WITH DIFFERENTIAL/PLATELET
Basophils Absolute: 0 10*3/uL (ref 0.0–0.2)
Basos: 1 %
EOS (ABSOLUTE): 0.2 10*3/uL (ref 0.0–0.4)
Eos: 3 %
Hematocrit: 40.2 % (ref 34.0–46.6)
Hemoglobin: 13.3 g/dL (ref 11.1–15.9)
Immature Grans (Abs): 0 10*3/uL (ref 0.0–0.1)
Immature Granulocytes: 0 %
Lymphocytes Absolute: 2.8 10*3/uL (ref 0.7–3.1)
Lymphs: 44 %
MCH: 27.4 pg (ref 26.6–33.0)
MCHC: 33.1 g/dL (ref 31.5–35.7)
MCV: 83 fL (ref 79–97)
Monocytes Absolute: 0.3 10*3/uL (ref 0.1–0.9)
Monocytes: 5 %
Neutrophils Absolute: 3.1 10*3/uL (ref 1.4–7.0)
Neutrophils: 47 %
Platelets: 249 10*3/uL (ref 150–450)
RBC: 4.86 x10E6/uL (ref 3.77–5.28)
RDW: 13.2 % (ref 11.7–15.4)
WBC: 6.4 10*3/uL (ref 3.4–10.8)

## 2019-11-27 LAB — COMPREHENSIVE METABOLIC PANEL
ALT: 18 IU/L (ref 0–32)
AST: 26 IU/L (ref 0–40)
Albumin/Globulin Ratio: 1.5 (ref 1.2–2.2)
Albumin: 4.4 g/dL (ref 3.8–4.9)
Alkaline Phosphatase: 101 IU/L (ref 44–121)
BUN/Creatinine Ratio: 13 (ref 9–23)
BUN: 18 mg/dL (ref 6–24)
Bilirubin Total: 0.4 mg/dL (ref 0.0–1.2)
CO2: 26 mmol/L (ref 20–29)
Calcium: 9.7 mg/dL (ref 8.7–10.2)
Chloride: 102 mmol/L (ref 96–106)
Creatinine, Ser: 1.39 mg/dL — ABNORMAL HIGH (ref 0.57–1.00)
GFR calc Af Amer: 50 mL/min/{1.73_m2} — ABNORMAL LOW (ref >59)
GFR calc non Af Amer: 43 mL/min/{1.73_m2} — ABNORMAL LOW (ref >59)
Globulin, Total: 2.9 g/dL (ref 1.5–4.5)
Glucose: 91 mg/dL (ref 65–99)
Potassium: 4.3 mmol/L (ref 3.5–5.2)
Sodium: 142 mmol/L (ref 134–144)
Total Protein: 7.3 g/dL (ref 6.0–8.5)

## 2019-11-28 ENCOUNTER — Encounter: Payer: Self-pay | Admitting: Family Medicine

## 2019-11-28 DIAGNOSIS — N189 Chronic kidney disease, unspecified: Secondary | ICD-10-CM | POA: Insufficient documentation

## 2019-11-28 DIAGNOSIS — E119 Type 2 diabetes mellitus without complications: Secondary | ICD-10-CM | POA: Insufficient documentation

## 2019-11-28 NOTE — Progress Notes (Signed)
I would like to start her on Farxiga if she is willing to help protect her kidneys. If she is willing and no history of thyroid cancer, then please send in this prescription to her pharmacy with the 30 day coupon and whatever coupon we have for her. If she starts on this medication then she needs an office visit in 6 weeks.

## 2019-11-29 NOTE — Progress Notes (Signed)
She prefers to avoid this medication. We will hold off for now and discuss any additional medication at her follow up visit.

## 2019-12-26 ENCOUNTER — Other Ambulatory Visit: Payer: Self-pay | Admitting: Family Medicine

## 2020-01-04 ENCOUNTER — Encounter: Payer: Self-pay | Admitting: Family Medicine

## 2020-02-01 ENCOUNTER — Encounter: Payer: Self-pay | Admitting: Medical

## 2020-02-01 ENCOUNTER — Telehealth: Payer: Self-pay | Admitting: Radiology

## 2020-02-01 ENCOUNTER — Other Ambulatory Visit: Payer: Self-pay

## 2020-02-01 ENCOUNTER — Ambulatory Visit: Payer: BC Managed Care – PPO | Admitting: Medical

## 2020-02-01 VITALS — BP 134/92 | HR 57 | Ht 64.0 in | Wt 213.6 lb

## 2020-02-01 DIAGNOSIS — R002 Palpitations: Secondary | ICD-10-CM

## 2020-02-01 DIAGNOSIS — I739 Peripheral vascular disease, unspecified: Secondary | ICD-10-CM

## 2020-02-01 DIAGNOSIS — E785 Hyperlipidemia, unspecified: Secondary | ICD-10-CM

## 2020-02-01 DIAGNOSIS — I10 Essential (primary) hypertension: Secondary | ICD-10-CM | POA: Diagnosis not present

## 2020-02-01 DIAGNOSIS — E119 Type 2 diabetes mellitus without complications: Secondary | ICD-10-CM

## 2020-02-01 NOTE — Patient Instructions (Addendum)
Medication Instructions:  Your physician recommends that you continue on your current medications as directed. Please refer to the Current Medication list given to you today.  *If you need a refill on your cardiac medications before your next appointment, please call your pharmacy*  Lab Work: NONE ordered at this time of appointment   If you have labs (blood work) drawn today and your tests are completely normal, you will receive your results only by: Marland Kitchen MyChart Message (if you have MyChart) OR . A paper copy in the mail If you have any lab test that is abnormal or we need to change your treatment, we will call you to review the results.  Testing/Procedures: Your physician has requested that you have a lower extremity arterial exercise duplex. During this test, exercise and ultrasound are used to evaluate arterial blood flow in the legs. Allow one hour for this exam. There are no restrictions or special instructions.  Your physician has requested that you have an ankle brachial index (ABI). During this test an ultrasound and blood pressure cuff are used to evaluate the arteries that supply the arms and legs with blood. Allow thirty minutes for this exam. There are no restrictions or special instructions.   ZIO XT- Long Term Monitor Instructions   Your physician has requested you wear your ZIO patch monitor 14 days.   This is a single patch monitor.  [employer] supplies one patch monitor per enrollment.  Additional stickers are not available.   Please do not apply patch if you will be having a Nuclear Stress Test, Echocardiogram, Cardiac CT, MRI, or Chest Xray during the time frame you would be wearing the monitor. The patch cannot be worn during these tests.  You cannot remove and re-apply the ZIO XT patch monitor.   Your ZIO patch monitor will be sent USPS Priority mail from [facility] directly to your home address. The monitor may also be mailed to a PO BOX if home delivery is not  available.   It may take 3-5 days to receive your monitor after you have been enrolled.   Once you have received you monitor, please review enclosed instructions.  Your monitor has already been registered assigning a specific monitor serial # to you.   Applying the monitor   Shave hair from upper left chest.   Hold abrader disc by orange tab.  Rub abrader in 40 strokes over left upper chest as indicated in your monitor instructions.   Clean area with 4 enclosed alcohol pads .  Use all pads to assure are is cleaned thoroughly.  Let dry.   Apply patch as indicated in monitor instructions.  Patch will be place under collarbone on left side of chest with arrow pointing upward.   Rub patch adhesive wings for 2 minutes.Remove white label marked "1".  Remove white label marked "2".  Rub patch adhesive wings for 2 additional minutes.   While looking in a mirror, press and release button in center of patch.  A small green light will flash 3-4 times .  This will be your only indicator the monitor has been turned on.     Do not shower for the first 24 hours.  You may shower after the first 24 hours.   Press button if you feel a symptom. You will hear a small click.  Record Date, Time and Symptom in the Patient Log Book.   When you are ready to remove patch, follow instructions on last 2 pages of Patient  Log Book.  Stick patch monitor onto last page of Patient Log Book.   Place Patient Log Book in [place] box.  Use locking tab on box and tape box closed securely.  The [employer] has [employer] on it.  Please place in mailbox as soon as possible.  Your physician should have your test results approximately 7 days after the monitor has been mailed back to [facility].   Call [place] at [phone] if you have questions regarding your ZIO XT patch monitor.  Call them immediately if you see an orange light blinking on your monitor.   If your monitor falls off in less  than 4 days contact our Monitor department at [phone].  If your monitor becomes loose or falls off after 4 days call [employer] at [phone] for suggestions on securing your monitor.    Follow-Up: At [facility], you and your health needs are our priority.  As part of our continuing mission to provide you with exceptional heart care, we have created designated Provider Care Teams.  These Care Teams include your primary Cardiologist (physician) and Advanced Practice Providers (APPs -  Physician Assistants and Nurse Practitioners) who all work together to provide you with the care you need, when you need it.  Your next appointment:   6 month(s)  The format for your next appointment:   In Person  Provider:   Sanda Klein, MD  Other Instructions        Low-Sodium Eating Plan Sodium, which is an element that makes up salt, helps you maintain a healthy balance of fluids in your body. Too much sodium can increase your blood pressure and cause fluid and waste to be held in your body. Your health care provider or dietitian may recommend following this plan if you have high blood pressure (hypertension), kidney disease, liver disease, or heart failure. Eating less sodium can help lower your blood pressure, reduce swelling, and protect your heart, liver, and kidneys. What are tips for following this plan? General guidelines  Most people on this plan should limit their sodium intake to 1,500-2,000 mg (milligrams) of sodium each day. Reading food labels   The Nutrition Facts label lists the amount of sodium in one serving of the food. If you eat more than one serving, you must multiply the listed amount of sodium by the number of servings.  Choose foods with less than 140 mg of sodium per serving.  Avoid foods with 300 mg of sodium or more per serving. Shopping  Look for lower-sodium products, often labeled as "low-sodium" or "no salt added."  Always check the sodium content  even if foods are labeled as "unsalted" or "no salt added".  Buy fresh foods. ? Avoid canned foods and premade or frozen meals. ? Avoid canned, cured, or processed meats  Buy breads that have less than 80 mg of sodium per slice. Cooking  Eat more home-cooked food and less restaurant, buffet, and fast food.  Avoid adding salt when cooking. Use salt-free seasonings or herbs instead of table salt or sea salt. Check with your health care provider or pharmacist before using salt substitutes.  Cook with plant-based oils, such as canola, sunflower, or olive oil. Meal planning  When eating at a restaurant, ask that your food be prepared with less salt or no salt, if possible.  Avoid foods that contain MSG (monosodium glutamate). MSG is sometimes added to [place] food, bouillon, and some canned foods. What foods are recommended? The  items listed may not be a complete list. Talk with your dietitian about what dietary choices are best for you. Grains Low-sodium cereals, including oats, puffed wheat and rice, and shredded wheat. Low-sodium crackers. Unsalted rice. Unsalted pasta. Low-sodium bread. Whole-grain breads and whole-grain pasta. Vegetables Fresh or frozen vegetables. "No salt added" canned vegetables. "No salt added" tomato sauce and paste. Low-sodium or reduced-sodium tomato and vegetable juice. Fruits Fresh, frozen, or canned fruit. Fruit juice. Meats and other protein foods Fresh or frozen (no salt added) meat, poultry, seafood, and fish. Low-sodium canned tuna and salmon. Unsalted nuts. Dried peas, beans, and lentils without added salt. Unsalted canned beans. Eggs. Unsalted nut butters. Dairy Milk. Soy milk. Cheese that is naturally low in sodium, such as ricotta cheese, fresh mozzarella, or Swiss cheese Low-sodium or reduced-sodium cheese. Cream cheese. Yogurt. Fats and oils Unsalted butter. Unsalted margarine with no trans fat. Vegetable oils such as canola or olive  oils. Seasonings and other foods Fresh and dried herbs and spices. Salt-free seasonings. Low-sodium mustard and ketchup. Sodium-free salad dressing. Sodium-free light mayonnaise. Fresh or refrigerated horseradish. Lemon juice. Vinegar. Homemade, reduced-sodium, or low-sodium soups. Unsalted popcorn and pretzels. Low-salt or salt-free chips. What foods are not recommended? The items listed may not be a complete list. Talk with your dietitian about what dietary choices are best for you. Grains Instant hot cereals. Bread stuffing, pancake, and biscuit mixes. Croutons. Seasoned rice or pasta mixes. Noodle soup cups. Boxed or frozen macaroni and cheese. Regular salted crackers. Self-rising flour. Vegetables Sauerkraut, pickled vegetables, and relishes. Olives. [place] fries. Onion rings. Regular canned vegetables (not low-sodium or reduced-sodium). Regular canned tomato sauce and paste (not low-sodium or reduced-sodium). Regular tomato and vegetable juice (not low-sodium or reduced-sodium). Frozen vegetables in sauces. Meats and other protein foods Meat or fish that is salted, canned, smoked, spiced, or pickled. Bacon, ham, sausage, hotdogs, corned beef, chipped beef, packaged lunch meats, salt pork, jerky, pickled herring, anchovies, regular canned tuna, sardines, salted nuts. Dairy Processed cheese and cheese spreads. Cheese curds. Blue cheese. Feta cheese. String cheese. Regular cottage cheese. Buttermilk. Canned milk. Fats and oils Salted butter. Regular margarine. Ghee. Bacon fat. Seasonings and other foods Onion salt, garlic salt, seasoned salt, table salt, and sea salt. Canned and packaged gravies. Worcestershire sauce. Tartar sauce. Barbecue sauce. Teriyaki sauce. Soy sauce, including reduced-sodium. Steak sauce. Fish sauce. Oyster sauce. Cocktail sauce. Horseradish that you find on the shelf. Regular ketchup and mustard. Meat flavorings and tenderizers. Bouillon cubes. Hot sauce and Tabasco sauce.  Premade or packaged marinades. Premade or packaged taco seasonings. Relishes. Regular salad dressings. Salsa. Potato and tortilla chips. Corn chips and puffs. Salted popcorn and pretzels. Canned or dried soups. Pizza. Frozen entrees and pot pies. Summary  Eating less sodium can help lower your blood pressure, reduce swelling, and protect your heart, liver, and kidneys.  Most people on this plan should limit their sodium intake to 1,500-2,000 mg (milligrams) of sodium each day.  Canned, boxed, and frozen foods are high in sodium. Restaurant foods, fast foods, and pizza are also very high in sodium. You also get sodium by adding salt to food.  Try to cook at home, eat more fresh fruits and vegetables, and eat less fast food, canned, processed, or prepared foods. This information is not intended to replace advice given to you by your health care provider. Make sure you discuss any questions you have with your health care provider. Document Revised: 01/17/2017 Document Reviewed: 01/29/2016 Elsevier Patient Education  [phone]  [employer].

## 2020-02-01 NOTE — Progress Notes (Signed)
Cardiology Office Note   Date:  02/03/2020   ID:  Vella, Colquitt 12/29/1965, MRN [id number]  PCP:  Girtha Rm, NP-C  Cardiologist:  Sanda Klein, MD EP: None  Chief Complaint  Patient presents with   Palpitations        Leg Pain      History of Present Illness: Gabriela Lowe is a 54 y.o. female with a PMH of HTN, HLD, pericarditis, DM type 2, GERD, who presents for palpitations.   She was last evaluated by cardiology at an outpatient visit with Dr. Sallyanne Kuster 03/2017 in follow-up of her recent pericarditis. She continued to have some chest pain with bending over which was felt to be MSK in etiology. She was recommended to follow-up as needed going forward. Her last echocardiogram in 2018 showed EF 60-65%, mild focal basal hypertrophy of the septum, normal LV diastolic function, moderate LAE, and mild TR.   She presents today with complaints of palpitations and LE tingling/pain. She reports for the past year she has been experiencing palpitations which she perceives in her neck. These last for seconds before resolving spontaneously. These palpitations are often brought on by movement of her head but can occasionally occur without rhyme or reason. There is no associated chest pain, fluttering in her chest, SOB, dizziness, lightheadedness, or syncope. She has had prior neck surgeries and I wonder if she's feeling spasms in her neck, however she is worried about them and would like further testing to rule out an abnormal heart rhythm. Additionally she has been experiencing tingling and restlessness in her legs. She does report some claudication, though no LE edema, rubor on dependency, or cold extremities. She does have a history of DM type 2 with A1C's generally less than 7, however was up to 7.5 on last check 11/2019. Likely her leg symptoms are related to diabetic neuropathy, however will check LEAs/ABI's to r/o arterial disease given history of HTN, HLD, and  DM type 2. She otherwise has occasional pulling sensation in her lower bilateral chest when lifting heavy objects but no chest pressure or heaviness with activity. She has some DOE which is unchanged in recent months. No complaints to LE edema, orthopnea, or PND. She reports blood pressures are typically in the 120s/70s-80s at home on regular checks. She is working to correct her diet and has been increasing her activity to hopefully lower her A1C back to goal.     Past Medical History:  Diagnosis Date   Arthritis    neck & back    Bronchitis    Chronic kidney disease (CKD)    Diabetes mellitus without complication (HCC)    External hemorrhoid    GERD (gastroesophageal reflux disease)    Headache    Hiatal hernia    Hypertension    Iron deficiency anemia    taking Fe TID, last hgb. 11.2, per pt.    Numbness and tingling    Schatzki's ring     Past Surgical History:  Procedure Laterality Date   BREAST EXCISIONAL BIOPSY Right 2017   benign   BREAST LUMPECTOMY WITH RADIOACTIVE SEED LOCALIZATION Right 09/11/2015   Procedure: RIGHT BREAST LUMPECTOMY WITH RADIOACTIVE SEED LOCALIZATION;  Surgeon: Coralie Keens, MD;  Location: [facility];  Service: General;  Laterality: Right;   CESAREAN SECTION     x 2   CHOLECYSTECTOMY     ROTATOR CUFF REPAIR Right    TUBAL LIGATION       Current Outpatient Medications  Medication Sig Dispense Refill   atorvastatin (LIPITOR) 20 MG tablet Take 20 mg by mouth daily.     DENTA 5000 PLUS 1.1 % CREA dental cream Take by mouth as directed.     ferrous sulfate 325 (65 FE) MG tablet Take 325 mg by mouth 3 (three) times daily.     fluticasone (FLONASE) 50 MCG/ACT nasal spray Place 2 sprays into both nostrils daily.     gabapentin (NEURONTIN) 300 MG capsule Take 300 mg by mouth daily.     lisinopril-hydrochlorothiazide (ZESTORETIC) 20-12.5 MG tablet TAKE 1 TABLET BY MOUTH DAILY AFTER LUNCH. 30 tablet 5   metFORMIN (GLUCOPHAGE-XR) 500  MG 24 hr tablet TAKE 1 TABLET (500 MG TOTAL) BY MOUTH AT BEDTIME. 30 tablet 5   pantoprazole (PROTONIX) 40 MG tablet Take 40 mg by mouth daily.     VITAMIN D, CHOLECALCIFEROL, PO Take by mouth.     Vitamins/Minerals TABS Take by mouth.     No current facility-administered medications for this visit.    Allergies:   Tramadol    Social History:  The patient  reports that she has never smoked. She has never used smokeless tobacco. She reports that she does not drink alcohol and does not use drugs.   Family History:  The patient's family history includes Congestive Heart Failure in her father; Diabetes in her brother, father, and mother; Heart attack (age of onset: 61) in her father; Hypertension in her mother.    ROS:  Please see the history of present illness.   Otherwise, review of systems are positive for none.   All other systems are reviewed and negative.    PHYSICAL EXAM: VS:  BP (!) 134/92    Pulse (!) 57    Ht 5\' 4"  (1.626 m)    Wt 213 lb 9.6 oz (96.9 kg)    BMI 36.66 kg/m  , BMI Body mass index is 36.66 kg/m. GEN: Well nourished, well developed, in no acute distress HEENT: sclera anicteric Neck: no JVD, carotid bruits, or masses Cardiac: RRR; no murmurs, rubs, or gallops, no edema  Respiratory:  clear to auscultation bilaterally, normal work of breathing GI: soft, nontender, nondistended, + BS MS: no deformity or atrophy Skin: warm and dry, no rash Neuro:  Strength and sensation are intact Psych: euthymic mood, full affect   EKG:  EKG is ordered today. The ekg ordered today demonstrates sinus bradycardia with rate 57 bpm, no TWI, no STE/D, no significant change from previous.    Recent Labs: 11/26/2019: ALT 18; BUN 18; Creatinine, Ser 1.39; Hemoglobin 13.3; Platelets 249; Potassium 4.3; Sodium 142    Lipid Panel    Component Value Date/Time   CHOL 166 04/26/2019 1535   TRIG 50 04/26/2019 1535   HDL 88 04/26/2019 1535   CHOLHDL 1.9 04/26/2019 1535   LDLCALC 68  04/26/2019 1535      Wt Readings from Last 3 Encounters:  02/01/20 213 lb 9.6 oz (96.9 kg)  11/26/19 219 lb 3.2 oz (99.4 kg)  04/26/19 211 lb 12.8 oz (96.1 kg)      Other studies Reviewed: Additional studies/ records that were reviewed today include:   Echocardiogram 2018: - Left ventricle: The cavity size was normal. There was mild focal  basal hypertrophy of the septum. Systolic function was normal.  The estimated ejection fraction was in the range of 60% to 65%.  Wall motion was normal; there were no regional wall motion  abnormalities. Left ventricular diastolic function parameters  were normal.  -  Aortic valve: Transvalvular velocity was within the normal range.  There was no stenosis. There was no regurgitation.  - Mitral valve: Transvalvular velocity was within the normal range.  There was no evidence for stenosis. There was trivial  regurgitation.  - Left atrium: The atrium was moderately dilated.  - Right ventricle: The cavity size was normal. Wall thickness was  normal. Systolic function was normal.  - Tricuspid valve: There was mild regurgitation.  - Pulmonary arteries: Systolic pressure was within the normal  range. PA peak pressure: 36 mm Hg (S).     ASSESSMENT AND PLAN:  1. Palpitations: patient reports intermittent palpitations which she perceives in her neck. Suspicions high for MSK etiology as they often occur with position changes of her neck and last for seconds at a time. She explains that these have been worrisome to her and she would like further testing. She did have moderate LAE on her last echo in 2018.  - Will check a 2 week zio patch monitor to r/o arrhythmias  2. LE tingling/pain: she does report some claudication but no LE edema, rubor on dependency, or cold extremities. She does have risk factors for PAD including HTN, HLD, and DM type 2 - Will check LEAs/ABI's to r/o arterial disease - Continue statin  3. HTN: BP 134/92  today; typically in the 120s/70-80s at home on regular checks - Will continue lisinopril-HCTZ for now  4. HLD: LDL 68 04/2019 - Continue atorvastatin  5. DM type 2: A1C 7.5 11/2019 - Continue metformin  6. CKD stage 3a: Cr 1.39 on last check 11/2019, was 1.1 04/2019.  - Low threshold to de-escalate HTN regimen if Cr trends upwards   Current medicines are reviewed at length with the patient today.  The patient does not have concerns regarding medicines.  The following changes have been made:  As above  Labs/ tests ordered today include:   Orders Placed This Encounter  Procedures   LONG TERM MONITOR (3-14 DAYS)   EKG 12-Lead   VAS [place] ABI WITH/WO TBI   VAS [place] LOWER EXTREMITY ARTERIAL DUPLEX     Disposition:   FU with Dr. Sallyanne Kuster in 6 months  Signed, Abigail Butts, PA-C  02/03/2020 4:24 PM

## 2020-02-01 NOTE — Telephone Encounter (Signed)
Enrolled patient for a 14 day Zio XT Monitor to be mailed to patients home  

## 2020-02-03 ENCOUNTER — Encounter: Payer: Self-pay | Admitting: Medical

## 2020-02-22 ENCOUNTER — Other Ambulatory Visit: Payer: Self-pay

## 2020-02-22 ENCOUNTER — Ambulatory Visit (HOSPITAL_COMMUNITY)
Admission: RE | Admit: 2020-02-22 | Discharge: 2020-02-22 | Disposition: A | Discharge status: Home / Self Care | Payer: BC Managed Care – PPO | Source: Ambulatory Visit | Attending: Cardiovascular Disease | Admitting: Cardiovascular Disease

## 2020-02-22 DIAGNOSIS — I739 Peripheral vascular disease, unspecified: Secondary | ICD-10-CM | POA: Diagnosis not present

## 2020-04-03 ENCOUNTER — Encounter: Payer: BC Managed Care – PPO | Admitting: Family Medicine

## 2020-04-20 ENCOUNTER — Ambulatory Visit (INDEPENDENT_AMBULATORY_CARE_PROVIDER_SITE_OTHER): Payer: BC Managed Care – PPO | Admitting: Family Medicine

## 2020-04-20 ENCOUNTER — Encounter: Payer: Self-pay | Admitting: Family Medicine

## 2020-04-20 ENCOUNTER — Other Ambulatory Visit: Payer: Self-pay

## 2020-04-20 VITALS — BP 130/80 | HR 64 | Temp 98.1°F | Ht 66.0 in | Wt 212.2 lb

## 2020-04-20 DIAGNOSIS — Z0000 Encounter for general adult medical examination without abnormal findings: Secondary | ICD-10-CM

## 2020-04-20 DIAGNOSIS — Z1211 Encounter for screening for malignant neoplasm of colon: Secondary | ICD-10-CM

## 2020-04-20 DIAGNOSIS — E1121 Type 2 diabetes mellitus with diabetic nephropathy: Secondary | ICD-10-CM

## 2020-04-20 DIAGNOSIS — E559 Vitamin D deficiency, unspecified: Secondary | ICD-10-CM

## 2020-04-20 DIAGNOSIS — R109 Unspecified abdominal pain: Secondary | ICD-10-CM

## 2020-04-20 DIAGNOSIS — I10 Essential (primary) hypertension: Secondary | ICD-10-CM | POA: Diagnosis not present

## 2020-04-20 DIAGNOSIS — E669 Obesity, unspecified: Secondary | ICD-10-CM

## 2020-04-20 DIAGNOSIS — Z1159 Encounter for screening for other viral diseases: Secondary | ICD-10-CM

## 2020-04-20 DIAGNOSIS — Z7185 Encounter for immunization safety counseling: Secondary | ICD-10-CM

## 2020-04-20 NOTE — Patient Instructions (Signed)
Please return for your Tdap and Pneumonia vaccines in about 4 weeks.   Return the stool cards at your convenience.   Continue taking good care of yourself.   We will be in touch with your results.     Preventive Care 55-55 Years Old, Female Preventive care refers to lifestyle choices and visits with your health care provider that can promote health and wellness. This includes:  A yearly physical exam. This is also called an annual wellness visit.  Regular dental and eye exams.  Immunizations.  Screening for certain conditions.  Healthy lifestyle choices, such as: ? Eating a healthy diet. ? Getting regular exercise. ? Not using drugs or products that contain nicotine and tobacco. ? Limiting alcohol use. What can I expect for my preventive care visit? Physical exam Your health care provider will check your:  Height and weight. These may be used to calculate your BMI (body mass index). BMI is a measurement that tells if you are at a healthy weight.  Heart rate and blood pressure.  Body temperature.  Skin for abnormal spots. Counseling Your health care provider may ask you questions about your:  Past medical problems.  Family's medical history.  Alcohol, tobacco, and drug use.  Emotional well-being.  Home life and relationship well-being.  Sexual activity.  Diet, exercise, and sleep habits.  Work and work Statistician.  Access to firearms.  Method of birth control.  Menstrual cycle.  Pregnancy history. What immunizations do I need? Vaccines are usually given at various ages, according to a schedule. Your health care provider will recommend vaccines for you based on your age, medical history, and lifestyle or other factors, such as travel or where you work.   What tests do I need? Blood tests  Lipid and cholesterol levels. These may be checked every 5 years, or more often if you are over 37 years old.  Hepatitis C test.  Hepatitis B  test. Screening  Lung cancer screening. You may have this screening every year starting at age 63 if you have a 30-pack-year history of smoking and currently smoke or have quit within the past 15 years.  Colorectal cancer screening. ? All adults should have this screening starting at age 22 and continuing until age 69. ? Your health care provider may recommend screening at age 47 if you are at increased risk. ? You will have tests every 1-10 years, depending on your results and the type of screening test.  Diabetes screening. ? This is done by checking your blood sugar (glucose) after you have not eaten for a while (fasting). ? You may have this done every 1-3 years.  Mammogram. ? This may be done every 1-2 years. ? Talk with your health care provider about when you should start having regular mammograms. This may depend on whether you have a family history of breast cancer.  BRCA-related cancer screening. This may be done if you have a family history of breast, ovarian, tubal, or peritoneal cancers.  Pelvic exam and Pap test. ? This may be done every 3 years starting at age 34. ? Starting at age 26, this may be done every 5 years if you have a Pap test in combination with an HPV test. Other tests  STD (sexually transmitted disease) testing, if you are at risk.  Bone density scan. This is done to screen for osteoporosis. You may have this scan if you are at high risk for osteoporosis. Talk with your health care provider about your test  results, treatment options, and if necessary, the need for more tests. Follow these instructions at home: Eating and drinking  Eat a diet that includes fresh fruits and vegetables, whole grains, lean protein, and low-fat dairy products.  Take vitamin and mineral supplements as recommended by your health care provider.  Do not drink alcohol if: ? Your health care provider tells you not to drink. ? You are pregnant, may be pregnant, or are planning  to become pregnant.  If you drink alcohol: ? Limit how much you have to 0-1 drink a day. ? Be aware of how much alcohol is in your drink. In the [place] one drink equals one 12 oz bottle of beer (355 mL), one 5 oz glass of wine (148 mL), or one 1 oz glass of hard liquor (44 mL).   Lifestyle  Take daily care of your teeth and gums. Brush your teeth every morning and night with fluoride toothpaste. Floss one time each day.  Stay active. Exercise for at least 30 minutes 5 or more days each week.  Do not use any products that contain nicotine or tobacco, such as cigarettes, e-cigarettes, and chewing tobacco. If you need help quitting, ask your health care provider.  Do not use drugs.  If you are sexually active, practice safe sex. Use a condom or other form of protection to prevent STIs (sexually transmitted infections).  If you do not wish to become pregnant, use a form of birth control. If you plan to become pregnant, see your health care provider for a prepregnancy visit.  If told by your health care provider, take low-dose aspirin daily starting at age 57.  Find healthy ways to cope with stress, such as: ? Meditation, yoga, or listening to music. ? Journaling. ? Talking to a trusted person. ? Spending time with friends and family. Safety  Always wear your seat belt while driving or riding in a vehicle.  Do not drive: ? If you have been drinking alcohol. Do not ride with someone who has been drinking. ? When you are tired or distracted. ? While texting.  Wear a helmet and other protective equipment during sports activities.  If you have firearms in your house, make sure you follow all gun safety procedures. What's next?  Visit your health care provider once a year for an annual wellness visit.  Ask your health care provider how often you should have your eyes and teeth checked.  Stay up to date on all vaccines. This information is not intended to replace advice given to you  by your health care provider. Make sure you discuss any questions you have with your health care provider. Document Revised: 11/09/2019 Document Reviewed: 10/16/2017 Elsevier Patient Education  2021 [employer].

## 2020-04-20 NOTE — Progress Notes (Signed)
Subjective:    Patient ID: Gabriela Lowe, female    DOB: 11/13/1965, 55 y.o.   MRN: [id number]  HPI Chief Complaint  Patient presents with  . Annual Exam    Cpe and diabetes no other issues   She is here for a complete physical exam and to follow up on chronic health conditions. Last CPE: at [facility]   Other providers: Cardiologist - Roby Lofts, PA  GI- Dr. Hilarie Fredrickson  OB/GYN- [place] of right side pain for the past 3-4 days. Intermittent sharp pain at times and cramping at times. She stretches and it relieves her pain. Occasionally the pain is in different locations. Thinks it may be gas? Pain lasts 3-4 minutes and resolves spontaneously.   Diabetes- Last Hgb A1c 7.5% on 11/26/2019 BS at home 88-90 and 2 hours after she eats dinner 125-128  Stopped bread, soda and uses air fryer. Eating much healthier.   GERD- not bothersome. Takes Protonix rarely now   HTN- doing well on medication   Social history: Lives alone, son in [place], works at [employer]  Denies smoking, drinking alcohol, drug use  Diet: very healthy  Excerise: 45 minutes 5 days   Immunizations: needs Covid booster, Tdap and pneumonia vaccines.   Health maintenance:  Mammogram: November 2021 Colonoscopy: 2015  Last Gynecological Exam: November 2021 Last Dental Exam: UTD  Last Eye Exam: [facility] on [address]   Wears seatbelt always,  smoke detectors in home and functioning, does not text while driving and feels safe in home environment.   Reviewed allergies, medications, past medical, surgical, family, and social history.   Review of Systems Review of Systems Constitutional: -fever, -chills, -sweats, -unexpected weight change,-fatigue ENT: -runny nose, -ear pain, -sore throat Cardiology:  -chest pain, -palpitations, -edema Respiratory: -cough, -shortness of breath, -wheezing Gastroenterology: -abdominal pain, -nausea, -vomiting, -diarrhea, -constipation  Hematology: -bleeding  or bruising problems Musculoskeletal: -arthralgias, -myalgias, -joint swelling, -back pain Ophthalmology: -vision changes Urology: -dysuria, -difficulty urinating, -hematuria, -urinary frequency, -urgency Neurology: -headache, -weakness, -tingling, -numbness       Objective:   Physical Exam BP 130/80   Pulse 64   Temp 98.1 F (36.7 C)   Ht 5\' 6"  (1.676 m)   Wt 212 lb 3.2 oz (96.3 kg)   BMI 34.25 kg/m   General Appearance:    Alert, cooperative, no distress, appears stated age  Head:    Normocephalic, without obvious abnormality, atraumatic  Eyes:    PERRL, conjunctiva/corneas clear, EOM's intact  Ears:    Normal TM's and external ear canals  Nose:  mask on   Throat:   Mask on   Neck:   Supple, no lymphadenopathy;  thyroid:  no   enlargement/tenderness/nodules; no JVD  Back:    Spine nontender, no curvature, ROM normal, no CVA     tenderness  Lungs:     Clear to auscultation bilaterally without wheezes, rales or     ronchi; respirations unlabored  Chest Wall:    No tenderness or deformity   Heart:    Regular rate and rhythm, S1 and S2 normal, no murmur, rub   or gallop  Breast Exam:    OB/GYN  Abdomen:     Soft, non-tender, nondistended, normoactive bowel sounds,    no masses, no hepatosplenomegaly  Genitalia:    OB/GYN      Extremities:   No clubbing, cyanosis or edema  Pulses:   2+ and symmetric all extremities  Skin:   Skin  color, texture, turgor normal, no rashes or lesions  Lymph nodes:   Cervical, supraclavicular, and axillary nodes normal  Neurologic:   CNII-XII intact, normal strength, sensation and gait; reflexes 2+ and symmetric throughout          Psych:   Normal mood, affect, hygiene and grooming.          Assessment & Plan:  Routine general medical examination at a health care facility - Plan: CBC with Differential/Platelet, Comprehensive metabolic panel, TSH, T4, free, T3, Lipid panel, Pfizer SARS-COV-2 Vaccine -Preventive health care reviewed.  Stool  cards sent since she is in between colonoscopy dates.  She is followed by her OB/GYN.  Counseling on healthy lifestyle including diet and exercise.  She appears to be taking good care of her self.  Recommend regular dental and eye exams.  Immunizations reviewed.  Discussed safety and health promotion.  Type 2 diabetes with nephropathy (HCC) - Plan: CBC with Differential/Platelet, Comprehensive metabolic panel, TSH, T4, free, T3, Hemoglobin A1c -Last hemoglobin A1c 7.5%.  She has reportedly improved her diet and increase her physical activity.  States she is now seen much lower blood sugar readings.  Reports doing well on her medication.  Recent eye exam and I will request the results.  I will check labs including her hemoglobin A1c and follow-up.  Primary hypertension - Plan: CBC with Differential/Platelet, Comprehensive metabolic panel -Well-controlled.  Continue medication.  Low-sodium diet recommended.  Check labs and follow-up  Vitamin D deficiency - Plan: VITAMIN D 25 Hydroxy (Vit-D Deficiency, Fractures) -Continue vitamin D supplement.  Check vitamin D level and follow-up  Need for hepatitis C screening test - Plan: Hepatitis C antibody -Done per screening guidelines  Immunization counseling -Covid booster given today.  She will return in 4 weeks for her Tdap and Pneumovax 23.  Obesity (BMI 30-39.9) - Plan: TSH, T3, Lipid panel -Is working on weight loss with healthy diet and exercise.  Screen for colon cancer -Stool cards provided.  Asymptomatic.  She will be due for colonoscopy recall in 2025.  Side pain -No red flag symptoms exam unremarkable.  Discussed that her symptoms are most likely associated with gas.

## 2020-04-21 LAB — CBC WITH DIFFERENTIAL/PLATELET
Basophils Absolute: 0.1 10*3/uL (ref 0.0–0.2)
Basos: 1 %
EOS (ABSOLUTE): 0.2 10*3/uL (ref 0.0–0.4)
Eos: 3 %
Hematocrit: 43 % (ref 34.0–46.6)
Hemoglobin: 14 g/dL (ref 11.1–15.9)
Immature Grans (Abs): 0 10*3/uL (ref 0.0–0.1)
Immature Granulocytes: 0 %
Lymphocytes Absolute: 2.9 10*3/uL (ref 0.7–3.1)
Lymphs: 45 %
MCH: 27.5 pg (ref 26.6–33.0)
MCHC: 32.6 g/dL (ref 31.5–35.7)
MCV: 85 fL (ref 79–97)
Monocytes Absolute: 0.3 10*3/uL (ref 0.1–0.9)
Monocytes: 5 %
Neutrophils Absolute: 2.9 10*3/uL (ref 1.4–7.0)
Neutrophils: 46 %
Platelets: 227 10*3/uL (ref 150–450)
RBC: 5.09 x10E6/uL (ref 3.77–5.28)
RDW: 13.4 % (ref 11.7–15.4)
WBC: 6.3 10*3/uL (ref 3.4–10.8)

## 2020-04-21 LAB — COMPREHENSIVE METABOLIC PANEL
ALT: 19 IU/L (ref 0–32)
AST: 28 IU/L (ref 0–40)
Albumin/Globulin Ratio: 1.5 (ref 1.2–2.2)
Albumin: 4.5 g/dL (ref 3.8–4.9)
Alkaline Phosphatase: 87 IU/L (ref 44–121)
BUN/Creatinine Ratio: 12 (ref 9–23)
BUN: 15 mg/dL (ref 6–24)
Bilirubin Total: 0.5 mg/dL (ref 0.0–1.2)
CO2: 24 mmol/L (ref 20–29)
Calcium: 9.7 mg/dL (ref 8.7–10.2)
Chloride: 103 mmol/L (ref 96–106)
Creatinine, Ser: 1.28 mg/dL — ABNORMAL HIGH (ref 0.57–1.00)
Globulin, Total: 3.1 g/dL (ref 1.5–4.5)
Glucose: 85 mg/dL (ref 65–99)
Potassium: 4.4 mmol/L (ref 3.5–5.2)
Sodium: 142 mmol/L (ref 134–144)
Total Protein: 7.6 g/dL (ref 6.0–8.5)
eGFR: 50 mL/min/{1.73_m2} — ABNORMAL LOW (ref >59)

## 2020-04-21 LAB — LIPID PANEL
Chol/HDL Ratio: 2.2 ratio (ref 0.0–4.4)
Cholesterol, Total: 184 mg/dL (ref 100–199)
HDL: 83 mg/dL (ref >39)
LDL Chol Calc (NIH): 91 mg/dL (ref 0–99)
Triglycerides: 49 mg/dL (ref 0–149)
VLDL Cholesterol Cal: 10 mg/dL (ref 5–40)

## 2020-04-21 LAB — T3: T3, Total: 113 ng/dL (ref 71–180)

## 2020-04-21 LAB — HEPATITIS C ANTIBODY: Hep C Virus Ab: <0.1 s/co ratio (ref 0.0–0.9)

## 2020-04-21 LAB — VITAMIN D 25 HYDROXY (VIT D DEFICIENCY, FRACTURES): Vit D, 25-Hydroxy: 33.8 ng/mL (ref 30.0–100.0)

## 2020-04-21 LAB — T4, FREE: Free T4: 1.25 ng/dL (ref 0.82–1.77)

## 2020-04-21 LAB — TSH: TSH: 0.684 u[IU]/mL (ref 0.450–4.500)

## 2020-04-21 LAB — HEMOGLOBIN A1C
Est. average glucose Bld gHb Est-mCnc: 146 mg/dL
Hgb A1c MFr Bld: 6.7 % — ABNORMAL HIGH (ref 4.8–5.6)

## 2020-05-02 ENCOUNTER — Telehealth: Payer: Self-pay | Admitting: Family Medicine

## 2020-05-02 NOTE — Telephone Encounter (Signed)
Received requested records from [facility] ob[facility]

## 2020-05-04 ENCOUNTER — Encounter: Payer: Self-pay | Admitting: Internal Medicine

## 2020-05-09 ENCOUNTER — Encounter: Payer: Self-pay | Admitting: Family Medicine

## 2020-05-18 DIAGNOSIS — M7552 Bursitis of left shoulder: Secondary | ICD-10-CM | POA: Diagnosis not present

## 2020-05-18 DIAGNOSIS — G8918 Other acute postprocedural pain: Secondary | ICD-10-CM | POA: Diagnosis not present

## 2020-05-18 DIAGNOSIS — M75102 Unspecified rotator cuff tear or rupture of left shoulder, not specified as traumatic: Secondary | ICD-10-CM | POA: Diagnosis not present

## 2020-05-18 DIAGNOSIS — M75122 Complete rotator cuff tear or rupture of left shoulder, not specified as traumatic: Secondary | ICD-10-CM | POA: Diagnosis not present

## 2020-05-19 ENCOUNTER — Other Ambulatory Visit (INDEPENDENT_AMBULATORY_CARE_PROVIDER_SITE_OTHER): Payer: BC Managed Care – PPO

## 2020-05-19 DIAGNOSIS — Z1211 Encounter for screening for malignant neoplasm of colon: Secondary | ICD-10-CM

## 2020-05-19 LAB — HEMOCCULT GUIAC POC 1CARD (OFFICE)
Card #2 Fecal Occult Blod, POC: NEGATIVE
Card #3 Fecal Occult Blood, POC: NEGATIVE
Fecal Occult Blood, POC: NEGATIVE

## 2020-05-29 ENCOUNTER — Encounter: Payer: Self-pay | Admitting: Internal Medicine

## 2020-05-31 DIAGNOSIS — Z9889 Other specified postprocedural states: Secondary | ICD-10-CM | POA: Diagnosis not present

## 2020-06-01 NOTE — Telephone Encounter (Signed)
Monitor was returned with no data/never worn to [employer]. Order will be cancelled

## 2020-06-21 DIAGNOSIS — Z9889 Other specified postprocedural states: Secondary | ICD-10-CM | POA: Diagnosis not present

## 2020-06-27 DIAGNOSIS — M75122 Complete rotator cuff tear or rupture of left shoulder, not specified as traumatic: Secondary | ICD-10-CM | POA: Diagnosis not present

## 2020-06-30 ENCOUNTER — Other Ambulatory Visit: Payer: Self-pay | Admitting: Family Medicine

## 2020-06-30 DIAGNOSIS — M75122 Complete rotator cuff tear or rupture of left shoulder, not specified as traumatic: Secondary | ICD-10-CM | POA: Diagnosis not present

## 2020-07-04 DIAGNOSIS — M75122 Complete rotator cuff tear or rupture of left shoulder, not specified as traumatic: Secondary | ICD-10-CM | POA: Diagnosis not present

## 2020-07-06 DIAGNOSIS — M75122 Complete rotator cuff tear or rupture of left shoulder, not specified as traumatic: Secondary | ICD-10-CM | POA: Diagnosis not present

## 2020-07-10 DIAGNOSIS — M75122 Complete rotator cuff tear or rupture of left shoulder, not specified as traumatic: Secondary | ICD-10-CM | POA: Diagnosis not present

## 2020-07-12 DIAGNOSIS — M75122 Complete rotator cuff tear or rupture of left shoulder, not specified as traumatic: Secondary | ICD-10-CM | POA: Diagnosis not present

## 2020-07-18 DIAGNOSIS — M75122 Complete rotator cuff tear or rupture of left shoulder, not specified as traumatic: Secondary | ICD-10-CM | POA: Diagnosis not present

## 2020-07-20 DIAGNOSIS — M75122 Complete rotator cuff tear or rupture of left shoulder, not specified as traumatic: Secondary | ICD-10-CM | POA: Diagnosis not present

## 2020-07-25 DIAGNOSIS — M75122 Complete rotator cuff tear or rupture of left shoulder, not specified as traumatic: Secondary | ICD-10-CM | POA: Diagnosis not present

## 2020-07-27 DIAGNOSIS — M75122 Complete rotator cuff tear or rupture of left shoulder, not specified as traumatic: Secondary | ICD-10-CM | POA: Diagnosis not present

## 2020-08-01 DIAGNOSIS — M75122 Complete rotator cuff tear or rupture of left shoulder, not specified as traumatic: Secondary | ICD-10-CM | POA: Diagnosis not present

## 2020-08-03 DIAGNOSIS — M75122 Complete rotator cuff tear or rupture of left shoulder, not specified as traumatic: Secondary | ICD-10-CM | POA: Diagnosis not present

## 2020-08-08 DIAGNOSIS — M75122 Complete rotator cuff tear or rupture of left shoulder, not specified as traumatic: Secondary | ICD-10-CM | POA: Diagnosis not present

## 2020-08-10 DIAGNOSIS — M75122 Complete rotator cuff tear or rupture of left shoulder, not specified as traumatic: Secondary | ICD-10-CM | POA: Diagnosis not present

## 2020-08-15 DIAGNOSIS — M75122 Complete rotator cuff tear or rupture of left shoulder, not specified as traumatic: Secondary | ICD-10-CM | POA: Diagnosis not present

## 2020-08-17 DIAGNOSIS — M75122 Complete rotator cuff tear or rupture of left shoulder, not specified as traumatic: Secondary | ICD-10-CM | POA: Diagnosis not present

## 2020-09-27 ENCOUNTER — Encounter: Payer: Self-pay | Admitting: Internal Medicine

## 2020-10-24 ENCOUNTER — Other Ambulatory Visit: Payer: Self-pay

## 2020-10-24 ENCOUNTER — Ambulatory Visit: Payer: BC Managed Care – PPO | Admitting: Family Medicine

## 2020-10-24 ENCOUNTER — Encounter: Payer: Self-pay | Admitting: Family Medicine

## 2020-10-24 VITALS — BP 130/82 | HR 68 | Temp 98.7°F | Wt 216.4 lb

## 2020-10-24 DIAGNOSIS — R109 Unspecified abdominal pain: Secondary | ICD-10-CM | POA: Diagnosis not present

## 2020-10-24 DIAGNOSIS — I10 Essential (primary) hypertension: Secondary | ICD-10-CM

## 2020-10-24 DIAGNOSIS — E559 Vitamin D deficiency, unspecified: Secondary | ICD-10-CM

## 2020-10-24 DIAGNOSIS — E1121 Type 2 diabetes mellitus with diabetic nephropathy: Secondary | ICD-10-CM | POA: Diagnosis not present

## 2020-10-24 DIAGNOSIS — Z23 Encounter for immunization: Secondary | ICD-10-CM

## 2020-10-24 NOTE — Progress Notes (Signed)
   Subjective:    Patient ID: Gabriela Lowe, female    DOB: Jun 22, 1965, 55 y.o.   MRN: [id number]  HPI Chief Complaint  Patient presents with   other    6 month f/u pain on rt. Side cramping in stomach, started about 2-3 months ago not all the time on and off. Gets white spots on skin when out in the sun and it is very hot wondering if it was from the Lisinopril.    She is here for a 6 month follow up on chronic health conditions.   Other providers: Cardiologist - Roby Lofts, PA  GI- Dr. Hilarie Fredrickson  OB/GYN- [facility]   Diabetes- Hgb A1c 6.7% in 04/2020  BS at home, FBS 75-80, 2 hours post prandial 120 Healthy diet Exercising   HTN- reports good compliance with medication   Taking statin   Vitamin D def- is not currently taking a daily supplement.   Bilateral side pain and upper abdominal pain that is intermittent and occurs 2 x per week lasting approximately 5-7 minutes each time and resolves with stretching. The pain is in different areas each time. States it often feels like trapped gas. No changes in bowel habits.  Denies fever, chills, dizziness, chest pain, palpitations, shortness of breath, N/V/D, urinary symptoms, LE edema.   Reviewed allergies, medications, past medical, surgical, family, and social history.   Review of Systems Pertinent positives and negatives in the history of present illness.     Objective:   Physical Exam Constitutional:      General: She is not in acute distress.    Appearance: Normal appearance. She is not ill-appearing.  Cardiovascular:     Rate and Rhythm: Normal rate.  Pulmonary:     Effort: Pulmonary effort is normal.  Abdominal:     General: Abdomen is flat.     Palpations: Abdomen is soft.  Neurological:     Mental Status: She is alert.   BP 130/82   Pulse 68   Temp 98.7 F (37.1 C)   Wt 216 lb 6.4 oz (98.2 kg)   BMI 34.93 kg/m       Assessment & Plan:  Type 2 diabetes with nephropathy (HCC) - Plan: CBC  with Differential/Platelet, Comprehensive metabolic panel, Hemoglobin A1c, TSH  Primary hypertension - Plan: CBC with Differential/Platelet, Comprehensive metabolic panel  Need for influenza vaccination - Plan: Flu Vaccine QUAD 6+ mos PF IM (Fluarix Quad PF)  Intermittent abdominal pain  Vitamin D deficiency - Plan: VITAMIN D 25 Hydroxy (Vit-D Deficiency, Fractures)  BP controlled and she will continue current medication.  Diabetes- taking metformin. Keeping an eye on BS at home. Counseling on diet and exercise. Follow up pending labs.  Continue statin therapy.  Abdominal pain appears to be from gas or potentially MSK and she will continue to keep an eye on her symptoms. Follow up as needed. May take probiotic.  Check vitamin D level and start back on supplement.  Flu vaccine given.

## 2020-10-25 LAB — CBC WITH DIFFERENTIAL/PLATELET
Basophils Absolute: 0.1 10*3/uL (ref 0.0–0.2)
Basos: 1 %
EOS (ABSOLUTE): 0.2 10*3/uL (ref 0.0–0.4)
Eos: 2 %
Hematocrit: 40.6 % (ref 34.0–46.6)
Hemoglobin: 13.6 g/dL (ref 11.1–15.9)
Immature Grans (Abs): 0 10*3/uL (ref 0.0–0.1)
Immature Granulocytes: 0 %
Lymphocytes Absolute: 2.6 10*3/uL (ref 0.7–3.1)
Lymphs: 40 %
MCH: 27.6 pg (ref 26.6–33.0)
MCHC: 33.5 g/dL (ref 31.5–35.7)
MCV: 83 fL (ref 79–97)
Monocytes Absolute: 0.4 10*3/uL (ref 0.1–0.9)
Monocytes: 6 %
Neutrophils Absolute: 3.3 10*3/uL (ref 1.4–7.0)
Neutrophils: 51 %
Platelets: 230 10*3/uL (ref 150–450)
RBC: 4.92 x10E6/uL (ref 3.77–5.28)
RDW: 12.5 % (ref 11.7–15.4)
WBC: 6.5 10*3/uL (ref 3.4–10.8)

## 2020-10-25 LAB — COMPREHENSIVE METABOLIC PANEL
ALT: 17 IU/L (ref 0–32)
AST: 23 IU/L (ref 0–40)
Albumin/Globulin Ratio: 1.7 (ref 1.2–2.2)
Albumin: 4.5 g/dL (ref 3.8–4.9)
Alkaline Phosphatase: 88 IU/L (ref 44–121)
BUN/Creatinine Ratio: 14 (ref 9–23)
BUN: 18 mg/dL (ref 6–24)
Bilirubin Total: 0.4 mg/dL (ref 0.0–1.2)
CO2: 24 mmol/L (ref 20–29)
Calcium: 9.9 mg/dL (ref 8.7–10.2)
Chloride: 103 mmol/L (ref 96–106)
Creatinine, Ser: 1.25 mg/dL — ABNORMAL HIGH (ref 0.57–1.00)
Globulin, Total: 2.7 g/dL (ref 1.5–4.5)
Glucose: 93 mg/dL (ref 65–99)
Potassium: 4.5 mmol/L (ref 3.5–5.2)
Sodium: 141 mmol/L (ref 134–144)
Total Protein: 7.2 g/dL (ref 6.0–8.5)
eGFR: 51 mL/min/{1.73_m2} — ABNORMAL LOW (ref >59)

## 2020-10-25 LAB — TSH: TSH: 0.768 u[IU]/mL (ref 0.450–4.500)

## 2020-10-25 LAB — HEMOGLOBIN A1C
Est. average glucose Bld gHb Est-mCnc: 151 mg/dL
Hgb A1c MFr Bld: 6.9 % — ABNORMAL HIGH (ref 4.8–5.6)

## 2020-10-25 LAB — VITAMIN D 25 HYDROXY (VIT D DEFICIENCY, FRACTURES): Vit D, 25-Hydroxy: 37 ng/mL (ref 30.0–100.0)

## 2020-10-31 ENCOUNTER — Encounter: Payer: Self-pay | Admitting: Family Medicine

## 2020-11-08 ENCOUNTER — Telehealth: Payer: BC Managed Care – PPO | Admitting: Family Medicine

## 2020-11-09 ENCOUNTER — Other Ambulatory Visit: Payer: Self-pay

## 2020-11-09 ENCOUNTER — Encounter: Payer: Self-pay | Admitting: Family Medicine

## 2020-11-09 ENCOUNTER — Telehealth (INDEPENDENT_AMBULATORY_CARE_PROVIDER_SITE_OTHER): Payer: BC Managed Care – PPO | Admitting: Family Medicine

## 2020-11-09 VITALS — BP 122/76 | Wt 215.0 lb

## 2020-11-09 DIAGNOSIS — E1140 Type 2 diabetes mellitus with diabetic neuropathy, unspecified: Secondary | ICD-10-CM | POA: Diagnosis not present

## 2020-11-09 DIAGNOSIS — M797 Fibromyalgia: Secondary | ICD-10-CM

## 2020-11-09 MED ORDER — GABAPENTIN 100 MG PO CAPS: 100.0000 mg | ORAL_CAPSULE | Freq: Every day | ORAL | 2 refills | Status: DC

## 2020-11-09 NOTE — Progress Notes (Signed)
   Subjective:  Documentation for virtual audio and video telecommunications through [place] encounter:  The patient was located at home. 2 patient identifiers used.  The provider was located in the office. The patient did consent to this visit and is aware of possible charges through their insurance for this visit.  The other persons participating in this telemedicine service were none. Time spent on call was 16 minutes and in review of previous records 20 minutes total.  This virtual service is not related to other E/M service within previous 7 days.   Patient ID: Gabriela Lowe, female    DOB: [birth year], 55 y.o.   MRN: [id number]  HPI Chief Complaint  Patient presents with   Peripheral Neuropathy    Discuss refilling gabapentin   States she has been out of gabapentin for the past 2 months. States her previous PCP refilled this for her due to neuropathy in her feet. States she has burning and tingling in her toes. States her neurologist started her on this medication. States 300 mg makes her feel like a zombie, states 100 mg helps and she tolerated this dose well. States she only takes it at bedtime to help with her pain.  Denies alcohol use.   No other concerns today.   Reviewed allergies, medications, past medical, surgical, family, and social history.   Review of Systems Pertinent positives and negatives in the history of present illness.     Objective:   Physical Exam BP 122/76   Wt 215 lb (97.5 kg)   BMI 34.70 kg/m   Alert and oriented and in no acute distress.       Assessment & Plan:  Type 2 diabetes mellitus with diabetic neuropathy, without long-term current use of insulin (HCC) - Plan: gabapentin (NEURONTIN) 100 MG capsule  Fibromyalgia - Plan: gabapentin (NEURONTIN) 100 MG capsule  I will send in the prescription for gabapentin as requested and recommend follow up as needed. Diabetes visit done on 10/24/2020 and diabetes under control with A1c  6.9%.

## 2021-01-15 ENCOUNTER — Other Ambulatory Visit: Payer: Self-pay | Admitting: Physician Assistant

## 2021-01-15 DIAGNOSIS — Z1231 Encounter for screening mammogram for malignant neoplasm of breast: Secondary | ICD-10-CM

## 2021-01-16 ENCOUNTER — Ambulatory Visit: Payer: BC Managed Care – PPO

## 2021-02-05 ENCOUNTER — Encounter: Payer: Self-pay | Admitting: Family Medicine

## 2021-02-05 ENCOUNTER — Telehealth: Payer: BC Managed Care – PPO | Admitting: Family Medicine

## 2021-02-05 ENCOUNTER — Other Ambulatory Visit: Payer: Self-pay

## 2021-02-05 VITALS — Temp 98.1°F | Wt 211.0 lb

## 2021-02-05 DIAGNOSIS — J209 Acute bronchitis, unspecified: Secondary | ICD-10-CM

## 2021-02-05 MED ORDER — CLARITHROMYCIN 500 MG PO TABS: 500.0000 mg | ORAL_TABLET | Freq: Two times a day (BID) | ORAL | 0 refills | Status: DC

## 2021-02-05 NOTE — Progress Notes (Signed)
° °  Subjective:    Patient ID: Gabriela Lowe, female    DOB: Jun 18, 1965, 55 y.o.   MRN: [id number]  HPI Documentation for virtual audio and video telecommunications through [place] encounter: The patient was located at home. 2 patient identifiers used.  The provider was located in the office. The patient did consent to this visit and is aware of possible charges through their insurance for this visit. The other persons participating in this telemedicine service were none. Time spent on call was 5 minutes and in review of previous records >19 minutes total for counseling and coordination of care. This virtual service is not related to other E/M service within previous 7 days.  She states that 2 weeks ago she had URI symptoms of nasal congestion, rhinorrhea, slight cough but no fever or chills.  This has continued for the last 2 weeks and now she has developed a slightly hoarse voice, nonproductive cough, left ear discomfort but no fever, chills, sore throat.  She has no underlying allergies and does not smoke.  Review of Systems     Objective:   Physical Exam Alert and in no distress.  Voice is hoarse.       Assessment & Plan:  Acute bronchitis, unspecified organism - Plan: clarithromycin (BIAXIN) 500 MG tablet She is to take all the antibiotic if not totally back to normal when she finishes, call me also cautioned to take half of the statin while on the antibiotic.

## 2021-02-26 ENCOUNTER — Ambulatory Visit
Admission: RE | Admit: 2021-02-26 | Discharge: 2021-02-26 | Disposition: A | Discharge status: Home / Self Care | Payer: BC Managed Care – PPO | Source: Ambulatory Visit | Attending: Physician Assistant | Admitting: Physician Assistant

## 2021-02-26 DIAGNOSIS — Z1231 Encounter for screening mammogram for malignant neoplasm of breast: Secondary | ICD-10-CM

## 2021-03-01 ENCOUNTER — Other Ambulatory Visit: Payer: Self-pay

## 2021-03-01 MED ORDER — METFORMIN HCL ER 500 MG PO TB24: 500.0000 mg | ORAL_TABLET | Freq: Every day | ORAL | 5 refills | Status: DC

## 2021-03-01 MED ORDER — LISINOPRIL-HYDROCHLOROTHIAZIDE 20-12.5 MG PO TABS: 1.0000 | ORAL_TABLET | Freq: Every day | ORAL | 5 refills | Status: DC

## 2021-03-02 ENCOUNTER — Other Ambulatory Visit: Payer: Self-pay | Admitting: Family Medicine

## 2021-03-02 DIAGNOSIS — J209 Acute bronchitis, unspecified: Secondary | ICD-10-CM

## 2021-03-02 MED ORDER — CLARITHROMYCIN 500 MG PO TABS: 500.0000 mg | ORAL_TABLET | Freq: Two times a day (BID) | ORAL | 0 refills | Status: DC

## 2021-03-08 DIAGNOSIS — J3489 Other specified disorders of nose and nasal sinuses: Secondary | ICD-10-CM | POA: Diagnosis not present

## 2021-03-08 DIAGNOSIS — K219 Gastro-esophageal reflux disease without esophagitis: Secondary | ICD-10-CM | POA: Diagnosis not present

## 2021-03-13 ENCOUNTER — Telehealth: Payer: Self-pay | Admitting: Physician Assistant

## 2021-03-13 DIAGNOSIS — M797 Fibromyalgia: Secondary | ICD-10-CM

## 2021-03-13 DIAGNOSIS — E1140 Type 2 diabetes mellitus with diabetic neuropathy, unspecified: Secondary | ICD-10-CM

## 2021-03-13 MED ORDER — GABAPENTIN 100 MG PO CAPS: 100.0000 mg | ORAL_CAPSULE | Freq: Every day | ORAL | 2 refills | Status: DC

## 2021-03-13 NOTE — Telephone Encounter (Signed)
[facility] sent refill request for gabapentin please send to the [id number] - [place], [place] - [place]

## 2021-04-09 ENCOUNTER — Ambulatory Visit: Payer: BC Managed Care – PPO | Admitting: Physician Assistant

## 2021-04-23 ENCOUNTER — Ambulatory Visit: Payer: BC Managed Care – PPO | Admitting: Physician Assistant

## 2021-06-21 ENCOUNTER — Other Ambulatory Visit: Payer: Self-pay | Admitting: Family Medicine

## 2021-06-21 ENCOUNTER — Ambulatory Visit: Payer: BC Managed Care – PPO | Admitting: Physician Assistant

## 2021-06-21 DIAGNOSIS — E1140 Type 2 diabetes mellitus with diabetic neuropathy, unspecified: Secondary | ICD-10-CM

## 2021-06-21 DIAGNOSIS — M797 Fibromyalgia: Secondary | ICD-10-CM

## 2021-06-21 NOTE — Telephone Encounter (Signed)
Pt. Requesting refill on gabapentin next apt 07/10/21 ?

## 2021-07-10 ENCOUNTER — Ambulatory Visit: Payer: BC Managed Care – PPO | Admitting: Physician Assistant

## 2021-07-10 ENCOUNTER — Encounter: Payer: Self-pay | Admitting: Physician Assistant

## 2021-07-10 VITALS — BP 130/80 | HR 65 | Ht 66.0 in | Wt 210.0 lb

## 2021-07-10 DIAGNOSIS — E1165 Type 2 diabetes mellitus with hyperglycemia: Secondary | ICD-10-CM | POA: Insufficient documentation

## 2021-07-10 DIAGNOSIS — I10 Essential (primary) hypertension: Secondary | ICD-10-CM | POA: Insufficient documentation

## 2021-07-10 DIAGNOSIS — E782 Mixed hyperlipidemia: Secondary | ICD-10-CM | POA: Diagnosis not present

## 2021-07-10 DIAGNOSIS — R252 Cramp and spasm: Secondary | ICD-10-CM

## 2021-07-10 DIAGNOSIS — E6609 Other obesity due to excess calories: Secondary | ICD-10-CM

## 2021-07-10 DIAGNOSIS — K219 Gastro-esophageal reflux disease without esophagitis: Secondary | ICD-10-CM | POA: Diagnosis not present

## 2021-07-10 DIAGNOSIS — Z6833 Body mass index (BMI) 33.0-33.9, adult: Secondary | ICD-10-CM

## 2021-07-10 HISTORY — DX: Gastro-esophageal reflux disease without esophagitis: K21.9

## 2021-07-10 MED ORDER — PANTOPRAZOLE SODIUM 40 MG PO TBEC: 40.0000 mg | DELAYED_RELEASE_TABLET | Freq: Every day | ORAL | 2 refills | Status: DC

## 2021-07-10 MED ORDER — ATORVASTATIN CALCIUM 20 MG PO TABS: 20.0000 mg | ORAL_TABLET | Freq: Every day | ORAL | 2 refills | Status: DC

## 2021-07-10 MED ORDER — METFORMIN HCL ER 500 MG PO TB24
500.0000 mg | ORAL_TABLET | Freq: Every day | ORAL | 2 refills | Status: DC
Start: 2021-07-10 — End: 2022-08-02

## 2021-07-10 MED ORDER — LISINOPRIL-HYDROCHLOROTHIAZIDE 20-12.5 MG PO TABS: 1.0000 | ORAL_TABLET | Freq: Every day | ORAL | 2 refills | Status: DC

## 2021-07-10 NOTE — Progress Notes (Unsigned)
Established Patient Office Visit  Subjective:  Patient ID: Gabriela Lowe, female    DOB: December 28, 1965  Age: 56 y.o. MRN: [id number]  CC:  Chief Complaint  Patient presents with   Diabetes    Diabetes check- no other concerns    HPI Gabriela Lowe presents for diabetes follow up and thighs cramping a lot x 2 months; no recent change in job but stands about 7 1/2 hours a shift, drinks 50 ounces of water daily; has been walking 3 days a week to exercise and walks up and down hills   Outpatient Medications Prior to Visit  Medication Sig Dispense Refill   fluticasone (FLONASE) 50 MCG/ACT nasal spray Place 2 sprays into both nostrils daily.     gabapentin (NEURONTIN) 100 MG capsule TAKE 1 CAPSULE BY MOUTH AT BEDTIME. 30 capsule 2   atorvastatin (LIPITOR) 20 MG tablet Take 20 mg by mouth daily.     lisinopril-hydrochlorothiazide (ZESTORETIC) 20-12.5 MG tablet Take 1 tablet by mouth daily after lunch. 30 tablet 5   metFORMIN (GLUCOPHAGE-XR) 500 MG 24 hr tablet Take 1 tablet (500 mg total) by mouth at bedtime. 30 tablet 5   pantoprazole (PROTONIX) 40 MG tablet Take 40 mg by mouth daily.     clarithromycin (BIAXIN) 500 MG tablet Take 1 tablet (500 mg total) by mouth 2 (two) times daily. (Patient not taking: Reported on 07/10/2021) 20 tablet 0   Vitamins/Minerals TABS Take by mouth. (Patient not taking: Reported on 07/10/2021)     No facility-administered medications prior to visit.    Allergies  Allergen Reactions   Tramadol Nausea Only and Other (See Comments)    Pt states makes her groggy and nauseous    Patient Care Team: Marcellina Millin as PCP - General (Physician Assistant) Sanda Klein, MD as PCP - Cardiology (Cardiology) Shirl Harris, OD (Optometry)  ROS Review of Systems  Constitutional:  Negative for activity change and chills.  HENT:  Negative for congestion and voice change.   Eyes:  Negative for pain and redness.  Respiratory:  Negative for  cough and wheezing.   Cardiovascular:  Negative for chest pain.  Gastrointestinal:  Negative for constipation, diarrhea, nausea and vomiting.  Endocrine: Negative for polyuria.  Genitourinary:  Negative for frequency.  Skin:  Negative for color change and rash.  Allergic/Immunologic: Negative for immunocompromised state.  Neurological:  Negative for dizziness.  Psychiatric/Behavioral:  Negative for agitation.      Objective:    Physical Exam Vitals and nursing note reviewed.  Constitutional:      General: She is not in acute distress.    Appearance: She is normal weight. She is not ill-appearing.  HENT:     Head: Normocephalic and atraumatic.     Right Ear: External ear normal.     Left Ear: External ear normal.  Eyes:     Extraocular Movements: Extraocular movements intact.     Conjunctiva/sclera: Conjunctivae normal.     Pupils: Pupils are equal, round, and reactive to light.  Cardiovascular:     Rate and Rhythm: Normal rate and regular rhythm.  Pulmonary:     Effort: Pulmonary effort is normal.     Breath sounds: Normal breath sounds. No wheezing.  Abdominal:     General: Bowel sounds are normal.     Palpations: Abdomen is soft.  Musculoskeletal:        General: Normal range of motion.     Cervical back: Normal range of motion.  Skin:  General: Skin is warm and dry.  Neurological:     Mental Status: She is alert and oriented to person, place, and time.  Psychiatric:        Mood and Affect: Mood normal.    BP 130/80   Pulse 65   Ht 5' 6"  (1.676 m)   Wt 210 lb (95.3 kg)   LMP 12/12/2015   BMI 33.89 kg/m   Wt Readings from Last 3 Encounters:  07/10/21 210 lb (95.3 kg)  02/05/21 211 lb (95.7 kg)  11/09/20 215 lb (97.5 kg)    Results for orders placed or performed in visit on 07/10/21  Comprehensive metabolic panel  Result Value Ref Range   Glucose 92 70 - 99 mg/dL   BUN 15 6 - 24 mg/dL   Creatinine, Ser 1.33 (H) 0.57 - 1.00 mg/dL   eGFR 47 (L) >59  mL/min/1.73   BUN/Creatinine Ratio 11 9 - 23   Sodium 141 134 - 144 mmol/L   Potassium 4.8 3.5 - 5.2 mmol/L   Chloride 101 96 - 106 mmol/L   CO2 25 20 - 29 mmol/L   Calcium 9.9 8.7 - 10.2 mg/dL   Total Protein 7.4 6.0 - 8.5 g/dL   Albumin 4.4 3.8 - 4.9 g/dL   Globulin, Total 3.0 1.5 - 4.5 g/dL   Albumin/Globulin Ratio 1.5 1.2 - 2.2   Bilirubin Total 0.4 0.0 - 1.2 mg/dL   Alkaline Phosphatase 86 44 - 121 IU/L   AST 20 0 - 40 IU/L   ALT 13 0 - 32 IU/L  Lipid panel  Result Value Ref Range   Cholesterol, Total 188 100 - 199 mg/dL   Triglycerides 66 0 - 149 mg/dL   HDL 78 >39 mg/dL   VLDL Cholesterol Cal 12 5 - 40 mg/dL   LDL Chol Calc (NIH) 98 0 - 99 mg/dL   Chol/HDL Ratio 2.4 0.0 - 4.4 ratio  Hemoglobin A1c  Result Value Ref Range   Hgb A1c MFr Bld WILL FOLLOW    Est. average glucose Bld gHb Est-mCnc WILL FOLLOW      Last CBC Lab Results  Component Value Date   WBC 6.5 10/24/2020   HGB 13.6 10/24/2020   HCT 40.6 10/24/2020   MCV 83 10/24/2020   MCH 27.6 10/24/2020   RDW 12.5 10/24/2020   PLT 230 16/11/9602   Last metabolic panel Lab Results  Component Value Date   GLUCOSE 92 07/10/2021   NA 141 07/10/2021   K 4.8 07/10/2021   CL 101 07/10/2021   CO2 25 07/10/2021   BUN 15 07/10/2021   CREATININE 1.33 (H) 07/10/2021   EGFR 47 (L) 07/10/2021   CALCIUM 9.9 07/10/2021   PROT 7.4 07/10/2021   ALBUMIN 4.4 07/10/2021   LABGLOB 3.0 07/10/2021   AGRATIO 1.5 07/10/2021   BILITOT 0.4 07/10/2021   ALKPHOS 86 07/10/2021   AST 20 07/10/2021   ALT 13 07/10/2021   ANIONGAP 5 03/05/2016   Last lipids Lab Results  Component Value Date   CHOL 188 07/10/2021   HDL 78 07/10/2021   LDLCALC 98 07/10/2021   TRIG 66 07/10/2021   CHOLHDL 2.4 07/10/2021   Last hemoglobin A1c Lab Results  Component Value Date   HGBA1C WILL FOLLOW 07/10/2021      The 10-year ASCVD risk score (Arnett DK, et al., 2019) is: 8.2%    Assessment & Plan:   Problem List Items Addressed This  Visit       Cardiovascular and Mediastinum  Primary hypertension    controlled, eat a low salt diet, do not add any salt to food when cooking, avoid processed foods, avoid fried foods        Relevant Medications   atorvastatin (LIPITOR) 20 MG tablet   lisinopril-hydrochlorothiazide (ZESTORETIC) 20-12.5 MG tablet   Other Relevant Orders   Comprehensive metabolic panel (Completed)     Digestive   GERD without esophagitis    stable,  avoid stomach irritants (tomatoes, oranges, lemons, limes, spicy/greasy food)        Relevant Medications   pantoprazole (PROTONIX) 40 MG tablet   Other Relevant Orders   Comprehensive metabolic panel (Completed)     Endocrine   Type 2 diabetes mellitus with hyperglycemia, without long-term current use of insulin (HCC)    controlled, eat a low sugar diet, avoid starchy food with a lot of carbohydrates, avoid fried and processed foods; last hgb a1c 6.9 on 10/24/2020; hgb a1c drawn today       Relevant Medications   atorvastatin (LIPITOR) 20 MG tablet   lisinopril-hydrochlorothiazide (ZESTORETIC) 20-12.5 MG tablet   metFORMIN (GLUCOPHAGE-XR) 500 MG 24 hr tablet   Other Relevant Orders   Hemoglobin A1c (Completed)     Other   Mixed hyperlipidemia     controlled, eat a low fat diet, increase fiber intake (Benefiber or Metamucil, Cherrios,  oatmeal, beans, nuts, fruits and vegetables), limit saturated fats (in fried foods, red meat), can add OTC fish oil supplement, eat fish with Omega-3 fatty acids like salmon and tuna, exercise for 30 minutes 3 - 5 times a week, drink 8 - 10 glasses of water a day        Relevant Medications   atorvastatin (LIPITOR) 20 MG tablet   lisinopril-hydrochlorothiazide (ZESTORETIC) 20-12.5 MG tablet   Other Relevant Orders   Lipid panel (Completed)   Class 1 obesity due to excess calories with serious comorbidity and body mass index (BMI) of 33.0 to 33.9 in adult    Stable, drink 8 - 10 glasses of water daily,  limit sugar intake and eat a low fat diet, exercise 3 - 5 days a week, example walking 1 - 2 miles         Relevant Medications   metFORMIN (GLUCOPHAGE-XR) 500 MG 24 hr tablet   Other Visit Diagnoses     Leg cramp    -  Primary   Relevant Orders   Comprehensive metabolic panel (Completed) - may be caused by exercising/walking, will check CMP today       Meds ordered this encounter  Medications   atorvastatin (LIPITOR) 20 MG tablet    Sig: Take 1 tablet (20 mg total) by mouth daily.    Dispense:  90 tablet    Refill:  2    Order Specific Question:   Supervising Provider    Answer:   Denita Lung [[id number]]   lisinopril-hydrochlorothiazide (ZESTORETIC) 20-12.5 MG tablet    Sig: Take 1 tablet by mouth daily after lunch.    Dispense:  90 tablet    Refill:  2    Order Specific Question:   Supervising Provider    Answer:   Denita Lung [[id number]]   metFORMIN (GLUCOPHAGE-XR) 500 MG 24 hr tablet    Sig: Take 1 tablet (500 mg total) by mouth at bedtime.    Dispense:  90 tablet    Refill:  2    Order Specific Question:   Supervising Provider    Answer:   Denita Lung [phone]  pantoprazole (PROTONIX) 40 MG tablet    Sig: Take 1 tablet (40 mg total) by mouth daily.    Dispense:  90 tablet    Refill:  2    Order Specific Question:   Supervising Provider    Answer:   Denita Lung [[id number]]    Follow-up: Return in about 6 months (around 01/10/2022) for Return for Annual Exam with PCP Jimmye Norman.    Irene Pap, PA-C

## 2021-07-12 ENCOUNTER — Encounter: Payer: Self-pay | Admitting: Physician Assistant

## 2021-07-12 DIAGNOSIS — E6609 Other obesity due to excess calories: Secondary | ICD-10-CM | POA: Insufficient documentation

## 2021-07-12 DIAGNOSIS — K219 Gastro-esophageal reflux disease without esophagitis: Secondary | ICD-10-CM | POA: Insufficient documentation

## 2021-07-12 LAB — COMPREHENSIVE METABOLIC PANEL
ALT: 13 IU/L (ref 0–32)
AST: 20 IU/L (ref 0–40)
Albumin/Globulin Ratio: 1.5 (ref 1.2–2.2)
Albumin: 4.4 g/dL (ref 3.8–4.9)
Alkaline Phosphatase: 86 IU/L (ref 44–121)
BUN/Creatinine Ratio: 11 (ref 9–23)
BUN: 15 mg/dL (ref 6–24)
Bilirubin Total: 0.4 mg/dL (ref 0.0–1.2)
CO2: 25 mmol/L (ref 20–29)
Calcium: 9.9 mg/dL (ref 8.7–10.2)
Chloride: 101 mmol/L (ref 96–106)
Creatinine, Ser: 1.33 mg/dL — ABNORMAL HIGH (ref 0.57–1.00)
Globulin, Total: 3 g/dL (ref 1.5–4.5)
Glucose: 92 mg/dL (ref 70–99)
Potassium: 4.8 mmol/L (ref 3.5–5.2)
Sodium: 141 mmol/L (ref 134–144)
Total Protein: 7.4 g/dL (ref 6.0–8.5)
eGFR: 47 mL/min/{1.73_m2} — ABNORMAL LOW (ref >59)

## 2021-07-12 LAB — LIPID PANEL
Chol/HDL Ratio: 2.4 ratio (ref 0.0–4.4)
Cholesterol, Total: 188 mg/dL (ref 100–199)
HDL: 78 mg/dL (ref >39)
LDL Chol Calc (NIH): 98 mg/dL (ref 0–99)
Triglycerides: 66 mg/dL (ref 0–149)
VLDL Cholesterol Cal: 12 mg/dL (ref 5–40)

## 2021-07-12 LAB — HEMOGLOBIN A1C
Est. average glucose Bld gHb Est-mCnc: 157 mg/dL
Hgb A1c MFr Bld: 7.1 % — ABNORMAL HIGH (ref 4.8–5.6)

## 2021-07-12 NOTE — Assessment & Plan Note (Addendum)
controlled, eat a low sugar diet, avoid starchy food with a lot of carbohydrates, avoid fried and processed foods; last hgb a1c 6.9 on 10/24/2020; hgb a1c drawn today

## 2021-07-12 NOTE — Assessment & Plan Note (Signed)
controlled, eat a low salt diet, do not add any salt to food when cooking, avoid processed foods, avoid fried foods ? ?

## 2021-07-12 NOTE — Assessment & Plan Note (Signed)
controlled, eat a low fat diet, increase fiber intake (Benefiber or Metamucil, Cherrios,  oatmeal, beans, nuts, fruits and vegetables), limit saturated fats (in fried foods, red meat), can add OTC fish oil supplement, eat fish with Omega-3 fatty acids like salmon and tuna, exercise for 30 minutes 3 - 5 times a week, drink 8 - 10 glasses of water a day ? ? ?

## 2021-07-12 NOTE — Assessment & Plan Note (Signed)
stable,  avoid stomach irritants (tomatoes, oranges, lemons, limes, spicy/greasy food)

## 2021-07-12 NOTE — Assessment & Plan Note (Signed)
Stable, drink 8 - 10 glasses of water daily, limit sugar intake and eat a low fat diet, exercise 3 - 5 days a week, example walking 1 - 2 miles  ? ?

## 2021-08-17 ENCOUNTER — Encounter: Payer: Self-pay | Admitting: Internal Medicine

## 2021-10-24 ENCOUNTER — Encounter: Payer: Self-pay | Admitting: Internal Medicine

## 2021-11-27 ENCOUNTER — Encounter: Payer: Self-pay | Admitting: Internal Medicine

## 2021-12-27 LAB — HM DIABETES EYE EXAM

## 2021-12-31 ENCOUNTER — Ambulatory Visit: Payer: BC Managed Care – PPO | Admitting: Medical

## 2022-01-01 ENCOUNTER — Encounter: Payer: Self-pay | Admitting: Internal Medicine

## 2022-01-16 ENCOUNTER — Ambulatory Visit: Payer: 59 | Admitting: Medical

## 2022-01-16 VITALS — BP 110/70 | HR 58 | Wt 213.8 lb

## 2022-01-16 DIAGNOSIS — D509 Iron deficiency anemia, unspecified: Secondary | ICD-10-CM

## 2022-01-16 DIAGNOSIS — E1165 Type 2 diabetes mellitus with hyperglycemia: Secondary | ICD-10-CM | POA: Diagnosis not present

## 2022-01-16 DIAGNOSIS — N189 Chronic kidney disease, unspecified: Secondary | ICD-10-CM

## 2022-01-16 DIAGNOSIS — E559 Vitamin D deficiency, unspecified: Secondary | ICD-10-CM | POA: Diagnosis not present

## 2022-01-16 DIAGNOSIS — Z7185 Encounter for immunization safety counseling: Secondary | ICD-10-CM

## 2022-01-16 DIAGNOSIS — R519 Headache, unspecified: Secondary | ICD-10-CM

## 2022-01-16 DIAGNOSIS — E782 Mixed hyperlipidemia: Secondary | ICD-10-CM

## 2022-01-16 DIAGNOSIS — R109 Unspecified abdominal pain: Secondary | ICD-10-CM | POA: Diagnosis not present

## 2022-01-16 DIAGNOSIS — H9202 Otalgia, left ear: Secondary | ICD-10-CM

## 2022-01-16 DIAGNOSIS — I10 Essential (primary) hypertension: Secondary | ICD-10-CM | POA: Diagnosis not present

## 2022-01-16 LAB — POCT URINALYSIS DIP (PROADVANTAGE DEVICE)
Bilirubin, UA: NEGATIVE
Blood, UA: NEGATIVE
Glucose, UA: NEGATIVE mg/dL
Ketones, POC UA: NEGATIVE mg/dL
Leukocytes, UA: NEGATIVE
Nitrite, UA: NEGATIVE
Protein Ur, POC: NEGATIVE mg/dL
Specific Gravity, Urine: 1.01
Urobilinogen, Ur: NEGATIVE
pH, UA: 6 (ref 5.0–8.0)

## 2022-01-16 NOTE — Progress Notes (Signed)
Subjective:  Kaimana Neuzil is a 56 y.o. female who presents for Chief Complaint  Patient presents with    diabetes     Diabetes, headache x 2 weeks now at the face and ears     Diabetes - checks sugars at least twice daily.   90s fasting of late.   Taking Metformin '500mg'$  XR once daily.   No foot concerns.   Just saw eye doctor today, Dr. Harrington Challenger.    Hyperlipidemia - takes Lipitor '20mg'$  daily.   No issues with this.   HTN - compliant with lisinopril HCT 20/12.'5mg'$  daily.   She notes some headaches, in face and left ear.  Sometimes ear feels moist on left.   No recent fever, no cough, no congestion. Some achiness upper torso of late.  No rash, no fall or injury.  Sometimes gets discomfort if lying on right side.  No dysuria, no urine frequency or urgency.  No bowel changes.  No other aggravating or relieving factors.    No other c/o.  Past Medical History:  Diagnosis Date   Arthritis    neck & back    Belching 10/30/2015   Bloating 10/30/2015   Bronchitis    Chronic kidney disease (CKD)    Diabetes mellitus without complication (HCC)    External hemorrhoid    Gastroesophageal reflux disease 11/29/2015   GERD (gastroesophageal reflux disease)    GERD without esophagitis 07/10/2021   Headache    Hiatal hernia    Hypertension    Iron deficiency anemia    taking Fe TID, last hgb. 11.2, per pt.    Numbness and tingling    Paresthesia 11/27/2015   Schatzki's ring    Weight loss 10/30/2015   Current Outpatient Medications on File Prior to Visit  Medication Sig Dispense Refill   atorvastatin (LIPITOR) 20 MG tablet Take 1 tablet (20 mg total) by mouth daily. 90 tablet 2   fluticasone (FLONASE) 50 MCG/ACT nasal spray Place 2 sprays into both nostrils daily.     gabapentin (NEURONTIN) 100 MG capsule TAKE 1 CAPSULE BY MOUTH AT BEDTIME. 30 capsule 2   lisinopril-hydrochlorothiazide (ZESTORETIC) 20-12.5 MG tablet Take 1 tablet by mouth daily after lunch. 90 tablet 2   metFORMIN  (GLUCOPHAGE-XR) 500 MG 24 hr tablet Take 1 tablet (500 mg total) by mouth at bedtime. 90 tablet 2   pantoprazole (PROTONIX) 40 MG tablet Take 1 tablet (40 mg total) by mouth daily. 90 tablet 2   No current facility-administered medications on file prior to visit.     The following portions of the patient's history were reviewed and updated as appropriate: allergies, current medications, past family history, past medical history, past social history, past surgical history and problem list.  ROS Otherwise as in subjective above  Objective: BP 110/70   Pulse (!) 58   Wt 213 lb 12.8 oz (97 kg)   LMP 12/12/2015   BMI 34.51 kg/m   General appearance: alert, no distress, well developed, well nourished HEENT: normocephalic, sclerae anicteric, conjunctiva pink and moist, moderate cerumen bilaterally but there is no obvious erythema of the TMs, there is a small 2 mm erythematous lesion in the left anterior ear canal looks like a recent sore or abrasion that is trying to heal, nares patent, no discharge or erythema, pharynx normal Skin: No rash Oral cavity: MMM, no lesions Neck: supple, no lymphadenopathy, no thyromegaly, no masses Heart: RRR, normal S1, S2, no murmurs Lungs: CTA bilaterally, no wheezes, rhonchi, or rales Abdomen: +  bs, soft, mild right upper quadrant and epigastric tenderness, otherwise non tender, non distended, no masses, no hepatomegaly, no splenomegaly Pulses: 2+ radial pulses, 2+ pedal pulses, normal cap refill Ext: no edema Back nontender  Diabetic Foot Exam - Simple   Simple Foot Form Diabetic Foot exam was performed with the following findings: Yes 01/16/2022 12:05 PM  Visual Inspection No deformities, no ulcerations, no other skin breakdown bilaterally: Yes Sensation Testing Intact to touch and monofilament testing bilaterally: Yes Pulse Check Posterior Tibialis and Dorsalis pulse intact bilaterally: Yes Comments      Assessment: Encounter Diagnoses   Name Primary?   Primary hypertension Yes   Type 2 diabetes mellitus with hyperglycemia, without long-term current use of insulin (HCC)    Chronic kidney disease, unspecified CKD stage    Vitamin D deficiency    Mixed hyperlipidemia    Microcytic anemia    Vaccine counseling    Acute nonintractable headache, unspecified headache type    Left ear pain    Right sided abdominal pain      Plan: Hypertension-at goal, continue current medication  Hyperlipidemia-I reviewed the recent labs in her chart record.  Continue statin  Diabetes-updated labs today, continue current medications  Vitamin D deficiency in the past-advised over-the-counter supplement such as 1000 units daily  Left ear pain possibly due to a small sore in the left ear.  I will leave this alone for now since it does not look infected and is trying to heal  Headache-nonspecific.  Advised use over-the-counter allergy medicine for a week or 2, good hydration.  If not improving or worse over the next few weeks then recheck.  If much worse in the next week then call back or come in  Vaccine counseling: I recommend the following vaccines, Shingrix, yearly flu shot, Tdap, and Pneumococcal 23 vaccine  She just had flu and covid vaccine at [facility] last week.    We will obtain copy of vaccine info.  Alyda was seen today for  diabetes .  Diagnoses and all orders for this visit:  Primary hypertension  Type 2 diabetes mellitus with hyperglycemia, without long-term current use of insulin (HCC) -     Microalbumin/Creatinine Ratio, Urine -     Hemoglobin A1c -     Comprehensive metabolic panel  Chronic kidney disease, unspecified CKD stage  Vitamin D deficiency  Mixed hyperlipidemia  Microcytic anemia -     CBC with Differential/Platelet  Vaccine counseling  Acute nonintractable headache, unspecified headache type  Left ear pain -     CBC with Differential/Platelet -     Comprehensive metabolic panel  Right  sided abdominal pain -     CBC with Differential/Platelet -     Comprehensive metabolic panel -     POCT Urinalysis DIP (Proadvantage Device)    Follow up: Pending labs

## 2022-01-16 NOTE — Progress Notes (Signed)
Per pt had it last Saturday 01/12/22

## 2022-01-17 ENCOUNTER — Other Ambulatory Visit: Payer: Self-pay | Admitting: Medical

## 2022-01-17 DIAGNOSIS — E1140 Type 2 diabetes mellitus with diabetic neuropathy, unspecified: Secondary | ICD-10-CM

## 2022-01-17 DIAGNOSIS — M797 Fibromyalgia: Secondary | ICD-10-CM

## 2022-01-17 MED ORDER — GABAPENTIN 100 MG PO CAPS: 100.0000 mg | ORAL_CAPSULE | Freq: Every day | ORAL | 2 refills | Status: DC

## 2022-01-18 ENCOUNTER — Encounter: Payer: Self-pay | Admitting: Internal Medicine

## 2022-01-18 LAB — CBC WITH DIFFERENTIAL/PLATELET
Basophils Absolute: 0 10*3/uL (ref 0.0–0.2)
Basos: 1 %
EOS (ABSOLUTE): 0.1 10*3/uL (ref 0.0–0.4)
Eos: 2 %
Hematocrit: 40.3 % (ref 34.0–46.6)
Hemoglobin: 13.3 g/dL (ref 11.1–15.9)
Immature Grans (Abs): 0 10*3/uL (ref 0.0–0.1)
Immature Granulocytes: 0 %
Lymphocytes Absolute: 2.3 10*3/uL (ref 0.7–3.1)
Lymphs: 42 %
MCH: 28.4 pg (ref 26.6–33.0)
MCHC: 33 g/dL (ref 31.5–35.7)
MCV: 86 fL (ref 79–97)
Monocytes Absolute: 0.4 10*3/uL (ref 0.1–0.9)
Monocytes: 7 %
Neutrophils Absolute: 2.7 10*3/uL (ref 1.4–7.0)
Neutrophils: 48 %
Platelets: 212 10*3/uL (ref 150–450)
RBC: 4.68 x10E6/uL (ref 3.77–5.28)
RDW: 12.4 % (ref 11.7–15.4)
WBC: 5.5 10*3/uL (ref 3.4–10.8)

## 2022-01-18 LAB — COMPREHENSIVE METABOLIC PANEL
ALT: 11 IU/L (ref 0–32)
AST: 19 IU/L (ref 0–40)
Albumin/Globulin Ratio: 1.4 (ref 1.2–2.2)
Albumin: 4.2 g/dL (ref 3.8–4.9)
Alkaline Phosphatase: 94 IU/L (ref 44–121)
BUN/Creatinine Ratio: 11 (ref 9–23)
BUN: 14 mg/dL (ref 6–24)
Bilirubin Total: 0.4 mg/dL (ref 0.0–1.2)
CO2: 27 mmol/L (ref 20–29)
Calcium: 9.5 mg/dL (ref 8.7–10.2)
Chloride: 104 mmol/L (ref 96–106)
Creatinine, Ser: 1.23 mg/dL — ABNORMAL HIGH (ref 0.57–1.00)
Globulin, Total: 3.1 g/dL (ref 1.5–4.5)
Glucose: 104 mg/dL — ABNORMAL HIGH (ref 70–99)
Potassium: 4.9 mmol/L (ref 3.5–5.2)
Sodium: 144 mmol/L (ref 134–144)
Total Protein: 7.3 g/dL (ref 6.0–8.5)
eGFR: 52 mL/min/{1.73_m2} — ABNORMAL LOW (ref >59)

## 2022-01-18 LAB — HEMOGLOBIN A1C
Est. average glucose Bld gHb Est-mCnc: 160 mg/dL
Hgb A1c MFr Bld: 7.2 % — ABNORMAL HIGH (ref 4.8–5.6)

## 2022-01-18 LAB — MICROALBUMIN / CREATININE URINE RATIO
Creatinine, Urine: 68.7 mg/dL
Microalb/Creat Ratio: <4 mg/g creat (ref 0–29)
Microalbumin, Urine: <3 ug/mL

## 2022-02-14 ENCOUNTER — Other Ambulatory Visit: Payer: Self-pay | Admitting: Medical

## 2022-02-14 MED ORDER — MOUNJARO 2.5 MG/0.5ML ~~LOC~~ SOAJ: 2.5000 mg | SUBCUTANEOUS | 0 refills | Status: DC

## 2022-02-19 MED ORDER — MOUNJARO 2.5 MG/0.5ML ~~LOC~~ SOAJ: 2.5000 mg | SUBCUTANEOUS | 0 refills | Status: DC

## 2022-02-20 ENCOUNTER — Telehealth: Payer: 59 | Admitting: Medical

## 2022-02-20 ENCOUNTER — Encounter: Payer: Self-pay | Admitting: Medical

## 2022-02-20 VITALS — Ht 64.0 in | Wt 206.0 lb

## 2022-02-20 DIAGNOSIS — J069 Acute upper respiratory infection, unspecified: Secondary | ICD-10-CM

## 2022-02-20 MED ORDER — HYDROCODONE BIT-HOMATROP MBR 5-1.5 MG/5ML PO SOLN: 5.0000 mL | Freq: Three times a day (TID) | ORAL | 0 refills | Status: AC | PRN

## 2022-02-20 NOTE — Progress Notes (Signed)
Subjective:     Patient ID: Gabriela Lowe, female   DOB: [birth year], 57 y.o.   MRN: [id number]  This visit type was conducted due to national recommendations for restrictions regarding the COVID-19 Pandemic (e.g. social distancing) in an effort to limit this patient's exposure and mitigate transmission in our community.  Due to their co-morbid illnesses, this patient is at least at moderate risk for complications without adequate follow up.  This format is felt to be most appropriate for this patient at this time.    Documentation for virtual audio and video telecommunications through [facility] encounter:  The patient was located at home. The provider was located in the office. The patient did consent to this visit and is aware of possible charges through their insurance for this visit.  The other persons participating in this telemedicine service were none. Time spent on call was 20 minutes and in review of previous records 20 minutes total.  This virtual service is not related to other E/M service within previous 7 days.   HPI Chief Complaint  Patient presents with   other    Cough, congestion, face is hurting, ST, head ache, no fever, started Saturday   Virtual for illness.  She reports 4 day hx/o cough, congestion, facial pressure, sore throat, headaches.   No fever, some body aches , no chills.   No NVD.  No SOB or wheezing.  Has dry cough, lot of blowing nose, nasal congestion.   Congestion in throat.   Coughing up clear mucous.  Mild sore throat.   Had sick contact at church.  Did covid test 4 days ago, but it was negative.   No sudden onset.  Using dayquil, nyquil.  No hx/o lung disesae or asthma, nonsmoker.  Doing well with water intake.   No other aggravating or relieving factors. No other complaint.   Past Medical History:  Diagnosis Date   Arthritis    neck & back    Belching 10/30/2015   Bloating 10/30/2015   Bronchitis    Chronic kidney disease (CKD)     Diabetes mellitus without complication (HCC)    External hemorrhoid    Gastroesophageal reflux disease 11/29/2015   GERD (gastroesophageal reflux disease)    GERD without esophagitis 07/10/2021   Headache    Hiatal hernia    Hypertension    Iron deficiency anemia    taking Fe TID, last hgb. 11.2, per pt.    Numbness and tingling    Paresthesia 11/27/2015   Schatzki's ring    Weight loss 10/30/2015   Current Outpatient Medications on File Prior to Visit  Medication Sig Dispense Refill   atorvastatin (LIPITOR) 20 MG tablet Take 1 tablet (20 mg total) by mouth daily. 90 tablet 2   fluticasone (FLONASE) 50 MCG/ACT nasal spray Place 2 sprays into both nostrils daily.     gabapentin (NEURONTIN) 100 MG capsule Take 1 capsule (100 mg total) by mouth at bedtime. 90 capsule 2   lisinopril-hydrochlorothiazide (ZESTORETIC) 20-12.5 MG tablet Take 1 tablet by mouth daily after lunch. 90 tablet 2   metFORMIN (GLUCOPHAGE-XR) 500 MG 24 hr tablet Take 1 tablet (500 mg total) by mouth at bedtime. 90 tablet 2   pantoprazole (PROTONIX) 40 MG tablet Take 1 tablet (40 mg total) by mouth daily. 90 tablet 2   tirzepatide (MOUNJARO) 2.5 MG/0.5ML Pen Inject 2.5 mg into the skin once a week. 3 mL 0   No current facility-administered medications on file prior to visit.  Review of Systems As in subjective    Objective:   Physical Exam Due to coronavirus pandemic stay at home measures, patient visit was virtual and they were not examined in person.   Ht '5\' 4"'$  (1.626 m)   Wt 206 lb (93.4 kg)   LMP 12/12/2015   BMI 35.36 kg/m   Gen: wd, wn, nad, mildly ill-appearing No labored breathing or witnessed wheezing       Assessment:     Encounter Diagnosis  Name Primary?   Upper respiratory tract infection, unspecified type Yes       Plan:     We discussed symptoms and concerns.  She had home negative COVID test.  Her symptoms do not suggest influenza.  Her symptoms are most likely URI.  We  discussed limitations of virtual consult.  Discussed symptoms of viral respiratory tract infection and treatment recommendations.  Continue with good hydration, rest, and continuing DayQuil during the day or Mucinex DM.  At nighttime can use the Hycodan syrup sent today.  Discussed the risk and benefits of medication.  We discussed usual timeframe to see improvements which would have to be over the next few days.  However if worse within the next 48 hours such as turning to colored mucus or feeling worse wet rattly cough or worse chest congestion or worse overall doing, then recheck  Jordy was seen today for other.  Diagnoses and all orders for this visit:  Upper respiratory tract infection, unspecified type  Other orders -     HYDROcodone bit-homatropine (HYCODAN) 5-1.5 MG/5ML syrup; Take 5 mLs by mouth every 8 (eight) hours as needed for up to 5 days for cough.  Follow-up as needed

## 2022-02-24 DIAGNOSIS — M25559 Pain in unspecified hip: Secondary | ICD-10-CM

## 2022-02-25 ENCOUNTER — Other Ambulatory Visit: Payer: Self-pay | Admitting: Medical

## 2022-02-25 MED ORDER — AMOXICILLIN-POT CLAVULANATE 875-125 MG PO TABS: 1.0000 | ORAL_TABLET | Freq: Two times a day (BID) | ORAL | 0 refills | Status: DC

## 2022-03-01 ENCOUNTER — Telehealth: Payer: Self-pay | Admitting: Medical

## 2022-03-01 NOTE — Telephone Encounter (Signed)
P.A. MOUNJARO 

## 2022-03-05 NOTE — Telephone Encounter (Signed)
Medical records faxed for P.A.

## 2022-03-23 NOTE — Telephone Encounter (Signed)
P.A. approved til 03/06/23, sent mychart message

## 2022-04-04 ENCOUNTER — Other Ambulatory Visit: Payer: Self-pay | Admitting: Medical

## 2022-05-23 ENCOUNTER — Other Ambulatory Visit: Payer: Self-pay | Admitting: Medical

## 2022-08-02 ENCOUNTER — Ambulatory Visit: Payer: 59 | Admitting: Medical

## 2022-08-02 VITALS — BP 110/70 | HR 59 | Temp 97.4°F | Wt 206.2 lb

## 2022-08-02 DIAGNOSIS — N898 Other specified noninflammatory disorders of vagina: Secondary | ICD-10-CM

## 2022-08-02 DIAGNOSIS — Z113 Encounter for screening for infections with a predominantly sexual mode of transmission: Secondary | ICD-10-CM | POA: Diagnosis not present

## 2022-08-02 DIAGNOSIS — M797 Fibromyalgia: Secondary | ICD-10-CM | POA: Diagnosis not present

## 2022-08-02 DIAGNOSIS — E1140 Type 2 diabetes mellitus with diabetic neuropathy, unspecified: Secondary | ICD-10-CM | POA: Diagnosis not present

## 2022-08-02 LAB — POCT WET PREP (WET MOUNT)
Clue Cells Wet Prep Whiff POC: NEGATIVE
Trichomonas Wet Prep HPF POC: ABSENT

## 2022-08-02 MED ORDER — LISINOPRIL-HYDROCHLOROTHIAZIDE 20-12.5 MG PO TABS: 1.0000 | ORAL_TABLET | Freq: Every day | ORAL | 1 refills | Status: DC

## 2022-08-02 MED ORDER — GABAPENTIN 100 MG PO CAPS
100.0000 mg | ORAL_CAPSULE | Freq: Every day | ORAL | 2 refills | Status: DC
Start: 2022-08-02 — End: 2022-11-13

## 2022-08-02 MED ORDER — ATORVASTATIN CALCIUM 20 MG PO TABS: 20.0000 mg | ORAL_TABLET | Freq: Every day | ORAL | 1 refills | Status: DC

## 2022-08-02 MED ORDER — METFORMIN HCL ER 500 MG PO TB24: 500.0000 mg | ORAL_TABLET | Freq: Every day | ORAL | 0 refills | Status: DC

## 2022-08-02 MED ORDER — TIRZEPATIDE 5 MG/0.5ML ~~LOC~~ SOAJ: 5.0000 mg | SUBCUTANEOUS | 1 refills | Status: DC

## 2022-08-02 MED ORDER — PANTOPRAZOLE SODIUM 40 MG PO TBEC: 40.0000 mg | DELAYED_RELEASE_TABLET | Freq: Every day | ORAL | 1 refills | Status: DC

## 2022-08-02 NOTE — Progress Notes (Signed)
Subjective:  Gabriela Lowe is a 57 y.o. female who presents for Chief Complaint  Patient presents with   Vaginal Discharge    Vaginal discharge, x 1 week, itchy and irritated.   Needs refills on all meds as she has changed pharmacy     Here for vaginal discharge x 1 week, itching.  Additionally she noticed a few blisters on side of vaginal area a week ago, some tingling or burning, then turned into a sore, and gradually resolved.   Sexually active a month ago, no condoms.  Partner x 3 years, monogamous.  Last STD screen a few years ago.  No recent antibiotic, no recent hygiene product changes.    Needs refills on medicaiton.  No other aggravating or relieving factors.    No other c/o.  Past Medical History:  Diagnosis Date   Arthritis    neck & back    Belching 10/30/2015   Bloating 10/30/2015   Bronchitis    Chronic kidney disease (CKD)    Diabetes mellitus without complication (HCC)    External hemorrhoid    Gastroesophageal reflux disease 11/29/2015   GERD (gastroesophageal reflux disease)    GERD without esophagitis 07/10/2021   Headache    Hiatal hernia    Hypertension    Iron deficiency anemia    taking Fe TID, last hgb. 11.2, per pt.    Numbness and tingling    Paresthesia 11/27/2015   Schatzki's ring    Weight loss 10/30/2015   Current Outpatient Medications on File Prior to Visit  Medication Sig Dispense Refill   fluticasone (FLONASE) 50 MCG/ACT nasal spray Place 2 sprays into both nostrils daily.     No current facility-administered medications on file prior to visit.     The following portions of the patient's history were reviewed and updated as appropriate: allergies, current medications, past family history, past medical history, past social history, past surgical history and problem list.  ROS Otherwise as in subjective above  Objective: BP 110/70   Pulse (!) 59   Temp (!) 97.4 F (36.3 C)   Wt 206 lb 3.2 oz (93.5 kg)   LMP 12/12/2015    BMI 35.39 kg/m   General appearance: alert, no distress, well developed, well nourished Gyn:     Assessment: Encounter Diagnoses  Name Primary?   Vaginal discharge Yes   Fibromyalgia    Type 2 diabetes mellitus with diabetic neuropathy, without long-term current use of insulin (HCC)    Screen for STD (sexually transmitted disease)      Plan: We discussed her symptoms, vaginal discharge.  There is no obvious yeast or BV or trichomonas on her wet prep today.  Await other labs.  She is on MyChart and we will see her labs as quick as we will, so discussed her calling back if she has not heard from [place] by Monday or Tuesday.  Refilled her medicines to different pharmacy at her request.  Due for diabetes followup and physical soon.  [name] was seen today for vaginal discharge.  Diagnoses and all orders for this visit:  Vaginal discharge -     RPR+HIV+GC+CT Panel -     Hepatitis B surface antigen -     Hepatitis C antibody -     HSV 1 and 2 Ab, IgG -     POCT Wet Prep ([facility] [place])  Fibromyalgia -     gabapentin (NEURONTIN) 100 MG capsule; Take 1 capsule (100 mg total) by mouth at bedtime.  Type 2 diabetes mellitus with diabetic neuropathy, without long-term current use of insulin (HCC) -     gabapentin (NEURONTIN) 100 MG capsule; Take 1 capsule (100 mg total) by mouth at bedtime.  Screen for STD (sexually transmitted disease) -     RPR+HIV+GC+CT Panel -     Hepatitis B surface antigen -     Hepatitis C antibody -     HSV 1 and 2 Ab, IgG -     POCT Wet Prep [facility])  Other orders -     atorvastatin (LIPITOR) 20 MG tablet; Take 1 tablet (20 mg total) by mouth daily. -     lisinopril-hydrochlorothiazide (ZESTORETIC) 20-12.5 MG tablet; Take 1 tablet by mouth daily after lunch. -     metFORMIN (GLUCOPHAGE-XR) 500 MG 24 hr tablet; Take 1 tablet (500 mg total) by mouth at bedtime. -     pantoprazole (PROTONIX) 40 MG tablet; Take 1 tablet (40 mg total) by mouth daily. -      tirzepatide [facility]) 5 MG/0.5ML Pen; Inject 5 mg into the skin once a week.    Follow up: soon for physical

## 2022-08-03 LAB — RPR+HIV+GC+CT PANEL: HIV Screen 4th Generation wRfx: NONREACTIVE

## 2022-08-03 LAB — HEPATITIS B SURFACE ANTIGEN: Hepatitis B Surface Ag: NEGATIVE

## 2022-08-06 LAB — HSV 1 AND 2 AB, IGG
HSV 1 Glycoprotein G Ab, IgG: 18.3 index — ABNORMAL HIGH (ref 0.00–0.90)
HSV 2 IgG, Type Spec: >23.6 index — ABNORMAL HIGH (ref 0.00–0.90)

## 2022-08-06 LAB — HEPATITIS C ANTIBODY: Hep C Virus Ab: NONREACTIVE

## 2022-08-06 LAB — RPR+HIV+GC+CT PANEL
Chlamydia trachomatis, NAA: NEGATIVE
Neisseria Gonorrhoeae by PCR: NEGATIVE
RPR Ser Ql: NONREACTIVE

## 2022-08-11 NOTE — Progress Notes (Signed)
Make sure she reviewed the results.  Thankfully, Dr. Lynelle Doctor reviewed the results and sent her a message. See what questions she may have

## 2022-08-13 ENCOUNTER — Ambulatory Visit: Payer: 59 | Admitting: Medical

## 2022-09-03 ENCOUNTER — Ambulatory Visit: Payer: 59 | Admitting: Medical

## 2022-10-30 ENCOUNTER — Other Ambulatory Visit: Payer: Self-pay | Admitting: Medical

## 2022-11-11 ENCOUNTER — Ambulatory Visit (INDEPENDENT_AMBULATORY_CARE_PROVIDER_SITE_OTHER): Payer: 59 | Admitting: Medical

## 2022-11-11 ENCOUNTER — Encounter: Payer: Self-pay | Admitting: Medical

## 2022-11-11 VITALS — BP 110/70 | HR 58 | Ht 65.0 in | Wt 201.8 lb

## 2022-11-11 DIAGNOSIS — E1165 Type 2 diabetes mellitus with hyperglycemia: Secondary | ICD-10-CM | POA: Diagnosis not present

## 2022-11-11 DIAGNOSIS — Z0000 Encounter for general adult medical examination without abnormal findings: Secondary | ICD-10-CM

## 2022-11-11 DIAGNOSIS — E782 Mixed hyperlipidemia: Secondary | ICD-10-CM

## 2022-11-11 DIAGNOSIS — N189 Chronic kidney disease, unspecified: Secondary | ICD-10-CM

## 2022-11-11 DIAGNOSIS — I10 Essential (primary) hypertension: Secondary | ICD-10-CM

## 2022-11-11 DIAGNOSIS — E559 Vitamin D deficiency, unspecified: Secondary | ICD-10-CM

## 2022-11-11 DIAGNOSIS — K219 Gastro-esophageal reflux disease without esophagitis: Secondary | ICD-10-CM

## 2022-11-11 DIAGNOSIS — R1011 Right upper quadrant pain: Secondary | ICD-10-CM

## 2022-11-11 DIAGNOSIS — R0789 Other chest pain: Secondary | ICD-10-CM

## 2022-11-11 LAB — POCT URINALYSIS DIP (PROADVANTAGE DEVICE)
Bilirubin, UA: NEGATIVE
Blood, UA: NEGATIVE
Glucose, UA: NEGATIVE mg/dL
Ketones, POC UA: NEGATIVE mg/dL
Nitrite, UA: NEGATIVE
Protein Ur, POC: NEGATIVE mg/dL
Specific Gravity, Urine: 1.025
Urobilinogen, Ur: NEGATIVE
pH, UA: 6 (ref 5.0–8.0)

## 2022-11-11 LAB — LIPID PANEL

## 2022-11-11 NOTE — Progress Notes (Signed)
Subjective:   HPI  Gabriela Lowe is a 57 y.o. female who presents for Chief Complaint  Patient presents with   Annual Exam    Fasting cpe, still having side pain, will get flu shot at job Eye doctor- requested Obgyn- requested    Patient Care Team: Ulys Favia, Kermit Balo, PA-C as PCP - General (Family Medicine) Croitoru, Rachelle Hora, MD as PCP - Cardiology (Cardiology) Earnstine Regal, OD (Optometry) Obgyn, [facility] Erick Blinks, GI   Concerns: Diabetes-compliant with metformin XR 500 mg daily and Mounjaro 5 mg weekly  Hypertension-compliant lisinopril HCT 20/12.5 mg daily  Hyperlipidemia-compliant with atorvastatin 20 mg daily  She has had an ongoing pain in her right chest wall/right upper abdomen.  This happens fairly frequently.  No shortness of breath.  No cough.  No worse indigestion or acid reflux.  Not sure why this is going on.  Hurts to even sleep on that side.   Reviewed their medical, surgical, family, social, medication, and allergy history and updated chart as appropriate.  Allergies  Allergen Reactions   Tramadol Nausea Only and Other (See Comments)    Pt states makes her groggy and nauseous    Past Medical History:  Diagnosis Date   Arthritis    neck & back    Belching 10/30/2015   Bloating 10/30/2015   Bronchitis    Chronic kidney disease (CKD)    Diabetes mellitus without complication (HCC)    External hemorrhoid    Gastroesophageal reflux disease 11/29/2015   GERD (gastroesophageal reflux disease)    GERD without esophagitis 07/10/2021   Headache    Hiatal hernia    Hypertension    Iron deficiency anemia    taking Fe TID, last hgb. 11.2, per pt.    Numbness and tingling    Paresthesia 11/27/2015   Schatzki's ring    Weight loss 10/30/2015    Current Outpatient Medications on File Prior to Visit  Medication Sig Dispense Refill   atorvastatin (LIPITOR) 20 MG tablet Take 1 tablet (20 mg total) by mouth daily. 90 tablet 1   fluticasone  (FLONASE) 50 MCG/ACT nasal spray Place 2 sprays into both nostrils daily.     gabapentin (NEURONTIN) 100 MG capsule Take 1 capsule (100 mg total) by mouth at bedtime. 90 capsule 2   lisinopril-hydrochlorothiazide (ZESTORETIC) 20-12.5 MG tablet Take 1 tablet by mouth daily after lunch. 90 tablet 1   metFORMIN (GLUCOPHAGE-XR) 500 MG 24 hr tablet TAKE 1 TABLET(500 MG) BY MOUTH AT BEDTIME 90 tablet 0   pantoprazole (PROTONIX) 40 MG tablet Take 1 tablet (40 mg total) by mouth daily. 90 tablet 1   tirzepatide (MOUNJARO) 5 MG/0.5ML Pen Inject 5 mg into the skin once a week. 6 mL 1   No current facility-administered medications on file prior to visit.      Current Outpatient Medications:    atorvastatin (LIPITOR) 20 MG tablet, Take 1 tablet (20 mg total) by mouth daily., Disp: 90 tablet, Rfl: 1   fluticasone (FLONASE) 50 MCG/ACT nasal spray, Place 2 sprays into both nostrils daily., Disp: , Rfl:    gabapentin (NEURONTIN) 100 MG capsule, Take 1 capsule (100 mg total) by mouth at bedtime., Disp: 90 capsule, Rfl: 2   lisinopril-hydrochlorothiazide (ZESTORETIC) 20-12.5 MG tablet, Take 1 tablet by mouth daily after lunch., Disp: 90 tablet, Rfl: 1   metFORMIN (GLUCOPHAGE-XR) 500 MG 24 hr tablet, TAKE 1 TABLET(500 MG) BY MOUTH AT BEDTIME, Disp: 90 tablet, Rfl: 0   pantoprazole (PROTONIX) 40 MG  tablet, Take 1 tablet (40 mg total) by mouth daily., Disp: 90 tablet, Rfl: 1   tirzepatide (MOUNJARO) 5 MG/0.5ML Pen, Inject 5 mg into the skin once a week., Disp: 6 mL, Rfl: 1  Family History  Problem Relation Age of Onset   Hypertension Mother    Diabetes Mother    Diabetes Father    Congestive Heart Failure Father    Heart attack Father 74   Diabetes Brother    Colon cancer Neg Hx     Past Surgical History:  Procedure Laterality Date   BREAST EXCISIONAL BIOPSY Right 2017   benign   BREAST LUMPECTOMY WITH RADIOACTIVE SEED LOCALIZATION Right 09/11/2015   Procedure: RIGHT BREAST LUMPECTOMY WITH RADIOACTIVE  SEED LOCALIZATION;  Surgeon: Abigail Miyamoto, MD;  Location: [facility] OR;  Service: General;  Laterality: Right;   CESAREAN SECTION     x 2   CHOLECYSTECTOMY     ROTATOR CUFF REPAIR Right    TUBAL LIGATION       Review of Systems  Constitutional:  Negative for chills, fever, malaise/fatigue and weight loss.  HENT:  Negative for congestion, ear pain, hearing loss, sore throat and tinnitus.   Eyes:  Negative for blurred vision, pain and redness.  Respiratory:  Negative for cough, hemoptysis and shortness of breath.   Cardiovascular:  Negative for chest pain, palpitations, orthopnea, claudication and leg swelling.  Gastrointestinal:  Positive for abdominal pain. Negative for blood in stool, constipation, diarrhea, nausea and vomiting.  Genitourinary:  Negative for dysuria, flank pain, frequency, hematuria and urgency.  Musculoskeletal:  Positive for myalgias. Negative for falls and joint pain.  Skin:  Negative for itching and rash.  Neurological:  Negative for dizziness, tingling, speech change, weakness and headaches.  Endo/Heme/Allergies:  Negative for polydipsia. Does not bruise/bleed easily.  Psychiatric/Behavioral:  Negative for depression and memory loss. The patient is not nervous/anxious and does not have insomnia.        Objective:  BP 110/70   Pulse (!) 58   Ht 5\' 5"  (1.651 m)   Wt 201 lb 12.8 oz (91.5 kg)   LMP 12/12/2015   BMI 33.58 kg/m   General appearance: alert, no distress, WD/WN, African American female Skin: unremarkable HEENT: normocephalic, conjunctiva/corneas normal, sclerae anicteric, PERRLA, EOMi, nares patent, no discharge or erythema, pharynx normal Oral cavity: MMM, tongue normal, teeth normal Neck: supple, no lymphadenopathy, no thyromegaly, no masses, normal ROM, no bruits Chest: non tender, normal shape and expansion Heart: RRR, normal S1, S2, no murmurs Lungs: decreased sounds right lower fields, otherwise CTA bilaterally, no wheezes, rhonchi, or  rales Abdomen: +bs, soft, mild RUQ tendnerss, otherwise non tender, non distended, no masses, no hepatomegaly, no splenomegaly, no bruits Back: non tender, normal ROM, no scoliosis Musculoskeletal: upper extremities non tender, no obvious deformity, normal ROM throughout, lower extremities non tender, no obvious deformity, normal ROM throughout Extremities: no edema, no cyanosis, no clubbing Pulses: 2+ symmetric, upper and lower extremities, normal cap refill Neurological: alert, oriented x 3, CN2-12 intact, strength normal upper extremities and lower extremities, sensation normal throughout, DTRs 2+ throughout, no cerebellar signs, gait normal Psychiatric: normal affect, behavior normal, pleasant  Breast/gyn/rectal - deferred to gynecology     Assessment and Plan :   Encounter Diagnoses  Name Primary?   Routine general medical examination at a health care facility Yes   Mixed hyperlipidemia    Type 2 diabetes mellitus with hyperglycemia, without long-term current use of insulin (HCC)    Vitamin D deficiency  Chronic kidney disease, unspecified CKD stage    Primary hypertension    Laryngopharyngeal reflux (LPR)    GERD without esophagitis    RUQ pain    Chest wall pain      This visit was a preventative care visit, also known as wellness visit or routine physical.   Topics typically include healthy lifestyle, diet, exercise, preventative care, vaccinations, sick and well care, proper use of emergency dept and after hours care, as well as other concerns.    Separate significant issues discussed: We discussed her chest wall pain/right upper quadrant pain-decreased lung fields on the right lower.  We discussed possibly doing a chest x-ray and right upper quadrant ultrasound versus CT chest abdomen.  Follow-up pending labs  Diabetes-updated labs today, continue Mounjaro and metformin  Hyperlipidemia-continue statin, labs today  Hypertension-continue current medication, BP at  goal  History of laryngeal pharyngeal reflux and GERD-continue Protonix    General Recommendations: Continue to return yearly for your annual wellness and preventative care visits.  This gives [place] a chance to discuss healthy lifestyle, exercise, vaccinations, review your chart record, and perform screenings where appropriate.  I recommend you see your eye doctor yearly for routine vision care.  I recommend you see your dentist yearly for routine dental care including hygiene visits twice yearly.  See your gynecologist yearly for routine gynecological care.   Vaccination recommendations were reviewed Immunization History  Administered Date(s) Administered   Influenza,inj,Quad PF,6+ Mos 11/26/2019, 10/24/2020   Influenza-Unspecified 01/12/2022   PFIZER(Purple Top)SARS-COV-2 Vaccination 05/14/2019, 06/04/2019, 04/20/2020   Pfizer(Comirnaty)Fall Seasonal Vaccine 12 years and older 01/12/2022   Tdap 04/19/2022    Vaccine recommendations: She plans to get shingrix and flu at work soon.  Works at [employer].    Screening for cancer: Colon cancer screening: Scheduled for repeat colonoscopy early 2025 with Dr. Gustavo Lah   Breast cancer screening: You should perform a self breast exam monthly.   We will request copy of recent pap and mammo with gyn  Cervical cancer screening: We reviewed recommendations for pap smear screening. We will request copy of recent pap and mammo with gyn   Skin cancer screening: Check your skin regularly for new changes, growing lesions, or other lesions of concern Come in for evaluation if you have skin lesions of concern.  Lung cancer screening: If you have a greater than 20 pack year history of tobacco use, then you may qualify for lung cancer screening with a chest CT scan.   Please call your insurance company to inquire about coverage for this test.  Pancreatic cancer: no current screening test is available routinely recommended.  (Risk factors:  Smoking, overweight or obese, diabetes, chronic pancreatitis, work Nurse, mental health, Solicitor, 35 year old or greater, female greater than female, African-American, family history of pancreatic cancer, hereditary breast, ovarian, melanoma, Lynch, Peutz-jeghers).  We currently don't have screenings for other cancers besides breast, cervical, colon, and lung cancers.  If you have a strong family history of cancer or have other cancer screening concerns, please let me know.    Bone health: Get at least 150 minutes of aerobic exercise weekly Get weight bearing exercise at least once weekly Bone density test:  A bone density test is an imaging test that uses a type of X-ray to measure the amount of calcium and other minerals in your bones. The test may be used to diagnose or screen you for a condition that causes weak or thin bones (osteoporosis), predict your risk for a broken bone (fracture), or determine  how well your osteoporosis treatment is working. The bone density test is recommended for females 65 and older, or females or males <65 if certain risk factors such as thyroid disease, long term use of steroids such as for asthma or rheumatological issues, vitamin D deficiency, estrogen deficiency, family history of osteoporosis, self or family history of fragility fracture in first degree relative.    Heart health: Get at least 150 minutes of aerobic exercise weekly Limit alcohol It is important to maintain a healthy blood pressure and healthy cholesterol numbers  Heart disease screening: Screening for heart disease includes screening for blood pressure, fasting lipids, glucose/diabetes screening, BMI height to weight ratio, reviewed of smoking status, physical activity, and diet.    Goals include blood pressure 120/80 or less, maintaining a healthy lipid/cholesterol profile, preventing diabetes or keeping diabetes numbers under good control, not smoking or using tobacco products,  exercising most days per week or at least 150 minutes per week of exercise, and eating healthy variety of fruits and vegetables, healthy oils, and avoiding unhealthy food choices like fried food, fast food, high sugar and high cholesterol foods.    Other tests may possibly include EKG test, CT coronary calcium score, echocardiogram, exercise treadmill stress test.    Vascular disease screening: For high risk individuals including smokers, diabetes, patients with known heart disease or high blood pressure, kidney disease, and others, screening for vascular disease or atherosclerosis of the arteries is available.  Examples may include carotid ultrasound, abdominal aortic ultrasound, ABI blood flow screening in the legs, thoracic aorta screening.  Medical care options: I recommend you continue to seek care here first for routine care.  We try really hard to have available appointments Monday through Friday daytime hours for sick visits, acute visits, and physicals.  Urgent care should be used for after hours and weekends for significant issues that cannot wait till the next day.  The emergency department should be used for significant potentially life-threatening emergencies.  The emergency department is expensive, can often have long wait times for less significant concerns, so try to utilize primary care, urgent care, or telemedicine when possible to avoid unnecessary trips to the emergency department.  Virtual visits and telemedicine have been introduced since the pandemic started in 2020, and can be convenient ways to receive medical care.  We offer virtual appointments as well to assist you in a variety of options to seek medical care.   Legal Take the time to do a Last Will and Testament, advanced directives including Healthcare Power of Attorney and Living Will documents.  Do not leave your family with burdens that can be handled ahead of time.   Advanced Directives: I recommend you consider  completing a Health Care Power of Attorney and Living Will.   These documents respect your wishes and help alleviate burdens on your loved ones if you were to become terminally ill or be in a position to need those documents enforced.    You can complete Advanced Directives yourself, have them notarized, then have copies made for our office, for you and for anybody you feel should have them in safe keeping.  Or, you can have an attorney prepare these documents.   If you haven't updated your Last Will and Testament in a while, it may be worthwhile having an attorney prepare these documents together and save on some costs.       Spiritual and Emotional Health Keeping a healthy spiritual life can help you better manage your physical  health. Your spiritual life can help you to cope with any issues that may arise with your physical health.  Balance can keep [place] healthy and help [place] to recover.  If you are struggling with your spiritual health there are questions that you may want to ask yourself:  What makes me feel most complete? When do I feel most connected to the rest of the world? Where do I find the most inner strength? What am I doing when I feel whole?  Helpful tips: Being in nature. Some people feel very connected and at peace when they are walking outdoors or are outside. Helping others. Some feel the largest sense of wellbeing when they are of service to others. Being of service can take on many forms. It can be doing volunteer work, being kind to strangers, or offering a hand to a friend in need. Gratitude. Some people find they feel the most connected when they remain grateful. They may make lists of all the things they are grateful for or say a thank you out loud for all they have.    Emotional Health Are you in tune with your emotional health?  Check out this link: http://www.marquez-love.com/   Financial Health Make sure you use a budget for your personal finances Make  sure you are insured against risks (health insurance, life insurance, auto insurance, etc) Save more, spend less Set financial goals If you need help in this area, good resources include counseling through [employer] or other community resources, have a meeting with a Social research officer, government, and a good resource is Salley Slaughter podcast    Gabriela Lowe was seen today for annual exam.  Diagnoses and all orders for this visit:  Routine general medical examination at a health care facility -     POCT Urinalysis DIP (Proadvantage Device) -     Comprehensive metabolic panel -     CBC -     Lipid panel -     Hemoglobin A1c -     Microalbumin/Creatinine Ratio, Urine  Mixed hyperlipidemia -     Lipid panel  Type 2 diabetes mellitus with hyperglycemia, without long-term current use of insulin (HCC) -     Hemoglobin A1c -     Microalbumin/Creatinine Ratio, Urine  Vitamin D deficiency  Chronic kidney disease, unspecified CKD stage -     Comprehensive metabolic panel  Primary hypertension  Laryngopharyngeal reflux (LPR)  GERD without esophagitis  RUQ pain  Chest wall pain     Follow-up pending labs, yearly for physical

## 2022-11-12 ENCOUNTER — Other Ambulatory Visit: Payer: Self-pay | Admitting: Medical

## 2022-11-12 ENCOUNTER — Encounter: Payer: Self-pay | Admitting: Internal Medicine

## 2022-11-12 DIAGNOSIS — Z1231 Encounter for screening mammogram for malignant neoplasm of breast: Secondary | ICD-10-CM

## 2022-11-12 LAB — COMPREHENSIVE METABOLIC PANEL
ALT: 11 IU/L (ref 0–32)
AST: 19 IU/L (ref 0–40)
Albumin: 4.5 g/dL (ref 3.8–4.9)
Alkaline Phosphatase: 90 IU/L (ref 44–121)
BUN/Creatinine Ratio: 8 — ABNORMAL LOW (ref 9–23)
BUN: 11 mg/dL (ref 6–24)
Bilirubin Total: 0.7 mg/dL (ref 0.0–1.2)
CO2: 25 mmol/L (ref 20–29)
Calcium: 9.6 mg/dL (ref 8.7–10.2)
Chloride: 107 mmol/L — ABNORMAL HIGH (ref 96–106)
Creatinine, Ser: 1.37 mg/dL — ABNORMAL HIGH (ref 0.57–1.00)
Globulin, Total: 2.7 g/dL (ref 1.5–4.5)
Glucose: 88 mg/dL (ref 70–99)
Potassium: 4.5 mmol/L (ref 3.5–5.2)
Sodium: 145 mmol/L — ABNORMAL HIGH (ref 134–144)
Total Protein: 7.2 g/dL (ref 6.0–8.5)
eGFR: 45 mL/min/{1.73_m2} — ABNORMAL LOW (ref >59)

## 2022-11-12 LAB — LIPID PANEL
Cholesterol, Total: 200 mg/dL — ABNORMAL HIGH (ref 100–199)
HDL: 72 mg/dL (ref >39)
LDL CALC COMMENT:: 2.8 ratio (ref 0.0–4.4)
LDL Chol Calc (NIH): 117 mg/dL — ABNORMAL HIGH (ref 0–99)
Triglycerides: 59 mg/dL (ref 0–149)
VLDL Cholesterol Cal: 11 mg/dL (ref 5–40)

## 2022-11-12 LAB — CBC
Hematocrit: 42.1 % (ref 34.0–46.6)
Hemoglobin: 13.8 g/dL (ref 11.1–15.9)
MCH: 28.6 pg (ref 26.6–33.0)
MCHC: 32.8 g/dL (ref 31.5–35.7)
MCV: 87 fL (ref 79–97)
Platelets: 226 10*3/uL (ref 150–450)
RBC: 4.82 x10E6/uL (ref 3.77–5.28)
RDW: 12.5 % (ref 11.7–15.4)
WBC: 5.8 10*3/uL (ref 3.4–10.8)

## 2022-11-12 LAB — MICROALBUMIN / CREATININE URINE RATIO
Creatinine, Urine: 304.8 mg/dL
Microalb/Creat Ratio: 4 mg/g{creat} (ref 0–29)
Microalbumin, Urine: 11.9 ug/mL

## 2022-11-12 LAB — HEMOGLOBIN A1C
Est. average glucose Bld gHb Est-mCnc: 134 mg/dL
Hgb A1c MFr Bld: 6.3 % — ABNORMAL HIGH (ref 4.8–5.6)

## 2022-11-13 ENCOUNTER — Other Ambulatory Visit: Payer: Self-pay | Admitting: Medical

## 2022-11-13 ENCOUNTER — Encounter: Payer: Self-pay | Admitting: Internal Medicine

## 2022-11-13 DIAGNOSIS — E1140 Type 2 diabetes mellitus with diabetic neuropathy, unspecified: Secondary | ICD-10-CM

## 2022-11-13 DIAGNOSIS — M797 Fibromyalgia: Secondary | ICD-10-CM

## 2022-11-13 MED ORDER — PANTOPRAZOLE SODIUM 40 MG PO TBEC: 40.0000 mg | DELAYED_RELEASE_TABLET | Freq: Every day | ORAL | 1 refills | Status: DC

## 2022-11-13 MED ORDER — METFORMIN HCL ER 500 MG PO TB24: 500.0000 mg | ORAL_TABLET | Freq: Every day | ORAL | 1 refills | Status: DC

## 2022-11-13 MED ORDER — MOUNJARO 10 MG/0.5ML ~~LOC~~ SOAJ: 10.0000 mg | SUBCUTANEOUS | 1 refills | Status: DC

## 2022-11-13 MED ORDER — ATORVASTATIN CALCIUM 20 MG PO TABS: 20.0000 mg | ORAL_TABLET | Freq: Every day | ORAL | 2 refills | Status: DC

## 2022-11-13 MED ORDER — MOUNJARO 7.5 MG/0.5ML ~~LOC~~ SOAJ: 7.5000 mg | SUBCUTANEOUS | 0 refills | Status: DC

## 2022-11-13 MED ORDER — GABAPENTIN 100 MG PO CAPS: 100.0000 mg | ORAL_CAPSULE | Freq: Every day | ORAL | 2 refills | Status: DC

## 2022-11-13 NOTE — Progress Notes (Signed)
Results sent through MyChart

## 2022-11-14 ENCOUNTER — Encounter: Payer: Self-pay | Admitting: Internal Medicine

## 2022-11-15 ENCOUNTER — Other Ambulatory Visit: Payer: Self-pay | Admitting: Medical

## 2022-11-15 DIAGNOSIS — R1011 Right upper quadrant pain: Secondary | ICD-10-CM

## 2022-11-15 DIAGNOSIS — R0789 Other chest pain: Secondary | ICD-10-CM

## 2022-11-20 ENCOUNTER — Other Ambulatory Visit: Payer: 59

## 2022-11-21 ENCOUNTER — Ambulatory Visit
Admission: RE | Admit: 2022-11-21 | Discharge: 2022-11-21 | Disposition: A | Discharge status: Home / Self Care | Payer: 59 | Source: Ambulatory Visit | Attending: Medical | Admitting: Medical

## 2022-11-21 DIAGNOSIS — R1011 Right upper quadrant pain: Secondary | ICD-10-CM

## 2022-11-21 DIAGNOSIS — R0789 Other chest pain: Secondary | ICD-10-CM

## 2022-12-10 NOTE — Progress Notes (Signed)
Results sent through MyChart

## 2023-01-20 ENCOUNTER — Other Ambulatory Visit: Payer: Self-pay | Admitting: Medical

## 2023-01-20 MED ORDER — MOUNJARO 5 MG/0.5ML ~~LOC~~ SOAJ: 5.0000 mg | SUBCUTANEOUS | 2 refills | Status: DC

## 2023-02-07 ENCOUNTER — Ambulatory Visit (AMBULATORY_SURGERY_CENTER): Payer: 59

## 2023-02-07 VITALS — Ht 64.0 in | Wt 197.0 lb

## 2023-02-07 DIAGNOSIS — Z1211 Encounter for screening for malignant neoplasm of colon: Secondary | ICD-10-CM

## 2023-02-07 MED ORDER — PEG 3350-KCL-NA BICARB-NACL 420 G PO SOLR: 4000.0000 mL | Freq: Once | ORAL | 0 refills | Status: AC

## 2023-02-07 NOTE — Progress Notes (Signed)

## 2023-02-10 ENCOUNTER — Other Ambulatory Visit (HOSPITAL_COMMUNITY): Payer: Self-pay

## 2023-02-10 ENCOUNTER — Telehealth: Payer: Self-pay

## 2023-02-10 NOTE — Telephone Encounter (Signed)
Pharmacy Patient Advocate Encounter   Received notification from CoverMyMeds that prior authorization for {Mounjaro is required/requested.   Insurance verification completed.   The patient is insured through [facility] .   Per test claim: PA required; PA submitted to above mentioned insurance via CoverMyMeds Key/confirmation #/EOC Key: [id number]    Status is pending

## 2023-02-11 ENCOUNTER — Other Ambulatory Visit (HOSPITAL_COMMUNITY): Payer: Self-pay

## 2023-02-11 NOTE — Telephone Encounter (Signed)
Pharmacy Patient Advocate Encounter  Received notification from [facility] that Prior Authorization for Gabriela Lowe has been APPROVED to 12.23.25. Ran test claim,    This test claim was processed through [facility]- copay amounts may vary at other pharmacies due to pharmacy/plan contracts, or as the patient moves through the different stages of their insurance plan.   PA #/Case ID/Reference #: Key: [id number]

## 2023-02-21 ENCOUNTER — Encounter: Payer: Self-pay | Admitting: Internal Medicine

## 2023-02-24 ENCOUNTER — Encounter: Payer: Self-pay | Admitting: Internal Medicine

## 2023-02-24 ENCOUNTER — Ambulatory Visit (AMBULATORY_SURGERY_CENTER): Payer: 59 | Admitting: Internal Medicine

## 2023-02-24 VITALS — BP 137/77 | HR 53 | Temp 97.6°F | Resp 10 | Ht 64.0 in | Wt 197.0 lb

## 2023-02-24 DIAGNOSIS — Z1211 Encounter for screening for malignant neoplasm of colon: Secondary | ICD-10-CM | POA: Diagnosis present

## 2023-02-24 DIAGNOSIS — K648 Other hemorrhoids: Secondary | ICD-10-CM | POA: Diagnosis not present

## 2023-02-24 DIAGNOSIS — D122 Benign neoplasm of ascending colon: Secondary | ICD-10-CM

## 2023-02-24 MED ORDER — SODIUM CHLORIDE 0.9 % IV SOLN: 500.0000 mL | Freq: Once | INTRAVENOUS | Status: DC

## 2023-02-24 NOTE — Progress Notes (Signed)
 Report to PACU, RN, vss, BBS= Clear.

## 2023-02-24 NOTE — Op Note (Signed)
 [facility] Patient Name: Gabriela Lowe Procedure Date: 02/24/2023 10:49 AM MRN: [id number] Endoscopist: Gordy CHRISTELLA Starch , MD, [id number] Age: 58 Referring MD:  Date of Birth: 12-29-1965 Gender: Female Account #: [id number] Procedure:                Colonoscopy Indications:              Screening for colorectal malignant neoplasm, Last                            colonoscopy 10 years ago Medicines:                Monitored Anesthesia Care Procedure:                Pre-Anesthesia Assessment:                           - Prior to the procedure, a History and Physical                            was performed, and patient medications and                            allergies were reviewed. The patient's tolerance of                            previous anesthesia was also reviewed. The risks                            and benefits of the procedure and the sedation                            options and risks were discussed with the patient.                            All questions were answered, and informed consent                            was obtained. Prior Anticoagulants: The patient has                            taken no anticoagulant or antiplatelet agents. ASA                            Grade Assessment: II - A patient with mild systemic                            disease. After reviewing the risks and benefits,                            the patient was deemed in satisfactory condition to                            undergo the procedure.  After obtaining informed consent, the colonoscope                            was passed under direct vision. Throughout the                            procedure, the patient's blood pressure, pulse, and                            oxygen saturations were monitored continuously. The                            Olympus Scope SN [id number] was introduced through the                            anus and advanced to the the  cecum, identified by                            appendiceal orifice and ileocecal valve. The                            colonoscopy was performed without difficulty. The                            patient tolerated the procedure well. The quality                            of the bowel preparation was good. The ileocecal                            valve, appendiceal orifice, and rectum were                            photographed. Scope In: 10:52:51 AM Scope Out: 11:08:58 AM Scope Withdrawal Time: 0 hours 13 minutes 5 seconds  Total Procedure Duration: 0 hours 16 minutes 7 seconds  Findings:                 The digital rectal exam was normal.                           A 4 mm polyp was found in the ascending colon. The                            polyp was sessile. The polyp was removed with a                            cold snare. Resection and retrieval were complete.                           Internal hemorrhoids were found during                            retroflexion. The hemorrhoids were medium-sized.  The exam was otherwise without abnormality. Complications:            No immediate complications. Estimated Blood Loss:     Estimated blood loss: none. Impression:               - One 4 mm polyp in the ascending colon, removed                            with a cold snare. Resected and retrieved.                           - Internal hemorrhoids.                           - The examination was otherwise normal. Recommendation:           - Patient has a contact number available for                            emergencies. The signs and symptoms of potential                            delayed complications were discussed with the                            patient. Return to normal activities tomorrow.                            Written discharge instructions were provided to the                            patient.                           - Resume previous  diet.                           - Continue present medications.                           - Await pathology results.                           - Repeat colonoscopy is recommended. The                            colonoscopy date will be determined after pathology                            results from today's exam become available for                            review. Gordy CHRISTELLA Starch, MD 02/24/2023 11:11:04 AM This report has been signed electronically.

## 2023-02-24 NOTE — Progress Notes (Signed)
 GASTROENTEROLOGY PROCEDURE H&P NOTE   Primary Care Physician: Bulah Alm RAMAN, PA-C    Reason for Procedure:  Colon cancer screening  Plan:    Colonoscopy  Patient is appropriate for endoscopic procedure(s) in the ambulatory ([facility]) setting.  The nature of the procedure, as well as the risks, benefits, and alternatives were carefully and thoroughly reviewed with the patient. Ample time for discussion and questions allowed. The patient understood, was satisfied, and agreed to proceed.     HPI: Gabriela Lowe is a 58 y.o. female who presents for screening colonoscopy.  Medical history as below.  Tolerated the prep.  No recent chest pain or shortness of breath.  No abdominal pain today.  Past Medical History:  Diagnosis Date   Arthritis    neck & back    Belching 10/30/2015   Bloating 10/30/2015   Bronchitis    Chronic kidney disease (CKD)    Diabetes mellitus without complication (HCC)    External hemorrhoid    Gastroesophageal reflux disease 11/29/2015   GERD (gastroesophageal reflux disease)    GERD without esophagitis 07/10/2021   Headache    Hiatal hernia    Hypertension    Iron  deficiency anemia    taking Fe TID, last hgb. 11.2, per pt.    Numbness and tingling    Paresthesia 11/27/2015   Schatzki's ring    Weight loss 10/30/2015    Past Surgical History:  Procedure Laterality Date   BREAST EXCISIONAL BIOPSY Right 2017   benign   BREAST LUMPECTOMY WITH RADIOACTIVE SEED LOCALIZATION Right 09/11/2015   Procedure: RIGHT BREAST LUMPECTOMY WITH RADIOACTIVE SEED LOCALIZATION;  Surgeon: Vicenta Poli, MD;  Location: [facility] OR;  Service: General;  Laterality: Right;   CESAREAN SECTION     x 2   CHOLECYSTECTOMY     ROTATOR CUFF REPAIR Right    TUBAL LIGATION      Prior to Admission medications   Medication Sig Start Date End Date Taking? Authorizing Provider  acetaminophen  (TYLENOL ) 500 MG tablet Take by mouth. 07/20/15   [provider]  atorvastatin   (LIPITOR) 20 MG tablet Take 1 tablet (20 mg total) by mouth daily. 11/13/22   Tysinger, Alm RAMAN, PA-C  fluticasone  (FLONASE ) 50 MCG/ACT nasal spray Place 2 sprays into both nostrils daily. 02/27/16   [provider]  gabapentin  (NEURONTIN ) 100 MG capsule Take 1 capsule (100 mg total) by mouth at bedtime. 11/13/22   Tysinger, Alm RAMAN, PA-C  lisinopril -hydrochlorothiazide  (ZESTORETIC ) 20-12.5 MG tablet Take 1 tablet by mouth daily after lunch. Patient not taking: Reported on 02/07/2023 08/02/22   Tysinger, Alm RAMAN, PA-C  metFORMIN  (GLUCOPHAGE -XR) 500 MG 24 hr tablet Take 1 tablet (500 mg total) by mouth daily with breakfast. 11/13/22   Tysinger, Alm RAMAN, PA-C  Multiple Vitamin (MULTIVITAMIN ADULT PO) Take by mouth. 07/20/15   [provider]  pantoprazole  (PROTONIX ) 40 MG tablet Take 1 tablet (40 mg total) by mouth daily. 11/13/22   Tysinger, Alm RAMAN, PA-C  tirzepatide  (MOUNJARO ) 5 MG/0.5ML Pen Inject 5 mg into the skin once a week. 01/20/23   Tysinger, Alm RAMAN, PA-C    Current Outpatient Medications  Medication Sig Dispense Refill   acetaminophen  (TYLENOL ) 500 MG tablet Take by mouth.     atorvastatin  (LIPITOR) 20 MG tablet Take 1 tablet (20 mg total) by mouth daily. 90 tablet 2   fluticasone  (FLONASE ) 50 MCG/ACT nasal spray Place 2 sprays into both nostrils daily.     gabapentin  (NEURONTIN ) 100 MG capsule Take  1 capsule (100 mg total) by mouth at bedtime. 90 capsule 2   lisinopril -hydrochlorothiazide  (ZESTORETIC ) 20-12.5 MG tablet Take 1 tablet by mouth daily after lunch. (Patient not taking: Reported on 02/07/2023) 90 tablet 1   metFORMIN  (GLUCOPHAGE -XR) 500 MG 24 hr tablet Take 1 tablet (500 mg total) by mouth daily with breakfast. 90 tablet 1   Multiple Vitamin (MULTIVITAMIN ADULT PO) Take by mouth.     pantoprazole  (PROTONIX ) 40 MG tablet Take 1 tablet (40 mg total) by mouth daily. 90 tablet 1   tirzepatide  (MOUNJARO ) 5 MG/0.5ML Pen Inject 5 mg into the skin once a week. 2 mL 2    Current Facility-Administered Medications  Medication Dose Route Frequency Provider Last Rate Last Admin   0.9 %  sodium chloride  infusion  500 mL Intravenous Once Layci Stenglein, Gordy HERO, MD        Allergies as of 02/24/2023 - Review Complete 02/24/2023  Allergen Reaction Noted   Tramadol  Nausea Only and Other (See Comments) 10/31/2015    Family History  Problem Relation Age of Onset   Hypertension Mother    Diabetes Mother    Diabetes Father    Congestive Heart Failure Father    Heart attack Father 77   Diabetes Brother    Colon cancer Neg Hx    Colon polyps Neg Hx    Esophageal cancer Neg Hx    Rectal cancer Neg Hx    Stomach cancer Neg Hx     Social History   Socioeconomic History   Marital status: Single    Spouse name: Not on file   Number of children: 2   Years of education: 3 years college   Highest education level: Not on file  Occupational History   Occupation: Training And Development Officer: [employer]  Tobacco Use   Smoking status: Never   Smokeless tobacco: Never  Vaping Use   Vaping status: Never Used  Substance and Sexual Activity   Alcohol use: No   Drug use: No   Sexual activity: Not on file  Other Topics Concern   Not on file  Social History Narrative   Lives at home with her son.  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     Education: 3 years of college.    Right-handed.   No caffeine .    10/2022   Social Drivers of Corporate Investment Banker Strain: Not on file  Food Insecurity: Not on file  Transportation Needs: Not on file  Physical Activity: Not on file  Stress: Not on file  Social Connections: Not on file  Intimate Partner Violence: Not on file    Physical Exam: Vital signs in last 24 hours: @BP  (!) 154/91   Temp 97.6 F (36.4 C) (Other (Comment)) Comment (Src): forehead  Ht 5' 4 (1.626 m)   Wt 197 lb (89.4 kg)   LMP 12/12/2015   SpO2 99%   BMI 33.81 kg/m  GEN: NAD EYE: Sclerae anicteric ENT:  MMM CV: Non-tachycardic Pulm: CTA b/l GI: Soft, NT/ND NEURO:  Alert & Oriented x 3   Gordy Starch, MD [facility]  02/24/2023 10:33 AM

## 2023-02-24 NOTE — Patient Instructions (Signed)
 Thank you for letting us care for your healthcare needs today! Please see handouts regarding Polyps and Hemorrhoids.  YOU HAD AN ENDOSCOPIC PROCEDURE TODAY AT THE [facility]:   Refer to the procedure report that was given to you for any specific questions about what was found during the examination.  If the procedure report does not answer your questions, please call your gastroenterologist to clarify.  If you requested that your care partner not be given the details of your procedure findings, then the procedure report has been included in a sealed envelope for you to review at your convenience later.  YOU SHOULD EXPECT: Some feelings of bloating in the abdomen. Passage of more gas than usual.  Walking can help get rid of the air that was put into your GI tract during the procedure and reduce the bloating. If you had a lower endoscopy (such as a colonoscopy or flexible sigmoidoscopy) you may notice spotting of blood in your stool or on the toilet paper. If you underwent a bowel prep for your procedure, you may not have a normal bowel movement for a few days.  Please Note:  You might notice some irritation and congestion in your nose or some drainage.  This is from the oxygen used during your procedure.  There is no need for concern and it should clear up in a day or so.  SYMPTOMS TO REPORT IMMEDIATELY:  Following lower endoscopy (colonoscopy or flexible sigmoidoscopy):  Excessive amounts of blood in the stool  Significant tenderness or worsening of abdominal pains  Swelling of the abdomen that is new, acute  Fever of 100F or higher  For urgent or emergent issues, a gastroenterologist can be reached at any hour by calling [phone]. Do not use MyChart messaging for urgent concerns.    DIET:  We do recommend a small meal at first, but then you may proceed to your regular diet.  Drink plenty of fluids but you should avoid alcoholic beverages for 24 hours.  ACTIVITY:  You  should plan to take it easy for the rest of today and you should NOT DRIVE or use heavy machinery until tomorrow (because of the sedation medicines used during the test).    FOLLOW UP: Our staff will call the number listed on your records the next business day following your procedure.  We will call around 7:15- 8:00 am to check on you and address any questions or concerns that you may have regarding the information given to you following your procedure. If we do not reach you, we will leave a message.     If any biopsies were taken you will be contacted by phone or by letter within the next 1-3 weeks.  Please call us at [phone] if you have not heard about the biopsies in 3 weeks.    SIGNATURES/CONFIDENTIALITY: You and/or your care partner have signed paperwork which will be entered into your electronic medical record.  These signatures attest to the fact that that the information above on your After Visit Summary has been reviewed and is understood.  Full responsibility of the confidentiality of this discharge information lies with you and/or your care-partner.

## 2023-02-24 NOTE — Progress Notes (Signed)
 Called to room to assist during endoscopic procedure.  Patient ID and intended procedure confirmed with present staff. Received instructions for my participation in the procedure from the performing physician.

## 2023-02-25 ENCOUNTER — Telehealth: Payer: Self-pay

## 2023-02-25 NOTE — Telephone Encounter (Signed)
  Follow up Call-     02/24/2023   10:27 AM  Call back number  Post procedure Call Back phone  # [phone]  Permission to leave phone message Yes     Patient questions:  Do you have a fever, pain , or abdominal swelling? No. Pain Score  0 *  Have you tolerated food without any problems? yes  Have you been able to return to your normal activities? Yes.    Do you have any questions about your discharge instructions: Diet   No. Medications  No. Follow up visit  No.  Do you have questions or concerns about your Care? No.  Actions: * If pain score is 4 or above: No action needed, pain <4.

## 2023-02-26 ENCOUNTER — Encounter: Payer: Self-pay | Admitting: Internal Medicine

## 2023-02-26 LAB — SURGICAL PATHOLOGY

## 2023-02-28 ENCOUNTER — Ambulatory Visit
Admission: RE | Admit: 2023-02-28 | Discharge: 2023-02-28 | Disposition: A | Discharge status: Home / Self Care | Payer: 59 | Source: Ambulatory Visit | Attending: Medical | Admitting: Medical

## 2023-02-28 DIAGNOSIS — Z1231 Encounter for screening mammogram for malignant neoplasm of breast: Secondary | ICD-10-CM

## 2023-03-10 ENCOUNTER — Other Ambulatory Visit: Payer: Self-pay | Admitting: Medical

## 2023-03-28 ENCOUNTER — Encounter: Payer: Self-pay | Admitting: Nurse Practitioner

## 2023-03-28 ENCOUNTER — Ambulatory Visit: Payer: 59 | Admitting: Nurse Practitioner

## 2023-03-28 VITALS — BP 120/82 | HR 59 | Wt 203.8 lb

## 2023-03-28 DIAGNOSIS — H66003 Acute suppurative otitis media without spontaneous rupture of ear drum, bilateral: Secondary | ICD-10-CM | POA: Diagnosis not present

## 2023-03-28 DIAGNOSIS — J0140 Acute pansinusitis, unspecified: Secondary | ICD-10-CM | POA: Insufficient documentation

## 2023-03-28 MED ORDER — FLUCONAZOLE 150 MG PO TABS: ORAL_TABLET | ORAL | 2 refills | Status: DC

## 2023-03-28 MED ORDER — AMOXICILLIN-POT CLAVULANATE 875-125 MG PO TABS: 1.0000 | ORAL_TABLET | Freq: Two times a day (BID) | ORAL | 0 refills | Status: DC

## 2023-03-28 MED ORDER — PREDNISONE 20 MG PO TABS: ORAL_TABLET | ORAL | 0 refills | Status: DC

## 2023-03-28 NOTE — Assessment & Plan Note (Signed)
 Presents with sinus pain, pressure, ear pain, and throat irritation for two weeks. No fever or body aches. Examination reveals significant ear infection. Likely acute sinusitis, common during flu season. Discussed antibiotics and steroids. Explained antibiotics typically improve symptoms but congestion may persist. Steroids can expedite symptom relief. Advised to contact if symptoms worsen or do not improve. - Prescribe Augmentin  875 mg/125 mg, one tablet twice daily for 5-7 days - Prescribe Prednisone  taper: 40 mg daily for 3 days, 20 mg daily for 3 days, 10 mg daily for 4 days - Prescribe Fluconazole  150 mg, dispense two tablets, take one at the first sign of yeast infection and the second three days later - Advise to take Augmentin  with food to minimize stomach upset - Recommend Imodium if gastrointestinal upset occurs - Advise use of nasal sprays like Flonase  for symptom relief

## 2023-03-28 NOTE — Progress Notes (Signed)
 Gabriela FORBES Doing, DNP, AGNP-c [facility] [address] [place], [place] [address] [phone]   ACUTE VISIT- ESTABLISHED PATIENT  Blood pressure 120/82, pulse (!) 59, weight 203 lb 12.8 oz (92.4 kg), last menstrual period 12/12/2015, SpO2 99%.  Subjective:  HPI Gabriela Lowe is a 57 y.o. female presents to day for evaluation of acute concern(s).   History of Present Illness Gabriela Lowe is a 58 year old female who presents with sinus pain and pressure, ear pain, and throat irritation for two weeks.  She has been experiencing sinus pain and pressure, ear pain, and throat irritation for the past two weeks, accompanied by significant sinus drainage. She has been taking Mucinex for sinus pressure, which she finds effective. No fever or body aches are present.  She works at [employer], which exposes her to various illnesses, especially during the current flu season. She notes that this season has been particularly severe.  No underlying asthma is reported, and she sleeps well. She has a known allergy to tramadol  and experiences yeast infections when taking antibiotics. Augmentin  has previously upset her stomach, but she manages this by taking it with food.  ROS negative except for what is listed in HPI. History, Medications, Surgery, SDOH, and Family History reviewed and updated as appropriate.  Objective:  Physical Exam Vitals and nursing note reviewed.  Constitutional:      General: She is not in acute distress.    Appearance: She is ill-appearing.  HENT:     Right Ear: Drainage, swelling and tenderness present. A middle ear effusion is present.     Left Ear: Drainage, swelling and tenderness present. A middle ear effusion is present.     Nose: Mucosal edema, congestion and rhinorrhea present.     Right Sinus: Maxillary sinus tenderness and frontal sinus tenderness present.     Left Sinus: Maxillary sinus tenderness and frontal sinus tenderness  present.     Mouth/Throat:     Pharynx: Posterior oropharyngeal erythema present.  Eyes:     Conjunctiva/sclera: Conjunctivae normal.  Cardiovascular:     Rate and Rhythm: Normal rate and regular rhythm.     Pulses: Normal pulses.     Heart sounds: Normal heart sounds.  Pulmonary:     Effort: Pulmonary effort is normal.     Breath sounds: Normal breath sounds.  Lymphadenopathy:     Cervical: Cervical adenopathy present.  Skin:    General: Skin is warm and dry.     Capillary Refill: Capillary refill takes less than 2 seconds.  Neurological:     General: No focal deficit present.     Mental Status: She is alert.  Psychiatric:        Mood and Affect: Mood normal.         Assessment & Plan:   Problem List Items Addressed This Visit     Acute non-recurrent pansinusitis - Primary   Presents with sinus pain, pressure, ear pain, and throat irritation for two weeks. No fever or body aches. Examination reveals significant ear infection. Likely acute sinusitis, common during flu season. Discussed antibiotics and steroids. Explained antibiotics typically improve symptoms but congestion may persist. Steroids can expedite symptom relief. Advised to contact if symptoms worsen or do not improve. - Prescribe Augmentin  875 mg/125 mg, one tablet twice daily for 5-7 days - Prescribe Prednisone  taper: 40 mg daily for 3 days, 20 mg daily for 3 days, 10 mg daily for 4 days - Prescribe Fluconazole  150 mg, dispense two tablets,  take one at the first sign of yeast infection and the second three days later - Advise to take Augmentin  with food to minimize stomach upset - Recommend Imodium if gastrointestinal upset occurs - Advise use of nasal sprays like Flonase  for symptom relief      Relevant Medications   amoxicillin -clavulanate (AUGMENTIN ) 875-125 MG tablet   fluconazole  (DIFLUCAN ) 150 MG tablet   predniSONE  (DELTASONE ) 20 MG tablet   Other Visit Diagnoses       Non-recurrent acute suppurative  otitis media of both ears without spontaneous rupture of tympanic membranes       Relevant Medications   amoxicillin -clavulanate (AUGMENTIN ) 875-125 MG tablet   fluconazole  (DIFLUCAN ) 150 MG tablet   predniSONE  (DELTASONE ) 20 MG tablet         Gabriela FORBES Doing, DNP, AGNP-c

## 2023-03-31 ENCOUNTER — Encounter: Payer: Self-pay | Admitting: Nurse Practitioner

## 2023-03-31 ENCOUNTER — Other Ambulatory Visit: Payer: Self-pay

## 2023-03-31 ENCOUNTER — Telehealth: Payer: Self-pay

## 2023-03-31 ENCOUNTER — Other Ambulatory Visit (HOSPITAL_COMMUNITY): Payer: Self-pay

## 2023-03-31 MED ORDER — MOUNJARO 5 MG/0.5ML ~~LOC~~ SOAJ: 5.0000 mg | SUBCUTANEOUS | 1 refills | Status: DC

## 2023-03-31 NOTE — Telephone Encounter (Signed)
 Please see pt previous msg about a 90 day supply   (I have received a request to do a Prior Authorization for the pts Mounjaro . After speaking with [facility] I was informed by the Pharmacist on duty that [facility] no longer over a 90 day supply of medications-per their new policy) and also the pts plan would only allow her to pick up 3 fills of medications at retail pharmacies) and from now on she must do 90 day supplies. The pt will need to choose another pharmacy outside of [facility] to fill her rx's or look into home delivery.

## 2023-03-31 NOTE — Telephone Encounter (Signed)
 Update:  [facility] can fill 90day supply of medications but with GLP1S they differ. I wasn't correctly informed. However this pt still has filled at her Max for glp1s at [facility] and must find an alternate pharmacy.

## 2023-04-01 ENCOUNTER — Other Ambulatory Visit: Payer: Self-pay

## 2023-04-01 MED ORDER — MOUNJARO 5 MG/0.5ML ~~LOC~~ SOAJ: 5.0000 mg | SUBCUTANEOUS | 1 refills | Status: DC

## 2023-08-04 ENCOUNTER — Ambulatory Visit: Payer: Self-pay

## 2023-08-04 NOTE — Telephone Encounter (Signed)
 FYI Only or Action Required?: FYI only for provider  Patient was last seen in primary care on 03/28/2023 by Early, Adriane Albe, NP. Called Nurse Triage reporting Back Pain. Symptoms began several days ago. Interventions attempted: OTC medications: tylenol /heating pads. Symptoms are: unchanged.  Triage Disposition: See PCP When Office is Open (Within 3 Days)  Patient/caregiver understands and will follow disposition?: Yes    Copied from CRM [id number]. Topic: Clinical - Red Word Triage >> Aug 04, 2023 10:11 AM Kevelyn M wrote: Back pain since Friday. It was at it's worst Friday. Upper part of her back located at the shoulder blades. Reason for Disposition  [1] MODERATE back pain (e.g., interferes with normal activities) AND [2] present > 3 days  Answer Assessment - Initial Assessment Questions 1. ONSET: When did the pain begin?      Friday 2. LOCATION: Where does it hurt? (upper, mid or lower back)     Upper back 3. SEVERITY: How bad is the pain?  (e.g., Scale 1-10; mild, moderate, or severe)   - MILD (1-3): Doesn't interfere with normal activities.    - MODERATE (4-7): Interferes with normal activities or awakens from sleep.    - SEVERE (8-10): Excruciating pain, unable to do any normal activities.      4/10 4. PATTERN: Is the pain constant? (e.g., yes, no; constant, intermittent)      Intermittent  5. RADIATION: Does the pain shoot into your legs or somewhere else?     Radiates to bilateral shoulders and neck 6. CAUSE:  What do you think is causing the back pain?      Muscular because of the work that I do 7. BACK OVERUSE:  Any recent lifting of heavy objects, strenuous work or exercise?     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 MEDICINES: What have you taken so far for the pain? (e.g., nothing, acetaminophen , NSAIDS)     Acetaminophen /heating pad with relief 9. NEUROLOGIC SYMPTOMS: Do you have any weakness, numbness, or problems with bowel/bladder control?     denies 10.  OTHER SYMPTOMS: Do you have any other symptoms? (e.g., fever, abdomen pain, burning with urination, blood in urine)       denies 11. PREGNANCY: Is there any chance you are pregnant? When was your last menstrual period?       N/a  Protocols used: Back Pain-A-AH

## 2023-08-06 NOTE — Progress Notes (Unsigned)
 No chief complaint on file.   Back pain since Friday 6/13. Pain is at the upper part of her back, by the shoulder blades.  Radiates to bilateral shoulders and neck She believes it is muscular, related to the work she does. She has high shelves at work. Tylenol  and heating pad helps    On gabapentin  Flexeril  last in 2018   PMH, PSH, SH reviewed  DM, HTN, HLD, fibromyalgia, CKD Lab Results  Component Value Date   CREATININE 1.37 (H) 11/11/2022   Lab Results  Component Value Date   HGBA1C 6.3 (H) 11/11/2022     ROS:    PHYSICAL EXAM:  LMP 12/12/2015   Wt Readings from Last 3 Encounters:  03/28/23 203 lb 12.8 oz (92.4 kg)  02/24/23 197 lb (89.4 kg)  02/07/23 197 lb (89.4 kg)       ASSESSMENT/PLAN:   Still taking metformin ? Should have run out. Mounjaro  was RF'd Mounjaro  for 3 mos with a refill in Feb (when med check was due in March)  Past due for med check with Jimmye Moulds. Had acute visit 03/2023, CPE 10/2022, and only visit scheduled is 10/2023.  F/u with Jimmye Moulds for med check

## 2023-08-07 ENCOUNTER — Encounter: Payer: Self-pay | Admitting: Family Medicine

## 2023-08-07 ENCOUNTER — Ambulatory Visit: Admitting: Family Medicine

## 2023-08-07 VITALS — BP 136/60 | HR 64 | Ht 65.0 in | Wt 213.2 lb

## 2023-08-07 DIAGNOSIS — M62838 Other muscle spasm: Secondary | ICD-10-CM

## 2023-08-07 DIAGNOSIS — N189 Chronic kidney disease, unspecified: Secondary | ICD-10-CM | POA: Diagnosis not present

## 2023-08-07 DIAGNOSIS — R0789 Other chest pain: Secondary | ICD-10-CM | POA: Diagnosis not present

## 2023-08-07 DIAGNOSIS — M797 Fibromyalgia: Secondary | ICD-10-CM | POA: Diagnosis not present

## 2023-08-07 DIAGNOSIS — E1140 Type 2 diabetes mellitus with diabetic neuropathy, unspecified: Secondary | ICD-10-CM

## 2023-08-07 MED ORDER — METHOCARBAMOL 500 MG PO TABS: 500.0000 mg | ORAL_TABLET | Freq: Three times a day (TID) | ORAL | 0 refills | Status: DC | PRN

## 2023-08-07 NOTE — Patient Instructions (Signed)
 Try and doing regular neck stretches and your prior PT home exercise regimen daily. Continue to use heat, massage and tylenol  as needed. You can also use lidocaine  patches (salonpas with lidocaine ) as needed for pain flares. We discussed using a muscle relaxant if/when muscles spasm and pain flares in the future.  We are sending in a prescription for methocarbamol (robaxin). This is one of the least sedating muscle relaxants, but use caution when first trying it. Try and avoid taking any ibuprofen  (motrin /advil ), aleve  (naproxen ), BC or Goody powder as these aren't good for the kidneys.

## 2023-08-15 ENCOUNTER — Encounter: Payer: Self-pay | Admitting: Medical

## 2023-08-21 ENCOUNTER — Ambulatory Visit: Admitting: Medical

## 2023-09-04 ENCOUNTER — Ambulatory Visit: Admitting: Medical

## 2023-09-15 ENCOUNTER — Ambulatory Visit: Admitting: Medical

## 2023-09-30 ENCOUNTER — Ambulatory Visit: Admitting: Medical

## 2023-10-14 ENCOUNTER — Other Ambulatory Visit: Payer: Self-pay | Admitting: Medical

## 2023-10-15 ENCOUNTER — Other Ambulatory Visit: Payer: Self-pay | Admitting: Medical

## 2023-10-15 MED ORDER — TIRZEPATIDE 7.5 MG/0.5ML ~~LOC~~ SOAJ: 7.5000 mg | SUBCUTANEOUS | 0 refills | Status: DC

## 2023-10-16 ENCOUNTER — Ambulatory Visit: Admitting: Medical

## 2023-11-04 ENCOUNTER — Other Ambulatory Visit: Payer: Self-pay | Admitting: Medical

## 2023-11-06 ENCOUNTER — Ambulatory Visit (INDEPENDENT_AMBULATORY_CARE_PROVIDER_SITE_OTHER): Admitting: Medical

## 2023-11-06 VITALS — BP 110/72 | HR 55 | Wt 213.6 lb

## 2023-11-06 DIAGNOSIS — Z23 Encounter for immunization: Secondary | ICD-10-CM

## 2023-11-06 DIAGNOSIS — E782 Mixed hyperlipidemia: Secondary | ICD-10-CM | POA: Diagnosis not present

## 2023-11-06 DIAGNOSIS — I10 Essential (primary) hypertension: Secondary | ICD-10-CM

## 2023-11-06 DIAGNOSIS — E1165 Type 2 diabetes mellitus with hyperglycemia: Secondary | ICD-10-CM

## 2023-11-06 DIAGNOSIS — E559 Vitamin D deficiency, unspecified: Secondary | ICD-10-CM

## 2023-11-06 DIAGNOSIS — N1831 Chronic kidney disease, stage 3a: Secondary | ICD-10-CM

## 2023-11-06 DIAGNOSIS — E1140 Type 2 diabetes mellitus with diabetic neuropathy, unspecified: Secondary | ICD-10-CM

## 2023-11-06 MED ORDER — GABAPENTIN 100 MG PO CAPS: 100.0000 mg | ORAL_CAPSULE | Freq: Every day | ORAL | 1 refills | Status: AC

## 2023-11-06 MED ORDER — LISINOPRIL-HYDROCHLOROTHIAZIDE 20-12.5 MG PO TABS: 1.0000 | ORAL_TABLET | Freq: Every day | ORAL | 1 refills | Status: DC

## 2023-11-06 MED ORDER — ATORVASTATIN CALCIUM 20 MG PO TABS: 20.0000 mg | ORAL_TABLET | Freq: Every day | ORAL | 1 refills | Status: AC

## 2023-11-06 MED ORDER — TIRZEPATIDE 10 MG/0.5ML ~~LOC~~ SOAJ: 10.0000 mg | SUBCUTANEOUS | 0 refills | Status: DC

## 2023-11-06 MED ORDER — METFORMIN HCL ER 500 MG PO TB24: 500.0000 mg | ORAL_TABLET | Freq: Every day | ORAL | 1 refills | Status: AC

## 2023-11-06 MED ORDER — PANTOPRAZOLE SODIUM 40 MG PO TBEC: 40.0000 mg | DELAYED_RELEASE_TABLET | Freq: Every day | ORAL | 0 refills | Status: DC | PRN

## 2023-11-06 NOTE — Progress Notes (Signed)
 Subjective: Chief Complaint  Patient presents with   Medical Management of Chronic Issues    Diabetic check. Still having pain on right side that's been going on for couple months. [facility] for eye exam    History of Present Illness Gabriela Lowe is a 58 year old female with hypertension, hyperlipidemia, diabetes, and chronic kidney disease stage G3A who presents for a medication check.  She is currently taking lisinopril  HCT 20/12.5 mg for blood pressure management, with home readings consistently around 125/80 mmHg and occasionally 120/75 mmHg. She is also on atorvastatin  20 mg daily for hyperlipidemia, which she tolerates well without any side effects.  For diabetes management, she is prescribed metformin  XR 500 mg once daily and Mounjaro  7.5 mg. Her blood glucose levels are well-controlled, with morning readings at 99 mg/dL and afternoon readings at 110 mg/dL.  She takes gabapentin  100 mg as needed, though the specific indication for this medication is not detailed.  Her last laboratory evaluation was a year ago, revealing a normal complete blood count, sub optimal cholesterol levels, and an A1c of 6.3%. Her renal function tests showed creatinine levels between 1.25 to 1.37 and a GFR of 45.  She underwent a mammogram and colonoscopy earlier this year. 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  She works as a Pharmacologist at [employer] and maintains an active lifestyle by walking for exercise at least 30-45 minutes, three times a week.  Past Medical History:  Diagnosis Date   Arthritis    neck & back    Belching 10/30/2015   Bloating 10/30/2015   Bronchitis    Chronic kidney disease (CKD)    Diabetes mellitus without complication (HCC)    External hemorrhoid    Gastroesophageal reflux disease 11/29/2015   GERD (gastroesophageal reflux disease)    GERD without  esophagitis 07/10/2021   Headache    Hiatal hernia    Hypertension    Iron  deficiency anemia    taking Fe TID, last hgb. 11.2, per pt.    Numbness and tingling    Paresthesia 11/27/2015   Schatzki's ring    Weight loss 10/30/2015   Current Outpatient Medications on File Prior to Visit  Medication Sig Dispense Refill   acetaminophen  (TYLENOL ) 500 MG tablet Take by mouth.     Ibuprofen -Acetaminophen  (ADVIL  DUAL ACTION) 125-250 MG TABS Take 2 tablets by mouth as needed.     Multiple Vitamin (MULTIVITAMIN ADULT PO) Take by mouth.     No current facility-administered medications on file prior to visit.   Past Surgical History:  Procedure Laterality Date   BREAST EXCISIONAL BIOPSY Right 2017   benign   BREAST LUMPECTOMY WITH RADIOACTIVE SEED LOCALIZATION Right 09/11/2015   Procedure: RIGHT BREAST LUMPECTOMY WITH RADIOACTIVE SEED LOCALIZATION;  Surgeon: Vicenta Poli, MD;  Location: [facility] OR;  Service: General;  Laterality: Right;   CESAREAN SECTION     x 2   CHOLECYSTECTOMY     ROTATOR CUFF REPAIR Right    TUBAL LIGATION      Results LABS A1c: 6.3% (2024) Creatinine: 1.25-1.37 (2024) GFR: 45 (2024)  DIAGNOSTIC Colonoscopy: Polyp, benign but precancerous, likely tubular adenoma (02/24/2023)   Objective: BP 110/72   Pulse (!) 55   Wt 213 lb 9.6 oz (96.9 kg)   LMP 12/12/2015   BMI 35.54 kg/m   General appearence: alert, no distress, WD/WN,  Neck: supple, no lymphadenopathy, no thyromegaly, no masses  Heart: RRR, normal S1, S2, no murmurs Lungs: CTA bilaterally, no wheezes, rhonchi, or rales Pulses: 2+ symmetric, upper and lower extremities, normal cap refill  Diabetic Foot Exam - Simple   Simple Foot Form Diabetic Foot exam was performed with the following findings: Yes 11/06/2023  9:23 AM  Visual Inspection No deformities, no ulcerations, no other skin breakdown bilaterally: Yes Sensation Testing Intact to touch and monofilament testing bilaterally: Yes Pulse  Check Posterior Tibialis and Dorsalis pulse intact bilaterally: Yes Comments       Assessment and Plan Encounter Diagnoses  Name Primary?   Type 2 diabetes mellitus with hyperglycemia, without long-term current use of insulin (HCC) Yes   Needs flu shot    Mixed hyperlipidemia    Vitamin D  deficiency    Primary hypertension    Stage 3a chronic kidney disease (HCC)    Type 2 diabetes mellitus with diabetic neuropathy, without long-term current use of insulin (HCC)     Type 2 diabetes mellitus Well-controlled with metformin  XR and Mounjaro . Blood sugar stable. Last A1c 6.3% a year ago. - Increasing Mounjaro  to 10 mg, 12.5 mg, or 15 mg if weight loss is a concern. Monitor for nausea, indigestion, bowel changes. - Defer blood work until next month's physical.  Chronic kidney disease, stage 3a Stage 3a CKD with GFR 45, likely due to hypertension and diabetes. - Discuss potential addition of Kerendia. - Consider nephrology referral. - Encourage hydration: 80-100 ounces daily. - Avoid NSAIDs. - Defer blood work until next [employer] physical.  Essential hypertension Well-controlled with lisinopril  HCT. Home readings stable.  Hyperlipidemia Managed with atorvastatin . Cholesterol levels expected to improve. - Defer blood work until next [employer] physical.  Counseled on the influenza virus vaccine.  Vaccine information sheet given.  Influenza vaccine given after consent obtained.     Gabriela Lowe was seen today for medical management of chronic issues.  Diagnoses and all orders for this visit:  Type 2 diabetes mellitus with hyperglycemia, without long-term current use of insulin (HCC) -     Hemoglobin A1c; Future -     Microalbumin/Creatinine Ratio, Urine; Future -     Urinalysis, Routine w reflex microscopic; Future  Needs flu shot -     Flu vaccine trivalent PF, 6mos and older(Flulaval,Afluria,Fluarix,Fluzone)  Mixed hyperlipidemia -     Hepatic function panel; Future -      Lipid panel; Future  Vitamin D  deficiency  Primary hypertension -     Urinalysis, Routine w reflex microscopic; Future  Stage 3a chronic kidney disease (HCC) -     Renal Function Panel; Future -     CBC; Future  Type 2 diabetes mellitus with diabetic neuropathy, without long-term current use of insulin (HCC) -     gabapentin  (NEURONTIN ) 100 MG capsule; Take 1 capsule (100 mg total) by mouth at bedtime.  Other orders -     metFORMIN  (GLUCOPHAGE -XR) 500 MG 24 hr tablet; Take 1 tablet (500 mg total) by mouth daily with breakfast. -     lisinopril -hydrochlorothiazide  (ZESTORETIC ) 20-12.5 MG tablet; Take 1 tablet by mouth daily after lunch. -     atorvastatin  (LIPITOR) 20 MG tablet; Take 1 tablet (20 mg total) by mouth daily. -     pantoprazole  (PROTONIX ) 40 MG tablet; Take 1 tablet (40 mg total) by mouth daily as needed. -     tirzepatide  (MOUNJARO ) 10 MG/0.5ML Pen; Inject 10 mg into the skin once a week.    F/u soon for well visit

## 2023-11-06 NOTE — Progress Notes (Signed)
 Abstracted shingrix but no pneumonia shot in [place]

## 2023-11-06 NOTE — Progress Notes (Signed)
Requested eye exam

## 2023-11-13 ENCOUNTER — Encounter: Payer: 59 | Admitting: Medical

## 2023-11-25 ENCOUNTER — Encounter: Admitting: Medical

## 2023-12-22 ENCOUNTER — Telehealth

## 2023-12-22 DIAGNOSIS — J019 Acute sinusitis, unspecified: Secondary | ICD-10-CM | POA: Diagnosis not present

## 2023-12-22 DIAGNOSIS — B9689 Other specified bacterial agents as the cause of diseases classified elsewhere: Secondary | ICD-10-CM

## 2023-12-23 MED ORDER — IPRATROPIUM BROMIDE 0.03 % NA SOLN: 2.0000 | Freq: Two times a day (BID) | NASAL | 0 refills | Status: AC

## 2023-12-23 MED ORDER — AMOXICILLIN-POT CLAVULANATE 875-125 MG PO TABS: 1.0000 | ORAL_TABLET | Freq: Two times a day (BID) | ORAL | 0 refills | Status: DC

## 2023-12-23 NOTE — Progress Notes (Signed)
 E-Visit for Sinus Problems  We are sorry that you are not feeling well.  Here is how we plan to help!  Based on what you have shared with me it looks like you have sinusitis.  Sinusitis is inflammation and infection in the sinus cavities of the head.  Based on your presentation I believe you most likely have Acute Bacterial Sinusitis.  This is an infection caused by bacteria and is treated with antibiotics. I have prescribed Augmentin  875mg /125mg  one tablet twice daily with food, for 7 days. and I have also prescribed Ipratropium Bromide Nasal Spray Use 1 spray in each nostril twice daily as needed for drainage; discontinue if too drying You may use an oral decongestant such as Mucinex D or if you have glaucoma or high blood pressure use plain Mucinex. Saline nasal spray help and can safely be used as often as needed for congestion.  If you develop worsening sinus pain, fever or notice severe headache and vision changes, or if symptoms are not better after completion of antibiotic, please schedule an appointment with a health care provider.    Sinus infections are not as easily transmitted as other respiratory infection, however we still recommend that you avoid close contact with loved ones, especially the very young and elderly.  Remember to wash your hands thoroughly throughout the day as this is the number one way to prevent the spread of infection!  Home Care: Only take medications as instructed by your medical team. Complete the entire course of an antibiotic. Do not take these medications with alcohol. A steam or ultrasonic humidifier can help congestion.  You can place a towel over your head and breathe in the steam from hot water coming from a faucet. Avoid close contacts especially the very young and the elderly. Cover your mouth when you cough or sneeze. Always remember to wash your hands.  Get Help Right Away If: You develop worsening fever or sinus pain. You develop a severe head  ache or visual changes. Your symptoms persist after you have completed your treatment plan.  Make sure you Understand these instructions. Will watch your condition. Will get help right away if you are not doing well or get worse.  Your e-visit answers were reviewed by a board certified advanced clinical practitioner to complete your personal care plan.  Depending on the condition, your plan could have included both over the counter or prescription medications.  If there is a problem please reply  once you have received a response from your provider.  Your safety is important to us .  If you have drug allergies check your prescription carefully.    You can use MyChart to ask questions about today's visit, request a non-urgent call back, or ask for a work or school excuse for 24 hours related to this e-Visit. If it has been greater than 24 hours you will need to follow up with your provider, or enter a new e-Visit to address those concerns.  You will get an e-mail in the next two days asking about your experience.  I hope that your e-visit has been valuable and will speed your recovery. Thank you for using e-visits.  I have spent 5 minutes in review of e-visit questionnaire, review and updating patient chart, medical decision making and response to patient.   Elsie Velma Lunger, PA-C

## 2023-12-30 ENCOUNTER — Telehealth: Admitting: Physician Assistant

## 2023-12-30 DIAGNOSIS — M545 Low back pain, unspecified: Secondary | ICD-10-CM

## 2023-12-30 DIAGNOSIS — R29898 Other symptoms and signs involving the musculoskeletal system: Secondary | ICD-10-CM

## 2023-12-30 NOTE — Progress Notes (Signed)
  Because of severity of back pain and weakness of lower extremities, I feel your condition warrants further evaluation and I recommend that you be seen in a face-to-face visit.   NOTE: There will be NO CHARGE for this E-Visit   If you are having a true medical emergency, please call 911.     For an urgent face to face visit, [place] has multiple urgent care centers for your convenience.  Click the link below for the full list of locations and hours, walk-in wait times, appointment scheduling options and driving directions:  Urgent Care - [place], [place], [place], [place], [place], [place]  [place]     Your MyChart E-visit questionnaire answers were reviewed by a board certified advanced clinical practitioner to complete your personal care plan based on your specific symptoms.    Thank you for using e-Visits.

## 2023-12-31 ENCOUNTER — Ambulatory Visit: Payer: Self-pay

## 2023-12-31 NOTE — Telephone Encounter (Signed)
 There is openings today if she wants to be seen for her back. Please call to see if she wants to be seen today

## 2023-12-31 NOTE — Telephone Encounter (Signed)
 FYI Only or Action Required?: FYI only for provider: appointment scheduled on 11/13.  Patient was last seen in primary care on 11/06/2023 by Bulah Alm RAMAN, PA-C.  Called Nurse Triage reporting Back Pain.  Symptoms began yesterday.  Interventions attempted: OTC medications: Tylenol .  Symptoms are: unchanged.  Triage Disposition: See HCP Within 4 Hours (Or PCP Triage)  Patient/caregiver understands and will follow disposition?: Yes         Copied from CRM [id number]. Topic: Clinical - Red Word Triage >> Dec 31, 2023  8:16 AM Carlyon D wrote: Red Word that prompted transfer to Nurse Triage: Back pain that's goes down the right leg and is very hard for pt to walk. Reason for Disposition  [1] SEVERE back pain (e.g., excruciating, unable to do any normal activities) AND [2] not improved 2 hours after pain medicine  Answer Assessment - Initial Assessment Questions 1. ONSET: When did the pain begin? (e.g., minutes, hours, days)     X 1 day   2. LOCATION: Where does it hurt? (upper, mid or lower back)     Lower back   3. SEVERITY: How bad is the pain?  (e.g., Scale 1-10; mild, moderate, or severe)     8/10  4. PATTERN: Is the pain constant? (e.g., yes, no; constant, intermittent)      Constant   5. RADIATION: Does the pain shoot into your legs or somewhere else?     Right  leg, right hip  6. CAUSE:  What do you think is causing the back pain?      Unsure  7. BACK OVERUSE:  Any recent lifting of heavy objects, strenuous work or exercise?     No   8. MEDICINES: What have you taken so far for the pain? (e.g., nothing, acetaminophen , NSAIDS)     Tylenol   9. NEUROLOGIC SYMPTOMS: Do you have any weakness, numbness, or problems with bowel/bladder control?     No   10. OTHER SYMPTOMS: Do you have any other symptoms? (e.g., fever, abdomen pain, burning with urination, blood in urine)  No.  Patient has been having back pain since yesterday, she is taking  Tylenol  for the pain. Appointment scheduled for evaluation. Patient agrees with plan of care, and will call back if anything changes, or if symptoms worsen.  Protocols used: Back Pain-A-AH

## 2024-01-01 ENCOUNTER — Encounter: Payer: Self-pay | Admitting: Family Medicine

## 2024-01-01 ENCOUNTER — Ambulatory Visit: Admitting: Family Medicine

## 2024-01-01 VITALS — BP 120/76 | HR 72 | Ht 65.0 in | Wt 215.0 lb

## 2024-01-01 DIAGNOSIS — M461 Sacroiliitis, not elsewhere classified: Secondary | ICD-10-CM | POA: Diagnosis not present

## 2024-01-01 NOTE — Progress Notes (Signed)
   Subjective:    Patient ID: Gabriela Lowe, female    DOB: 02-Jul-1965, 58 y.o.   MRN: [id number]  Discussed the use of AI scribe software for clinical note transcription with the patient, who gave verbal consent to proceed.  History of Present Illness   Gabriela Lowe is a 58 year old female who presents with right-sided back  pain.  The pain began suddenly on Wednesday morning upon getting out of bed, localized to the right side. It is exacerbated by walking, turning, and standing, especially when weight is placed on the right side. The pain causes a sensation of weakness in the area, but there is no numbness, tingling, or significant weakness in the legs or arms.  The pain has been consistent since onset. She is taking Tylenol  Arthritis Pain, 650 mg, two tablets every six hours, three times a day, which reduces the pain but does not completely relieve it. No other medications have been used for pain management.  She recalls an incident at work on Tuesday where her leg 'kind of gave out', but she did not experience any numbness. She avoids NSAIDs due to concerns about kidney function.           Review of Systems     Objective:    Physical Exam Tender to palpation over the right SI joint.  FABER test is positive.  Negative straight leg raising.  Normal hip motion.      Assessment & Plan:  Right sacroiliitis - Continue Tylenol , extra strength, two tablets three times a day. - Add Aleve , two tablets twice per day. - Apply heat to the affected area for 20 minutes, three times per day. - Perform stretching exercises after applying heat.  Reassured her that short-term use of an NSAID is going to do very little damage to her kidney function.

## 2024-01-01 NOTE — Patient Instructions (Signed)
 Continue with Tylenol  and add 2 Aleve  twice per day.  Heat to your back for 20 minutes 3 times per day and gentle stretching.  Call if continued difficulty.

## 2024-01-22 ENCOUNTER — Other Ambulatory Visit (HOSPITAL_COMMUNITY): Payer: Self-pay

## 2024-01-29 ENCOUNTER — Telehealth: Payer: Self-pay

## 2024-01-29 ENCOUNTER — Other Ambulatory Visit (HOSPITAL_COMMUNITY): Payer: Self-pay

## 2024-01-29 NOTE — Telephone Encounter (Signed)
 Pharmacy Patient Advocate Encounter   Received notification from Onbase that prior authorization for tirzepatide  (MOUNJARO ) 10 MG/0.5ML Pen  is required/requested.   Insurance verification completed.   The patient is insured through [facility].   Per test claim: PA required; PA submitted to above mentioned insurance via Latent Key/confirmation #/EOC [id number] Status is pending

## 2024-01-30 NOTE — Telephone Encounter (Signed)
 Pharmacy Patient Advocate Encounter  Received notification from [employer] that Prior Authorization for Mounjaro  10MG /0.5ML auto-injectors has been APPROVED to 12.11.26.  This test claim was processed through [facility]- copay amounts may vary at other pharmacies due to pharmacy/plan contracts, or as the patient moves through the different stages of their insurance plan.   PA #/Case ID/Reference #: [id number]

## 2024-02-16 ENCOUNTER — Other Ambulatory Visit: Payer: Self-pay | Admitting: Medical

## 2024-02-16 DIAGNOSIS — Z1231 Encounter for screening mammogram for malignant neoplasm of breast: Secondary | ICD-10-CM

## 2024-02-18 ENCOUNTER — Ambulatory Visit: Admitting: Family Medicine

## 2024-02-20 ENCOUNTER — Encounter: Payer: Self-pay | Admitting: Family Medicine

## 2024-02-20 ENCOUNTER — Ambulatory Visit: Admitting: Family Medicine

## 2024-02-20 VITALS — BP 150/106 | HR 83 | Wt 212.2 lb

## 2024-02-20 DIAGNOSIS — I10 Essential (primary) hypertension: Secondary | ICD-10-CM | POA: Diagnosis not present

## 2024-02-20 DIAGNOSIS — M461 Sacroiliitis, not elsewhere classified: Secondary | ICD-10-CM | POA: Diagnosis not present

## 2024-02-20 DIAGNOSIS — M545 Low back pain, unspecified: Secondary | ICD-10-CM

## 2024-02-20 MED ORDER — MELOXICAM 15 MG PO TABS: 15.0000 mg | ORAL_TABLET | Freq: Every day | ORAL | 0 refills | Status: DC

## 2024-02-20 NOTE — Progress Notes (Signed)
" ° °  Name: Gabriela Lowe   Date of Visit: 02/20/2024   Date of last visit with me: Visit date not found   CHIEF COMPLAINT:  Chief Complaint  Patient presents with   other    Lower back pain and lt. Leg pain, burning and dull pain on lt. Side of lt. Leg, started about a week ago,        HPI:  Discussed the use of AI scribe software for clinical note transcription with the patient, who gave verbal consent to proceed.  History of Present Illness   Gabriela Lowe is a 59 year old female who presents with lower back pain radiating to the buttocks and hip.  She has been experiencing lower back pain for about a week, which radiates to her buttocks and hip. The pain is described as a dull, aching sensation and is rated as a 6 out of 10 in severity. It is exacerbated by walking. No numbness or tingling is associated with the pain.  She has been taking Tylenol  for pain relief, but it has not been effective. The pain has been persistent and has not progressed over the past week, although she mentions experiencing similar pain for a couple of months prior to this episode.  She has a history of hypertension and is currently taking lisinopril .         OBJECTIVE:       11/06/2023    8:26 AM  Depression screen PHQ 2/9  Decreased Interest 0  Down, Depressed, Hopeless 0  PHQ - 2 Score 0     BP Readings from Last 3 Encounters:  02/20/24 (!) 150/106  01/01/24 120/76  11/06/23 110/72    BP (!) 150/106   Pulse 83   Wt 212 lb 3.2 oz (96.3 kg)   LMP 12/12/2015   BMI 35.31 kg/m         Physical Exam  Mild tenderness to palpation over the bilateral SI joint, no pain with extension, mild pain with flexion.  No pain over the greater trochanters bilaterally.  Straight leg test negative. ASSESSMENT/PLAN:   Assessment & Plan Sacroiliitis  Lumbar back pain  Primary hypertension    Assessment and Plan    Sacroiliitis Chronic lower back pain with sacroiliac joint  inflammation, unresponsive to Tylenol . - Prescribed meloxicam daily with food for two weeks. - Ordered x-rays of the back at [facility]. - Scheduled follow-up in three weeks to assess treatment response. - Consider steroid injection if no improvement after three weeks.  Primary hypertension Elevated blood pressure, possibly pain-related, on lisinopril . - Advised obtaining home blood pressure cuff for monitoring. - Reassess blood pressure management at follow-up in three weeks.         Jaritza Duignan A. Vita MD [facility] "

## 2024-02-20 NOTE — Patient Instructions (Signed)
 Please go get your xrays done at [facility]. You do not need to make an appointment. You can just show up.   Address: [address] [place], [place], [place] [address]

## 2024-02-23 ENCOUNTER — Ambulatory Visit
Admission: RE | Admit: 2024-02-23 | Discharge: 2024-02-23 | Disposition: A | Discharge status: Home / Self Care | Source: Ambulatory Visit | Attending: Family Medicine | Admitting: Family Medicine

## 2024-02-23 DIAGNOSIS — M461 Sacroiliitis, not elsewhere classified: Secondary | ICD-10-CM

## 2024-02-23 DIAGNOSIS — M545 Low back pain, unspecified: Secondary | ICD-10-CM

## 2024-03-02 ENCOUNTER — Ambulatory Visit

## 2024-03-03 ENCOUNTER — Encounter: Payer: Self-pay | Admitting: Family Medicine

## 2024-03-03 ENCOUNTER — Other Ambulatory Visit

## 2024-03-03 ENCOUNTER — Ambulatory Visit: Admitting: Family Medicine

## 2024-03-03 VITALS — BP 138/74 | HR 83 | Ht 64.0 in | Wt 211.0 lb

## 2024-03-03 DIAGNOSIS — M545 Low back pain, unspecified: Secondary | ICD-10-CM | POA: Diagnosis not present

## 2024-03-03 DIAGNOSIS — M461 Sacroiliitis, not elsewhere classified: Secondary | ICD-10-CM

## 2024-03-03 MED ORDER — LIDOCAINE HCL 1 % IJ SOLN
5.0000 mL | Freq: Once | INTRAMUSCULAR | Status: AC
  Administered 2024-03-03: 5 mL via INTRADERMAL

## 2024-03-03 MED ORDER — MELOXICAM 15 MG PO TABS: 15.0000 mg | ORAL_TABLET | Freq: Every day | ORAL | 0 refills | Status: AC

## 2024-03-03 MED ORDER — METHYLPREDNISOLONE ACETATE 40 MG/ML IJ SUSP
40.0000 mg | Freq: Once | INTRAMUSCULAR | Status: AC
  Administered 2024-03-03: 40 mg via INTRAMUSCULAR

## 2024-03-03 NOTE — Addendum Note (Signed)
 Addended by: Rona Tomson on: 03/03/2024 11:24 AM   Modules accepted: Orders

## 2024-03-03 NOTE — Progress Notes (Signed)
" ° °  Name: Gabriela Lowe   Date of Visit: 03/03/2024   Date of last visit with me: 02/20/2024   CHIEF COMPLAINT:  Chief Complaint  Patient presents with   other    2 week F/U       HPI:  Discussed the use of AI scribe software for clinical note transcription with the patient, who gave verbal consent to proceed.  History of Present Illness   Gabriela Lowe is a 59 year old female with arthritis who presents with localized back pain.  She experiences localized back pain primarily on the right side, though it is present on both sides. The pain is exacerbated by walking and is described as being in a unique spot. Initially, the pain improved with medication but returned to its previous severity two to three days ago.  She has been taking meloxicam  for pain management, which provided some relief but also caused dizziness for a couple of days. Her x-rays have shown arthritis in the back and facet arthropathy.         OBJECTIVE:       11/06/2023    8:26 AM  Depression screen PHQ 2/9  Decreased Interest 0  Down, Depressed, Hopeless 0  PHQ - 2 Score 0     BP Readings from Last 3 Encounters:  03/03/24 138/74  02/20/24 (!) 150/106  01/01/24 120/76    BP 138/74   Pulse 83   Ht 5' 4 (1.626 m)   Wt 211 lb (95.7 kg)   LMP 12/12/2015   SpO2 98%   BMI 36.22 kg/m    Physical Exam          Physical Exam Constitutional:      Appearance: Normal appearance.  Neurological:     General: No focal deficit present.     Mental Status: She is alert and oriented to person, place, and time. Mental status is at baseline.   Tenderness to palpation over the bilateral SI joints.  Positive Faber test bilaterally.  Noted pain with flexion extension of the lumbar spine area.  There is also noted point tenderness over the L5-S1 which could be facet arthropathy. ASSESSMENT/PLAN:   Assessment & Plan Sacroiliitis    Assessment and Plan    Sacroiliitis Chronic sacroiliitis  with right-sided back pain. X-rays show arthritis and facet arthropathy. Opted for steroid injection due to medication side effects and symptom recurrence. - Administered right-sided steroid injection. - Continue meloxicam  as needed. Refilled today - Patient noted improvement after injection     U/S-guided SI-joint injection, right   After discussion of risk/benefits/indications, informed verbal consent was obtained. A timeout was then performed. The patient was positioned in a prone position on exam room table with a pillow placed under the pelvis for mild hip flexion. The SI joint area was cleaned and prepped with betadine and alcohol swabs. Sterile ultrasound gel was applied and the ultrasound transducer was placed in an anatomic axial plane over the PSIS, then moved distally over the SI-joint. Using ultrasound guidance, a 22-gauge, 3.5 needle was inserted from a medial to lateral approach utilizing an in-plane approach and directed into the SI-joint. The SI-joint was then injected with a mixture of 4:2 lidocaine :depomedrol with visualization of the injectate flow into the SI-joint under ultrasound visualization. The patient tolerated the procedure well without immediate complications.     Yeraldy Spike A. Vita MD [facility] "

## 2024-03-12 ENCOUNTER — Ambulatory Visit
Admission: RE | Admit: 2024-03-12 | Discharge: 2024-03-12 | Disposition: A | Source: Ambulatory Visit | Attending: Medical | Admitting: Medical

## 2024-03-12 DIAGNOSIS — Z1231 Encounter for screening mammogram for malignant neoplasm of breast: Secondary | ICD-10-CM

## 2024-03-15 ENCOUNTER — Ambulatory Visit

## 2024-03-18 ENCOUNTER — Other Ambulatory Visit: Payer: Self-pay | Admitting: Medical

## 2024-03-22 ENCOUNTER — Ambulatory Visit: Payer: Self-pay | Admitting: Medical

## 2024-03-22 ENCOUNTER — Encounter: Payer: Self-pay | Admitting: Family Medicine

## 2024-03-23 ENCOUNTER — Ambulatory Visit: Admitting: Medical

## 2024-03-23 ENCOUNTER — Encounter: Payer: Self-pay | Admitting: Medical

## 2024-03-23 VITALS — BP 130/80 | HR 80 | Ht 64.0 in | Wt 207.6 lb

## 2024-03-23 DIAGNOSIS — N189 Chronic kidney disease, unspecified: Secondary | ICD-10-CM

## 2024-03-23 DIAGNOSIS — E1165 Type 2 diabetes mellitus with hyperglycemia: Secondary | ICD-10-CM

## 2024-03-23 DIAGNOSIS — I10 Essential (primary) hypertension: Secondary | ICD-10-CM

## 2024-03-23 MED ORDER — TIRZEPATIDE 12.5 MG/0.5ML ~~LOC~~ SOAJ: 12.5000 mg | SUBCUTANEOUS | 0 refills | Status: AC

## 2024-03-23 MED ORDER — VALSARTAN-HYDROCHLOROTHIAZIDE 320-12.5 MG PO TABS: 1.0000 | ORAL_TABLET | Freq: Every day | ORAL | 1 refills | Status: AC

## 2024-03-30 ENCOUNTER — Encounter: Payer: Self-pay | Admitting: Medical
